# Patient Record
Sex: Female | Born: 1945 | Race: Black or African American | Hispanic: No | Marital: Married | Smoking: Current every day smoker
Health system: Southern US, Community
[De-identification: ages and names within clinical notes are randomized; demographics above are authoritative.]

## PROBLEM LIST (undated history)

## (undated) DIAGNOSIS — Z9981 Dependence on supplemental oxygen: Secondary | ICD-10-CM

## (undated) DIAGNOSIS — M1990 Unspecified osteoarthritis, unspecified site: Secondary | ICD-10-CM

## (undated) DIAGNOSIS — D849 Immunodeficiency, unspecified: Secondary | ICD-10-CM

## (undated) DIAGNOSIS — N183 Chronic kidney disease, stage 3 unspecified: Secondary | ICD-10-CM

## (undated) DIAGNOSIS — I10 Essential (primary) hypertension: Secondary | ICD-10-CM

## (undated) DIAGNOSIS — E119 Type 2 diabetes mellitus without complications: Secondary | ICD-10-CM

## (undated) DIAGNOSIS — IMO0001 Reserved for inherently not codable concepts without codable children: Secondary | ICD-10-CM

## (undated) DIAGNOSIS — J189 Pneumonia, unspecified organism: Secondary | ICD-10-CM

## (undated) DIAGNOSIS — I509 Heart failure, unspecified: Secondary | ICD-10-CM

## (undated) DIAGNOSIS — J449 Chronic obstructive pulmonary disease, unspecified: Secondary | ICD-10-CM

## (undated) DIAGNOSIS — E7800 Pure hypercholesterolemia, unspecified: Secondary | ICD-10-CM

## (undated) DIAGNOSIS — I5022 Chronic systolic (congestive) heart failure: Secondary | ICD-10-CM

## (undated) HISTORY — PX: BREAST SURGERY: SHX581

---

## 2010-06-19 ENCOUNTER — Ambulatory Visit: Payer: Self-pay | Admitting: Hematology & Oncology

## 2012-09-30 ENCOUNTER — Emergency Department (HOSPITAL_COMMUNITY)
Admission: EM | Admit: 2012-09-30 | Discharge: 2012-09-30 | Disposition: A | Discharge status: Home / Self Care | Payer: PRIVATE HEALTH INSURANCE | Attending: Emergency Medicine | Admitting: Emergency Medicine

## 2012-09-30 ENCOUNTER — Emergency Department (HOSPITAL_COMMUNITY): Payer: PRIVATE HEALTH INSURANCE

## 2012-09-30 ENCOUNTER — Encounter (HOSPITAL_COMMUNITY): Payer: Self-pay | Admitting: *Deleted

## 2012-09-30 DIAGNOSIS — M25551 Pain in right hip: Secondary | ICD-10-CM

## 2012-09-30 DIAGNOSIS — E119 Type 2 diabetes mellitus without complications: Secondary | ICD-10-CM | POA: Insufficient documentation

## 2012-09-30 DIAGNOSIS — M25511 Pain in right shoulder: Secondary | ICD-10-CM

## 2012-09-30 DIAGNOSIS — M25519 Pain in unspecified shoulder: Secondary | ICD-10-CM | POA: Insufficient documentation

## 2012-09-30 DIAGNOSIS — M19019 Primary osteoarthritis, unspecified shoulder: Secondary | ICD-10-CM | POA: Insufficient documentation

## 2012-09-30 DIAGNOSIS — M25559 Pain in unspecified hip: Secondary | ICD-10-CM | POA: Insufficient documentation

## 2012-09-30 DIAGNOSIS — R209 Unspecified disturbances of skin sensation: Secondary | ICD-10-CM | POA: Insufficient documentation

## 2012-09-30 DIAGNOSIS — M129 Arthropathy, unspecified: Secondary | ICD-10-CM | POA: Insufficient documentation

## 2012-09-30 DIAGNOSIS — J449 Chronic obstructive pulmonary disease, unspecified: Secondary | ICD-10-CM | POA: Insufficient documentation

## 2012-09-30 DIAGNOSIS — J4489 Other specified chronic obstructive pulmonary disease: Secondary | ICD-10-CM | POA: Insufficient documentation

## 2012-09-30 DIAGNOSIS — F17200 Nicotine dependence, unspecified, uncomplicated: Secondary | ICD-10-CM | POA: Insufficient documentation

## 2012-09-30 HISTORY — DX: Chronic obstructive pulmonary disease, unspecified: J44.9

## 2012-09-30 HISTORY — DX: Unspecified osteoarthritis, unspecified site: M19.90

## 2012-09-30 LAB — URINALYSIS, ROUTINE W REFLEX MICROSCOPIC
Bilirubin Urine: NEGATIVE
Glucose, UA: NEGATIVE mg/dL
Hgb urine dipstick: NEGATIVE
Ketones, ur: NEGATIVE mg/dL
Nitrite: NEGATIVE
Protein, ur: NEGATIVE mg/dL
Specific Gravity, Urine: 1.013 (ref 1.005–1.030)
Urobilinogen, UA: 0.2 mg/dL (ref 0.0–1.0)
pH: 6 (ref 5.0–8.0)

## 2012-09-30 LAB — URINE MICROSCOPIC-ADD ON

## 2012-09-30 LAB — POCT I-STAT, CHEM 8
Hemoglobin: 9.9 g/dL — ABNORMAL LOW (ref 12.0–15.0)
Sodium: 140 mEq/L (ref 135–145)
TCO2: 25 mmol/L (ref 0–100)

## 2012-09-30 MED ORDER — HYDROCODONE-ACETAMINOPHEN 5-325 MG PO TABS
1.0000 | ORAL_TABLET | Freq: Once | ORAL | Status: AC
  Administered 2012-09-30: 1 via ORAL
  Filled 2012-09-30: qty 1

## 2012-09-30 MED ORDER — OXYCODONE-ACETAMINOPHEN 5-325 MG PO TABS: 1.0000 | ORAL_TABLET | Freq: Four times a day (QID) | ORAL | Status: DC | PRN

## 2012-09-30 MED ORDER — PREDNISONE 50 MG PO TABS: 50.0000 mg | ORAL_TABLET | Freq: Every day | ORAL | Status: DC

## 2012-09-30 MED ORDER — KETOROLAC TROMETHAMINE 60 MG/2ML IM SOLN
30.0000 mg | Freq: Once | INTRAMUSCULAR | Status: AC
  Administered 2012-09-30: 30 mg via INTRAMUSCULAR
  Filled 2012-09-30: qty 2

## 2012-09-30 NOTE — ED Provider Notes (Signed)
History     CSN: [id number]  Arrival date & time 09/30/12  1110   First MD Initiated Contact with Patient 09/30/12 1154      Chief Complaint  Patient presents with  . Extremity Pain    R arm/R leg    (Consider location/radiation/quality/duration/timing/severity/associated sxs/prior treatment) HPI [name] [name] is a 67 year old with past history of arthritis who presents to the ED for worsening pain in her shoulders and hips, R > L. Pain has been ongoing for 4 months, but has been getting worse. The pain is constant, 10/10, interupts her sleep, and radiates down her arms and legs. She reports her right arm feels numb. She denies any tingling or burning sensation. She states the pain is so bad sometimes that she can't walk. The pain is worst with walking or lying down. She saw Dr. Greggory Stallion in [facility] in November and was given some pain medication that didn't provide any relief. She has taken some Goody's Powder packets, which provide some relief. She also used Bengay cream, with no relief. 1 week ago she saw Dr. Greggory Stallion again and was given a prescription for the same pain medication so she did not fill it. She doesn't know the name of the pain medication she was given. Patient reports she has "two types of arthritis" and upon questioning states that it is rheumatoid arthritis and osteoarthritis. She does not take any medications for these on a daily basis. Patient is a poor historian.  Past Medical History  Diagnosis Date  . Diabetes mellitus without complication   . COPD (chronic obstructive pulmonary disease)   . Arthritis     legs, shoulders    History reviewed. No pertinent past surgical history.  No family history on file.  History  Substance Use Topics  . Smoking status: Current Every Day Smoker -- 1.00 packs/day    Types: Cigarettes  . Smokeless tobacco: Not on file  . Alcohol Use: No    OB History   Grav Para Term Preterm Abortions TAB SAB Ect Mult Living                   Review of Systems  Allergies  Review of patient's allergies indicates no known allergies.  Home Medications   Current Outpatient Rx  Name  Route  Sig  Dispense  Refill  . Aspirin-Acetaminophen-Caffeine (GOODY HEADACHE PO)   Oral   Take 2 packets by mouth 2 (two) times daily as needed (for pain).           BP 152/69  Pulse 64  Temp(Src) 98.4 F (36.9 C) (Oral)  Resp 18  SpO2 94%  Physical Exam  Constitutional: She appears well-developed and well-nourished.  HENT:  Head: Normocephalic and atraumatic.  Poor dentition.  Neck: Normal range of motion. Neck supple.  Cardiovascular: Intact distal pulses.   Musculoskeletal:  Mild diffuse tenderness to palpation of shoulders B, iliac crest of hip B, and SI joint B. Full ROM with flexion, extension, internal and external rotation, and abduction and adduction of shoulders B. Full ROM with flexion, extension, abduction, adduction, and internal and external rotation of hips B. Mild pain elicited with all movement.    ED Course  Procedures (including critical care time)  Labs Reviewed  URINALYSIS, ROUTINE W REFLEX MICROSCOPIC  patient has full range of motion of her hips and knees, along with both shoulders.  Patient has pain when doing this but does not have any limitation.  I observed the patient  walking from the bed to the bathroom without difficulty.I spoke with her primary care doctor twice while she was here in the emergency room and he felt that if there is a reason for admission but otherwise have her followup in his office. Patient will be given pain medications for home   MDM          Carlyle Dolly, PA-C 09/30/12 1605

## 2012-09-30 NOTE — ED Notes (Signed)
Pt states she was released from hosp in [place] in November.  Pt has been experiencing R sided pain (hip joint, shoulder joint) since she left the hospital.  She called her Dr at the [facility] in [place] b/c her pain increased to the point that she has been unable to sleep for the past 2 nights.  The MD stated to come to [facility] so that he could admit her.  Pt does not know the name of there MD and appears to be angry with him.  Pt poor hx.

## 2012-09-30 NOTE — ED Provider Notes (Signed)
Medical screening examination/treatment/procedure(s) were conducted as a shared visit with non-physician practitioner(s) and myself.  I personally evaluated the patient during the encounter  4 months of R shoulder and hip pain.  No fever. Abdomen soft. 5/5 strength in bilateral lower extremities. Ankle plantar and dorsiflexion intact. Great toe extension intact bilaterally. +2 DP and PT pulses. +2 patellar reflexes bilaterally. Normal gait.   Glynn Octave, MD 09/30/12 [phone]

## 2012-09-30 NOTE — ED Notes (Signed)
Pt reports she was in [place] hospital and still experiencing pain in her right hip and shoulder. Pt reports she has been taking pain medicine at home but not getting much relief. Pt reports when she lays down the pain is worse. Pt reports she was dx with arthritis. Pt reports she called her PCP told her to come to [facility] and she would be admitted today, pt is unsure why she needs to be admitted, pt reports "it must of been serious though". Pt in nad, ambulatory to room, skin warm and dry, resp e/u.

## 2013-02-03 ENCOUNTER — Telehealth: Payer: Self-pay | Admitting: Hematology & Oncology

## 2013-02-03 NOTE — Telephone Encounter (Signed)
Tried to reach patient call will not go thru. Talked with Beth at referring she confirmed number and gave sister's number [phone] a man answered said he didn't know them. I called back and Beth acted indifferent and said those's were the numbers they had. I asked to talked to a manager and [place] Sink took my call and gave me another number [phone] I left message on that line to have pt call. I asked [place] Sink to please let her MD and the nurse Helmut Muster know that Dr. Myna Hidalgo wanted to see her 7-24 and her hgb is 6. [place] Sink said she would let them know.

## 2013-02-04 ENCOUNTER — Telehealth: Payer: Self-pay | Admitting: Hematology & Oncology

## 2013-02-04 NOTE — Telephone Encounter (Signed)
Pt called to schedule appointment said she couldn't come today. Dr. Myna Hidalgo has chart and will let me know when to schedule. Pt aware I will call her back.

## 2013-02-05 ENCOUNTER — Telehealth: Payer: Self-pay | Admitting: Hematology & Oncology

## 2013-02-05 NOTE — Telephone Encounter (Signed)
Pt aware of 8-1 appointment

## 2013-02-05 NOTE — Telephone Encounter (Signed)
Asked MD about appointment for pt he said he would let me know

## 2013-02-08 ENCOUNTER — Telehealth: Payer: Self-pay | Admitting: Hematology & Oncology

## 2013-02-08 NOTE — Telephone Encounter (Signed)
[name] [name] aware pt needs precert for iron

## 2013-02-12 ENCOUNTER — Ambulatory Visit: Payer: Medicare Other | Admitting: Hematology & Oncology

## 2013-02-12 ENCOUNTER — Telehealth: Payer: Self-pay | Admitting: Hematology & Oncology

## 2013-02-12 ENCOUNTER — Ambulatory Visit: Payer: Medicare Other

## 2013-02-12 ENCOUNTER — Other Ambulatory Visit: Payer: Medicare Other | Admitting: Lab

## 2013-02-12 NOTE — Telephone Encounter (Signed)
Pt called cx 8-1 rescheduled for 9-3 left message on nurse line at referring. Dr. Myna Hidalgo aware.

## 2013-03-17 ENCOUNTER — Encounter: Payer: Medicare Other | Admitting: Hematology & Oncology

## 2013-03-17 ENCOUNTER — Telehealth: Payer: Self-pay | Admitting: Hematology & Oncology

## 2013-03-17 ENCOUNTER — Ambulatory Visit: Payer: Medicare Other

## 2013-03-17 ENCOUNTER — Other Ambulatory Visit: Payer: Medicare Other | Admitting: Lab

## 2013-03-17 NOTE — Telephone Encounter (Signed)
Pt was no show again today. Per Dr. Myna Hidalgo do not reschedule pt

## 2013-03-30 ENCOUNTER — Telehealth: Payer: Self-pay | Admitting: Hematology & Oncology

## 2013-03-30 NOTE — Telephone Encounter (Signed)
Received call from Vivien Rota [phone] pt was in the background. She wanted to reschedule appointment, I told her per Dr. Myna Hidalgo I couldn't. I suggested she call the referring MD. She asked to speak to my manager, Alvino Chapel is aware and pt is aware she will call back within an hour.

## 2013-03-30 NOTE — Telephone Encounter (Signed)
Per MD go ahead and schedule appointment. Pt aware of 04-15-13 appointment

## 2013-04-12 NOTE — Progress Notes (Signed)
This encounter was created in error - please disregard.

## 2013-04-15 ENCOUNTER — Ambulatory Visit: Payer: Medicare Other

## 2013-04-15 ENCOUNTER — Other Ambulatory Visit: Payer: Medicare Other | Admitting: Lab

## 2013-04-15 ENCOUNTER — Encounter: Payer: Medicare Other | Admitting: Hematology & Oncology

## 2013-04-16 NOTE — Progress Notes (Signed)
This encounter was created in error - please disregard.

## 2013-04-21 ENCOUNTER — Encounter: Payer: Self-pay | Admitting: *Deleted

## 2013-04-21 ENCOUNTER — Telehealth: Payer: Self-pay | Admitting: Hematology & Oncology

## 2013-04-21 NOTE — Telephone Encounter (Signed)
Pt called wanting appointment transferred to RN

## 2013-04-21 NOTE — Progress Notes (Unsigned)
Pt's family called to ask that pt be scheduled to see Dr. Myna Hidalgo.  When chart was pulled, it was noted that she was a no show X3.  Reviewed with Dr. Myna Hidalgo and pt's family called and told that she would need to find another MD to see.  Family stated they would call Dr. Julio Sicks and he would find them another MD to see.  I did instruct family that if they feel like the patients health is urgent need of care they can take her to the ER.  Family did voice understanding.

## 2013-05-17 ENCOUNTER — Encounter: Payer: Self-pay | Admitting: Nurse Practitioner

## 2013-05-17 NOTE — Progress Notes (Signed)
Pt showed up at the office stating she received a letter that she had an appointment with Dr. Myna Hidalgo today. Pt does not have a scheduled appointment and last month pt was informed that she could not be seen at this office due to 3 no shows. At that time pt and family verbalized that information and was referred back to Dr. Julio Sicks. Pt states she went to his office today and he instructed her to come here today for an appointment. Spoke with pt and explained she could not be seen and did not have an appointment. She did remember the previous conversation regarding missed appointments, but insisted Dr. Cloyd Stagers Bonsu told her to come here. Pt was pleasant and I apologized for any inconvenience this may have caused her and was unsure were the miscommunication came in at. Pt verbalized understanding and left. Dr. Cloyd Stagers Bonsu's office was contacted and secretary verbalized understanding.

## 2013-12-21 ENCOUNTER — Institutional Professional Consult (permissible substitution): Payer: Medicare Other | Admitting: Emergency Medicine

## 2014-01-09 ENCOUNTER — Observation Stay (HOSPITAL_BASED_OUTPATIENT_CLINIC_OR_DEPARTMENT_OTHER)
Admission: EM | Admit: 2014-01-09 | Discharge: 2014-01-11 | Disposition: A | Discharge status: Home / Self Care | Payer: PRIVATE HEALTH INSURANCE | Attending: Internal Medicine | Admitting: Internal Medicine

## 2014-01-09 ENCOUNTER — Encounter (HOSPITAL_BASED_OUTPATIENT_CLINIC_OR_DEPARTMENT_OTHER): Payer: Self-pay | Admitting: Emergency Medicine

## 2014-01-09 ENCOUNTER — Emergency Department (HOSPITAL_BASED_OUTPATIENT_CLINIC_OR_DEPARTMENT_OTHER): Payer: PRIVATE HEALTH INSURANCE

## 2014-01-09 DIAGNOSIS — M25559 Pain in unspecified hip: Secondary | ICD-10-CM | POA: Insufficient documentation

## 2014-01-09 DIAGNOSIS — N179 Acute kidney failure, unspecified: Secondary | ICD-10-CM | POA: Diagnosis present

## 2014-01-09 DIAGNOSIS — G8929 Other chronic pain: Secondary | ICD-10-CM | POA: Diagnosis not present

## 2014-01-09 DIAGNOSIS — Z681 Body mass index (BMI) 19 or less, adult: Secondary | ICD-10-CM | POA: Diagnosis not present

## 2014-01-09 DIAGNOSIS — J449 Chronic obstructive pulmonary disease, unspecified: Secondary | ICD-10-CM | POA: Diagnosis not present

## 2014-01-09 DIAGNOSIS — I10 Essential (primary) hypertension: Secondary | ICD-10-CM | POA: Diagnosis not present

## 2014-01-09 DIAGNOSIS — I723 Aneurysm of iliac artery: Secondary | ICD-10-CM | POA: Insufficient documentation

## 2014-01-09 DIAGNOSIS — E119 Type 2 diabetes mellitus without complications: Secondary | ICD-10-CM

## 2014-01-09 DIAGNOSIS — M6281 Muscle weakness (generalized): Secondary | ICD-10-CM | POA: Insufficient documentation

## 2014-01-09 DIAGNOSIS — E559 Vitamin D deficiency, unspecified: Secondary | ICD-10-CM | POA: Diagnosis not present

## 2014-01-09 DIAGNOSIS — E43 Unspecified severe protein-calorie malnutrition: Secondary | ICD-10-CM | POA: Diagnosis not present

## 2014-01-09 DIAGNOSIS — N289 Disorder of kidney and ureter, unspecified: Secondary | ICD-10-CM | POA: Diagnosis not present

## 2014-01-09 DIAGNOSIS — D849 Immunodeficiency, unspecified: Secondary | ICD-10-CM | POA: Insufficient documentation

## 2014-01-09 DIAGNOSIS — J4489 Other specified chronic obstructive pulmonary disease: Secondary | ICD-10-CM | POA: Insufficient documentation

## 2014-01-09 DIAGNOSIS — M129 Arthropathy, unspecified: Secondary | ICD-10-CM | POA: Insufficient documentation

## 2014-01-09 DIAGNOSIS — K5900 Constipation, unspecified: Secondary | ICD-10-CM

## 2014-01-09 DIAGNOSIS — M25552 Pain in left hip: Secondary | ICD-10-CM

## 2014-01-09 DIAGNOSIS — R112 Nausea with vomiting, unspecified: Secondary | ICD-10-CM | POA: Insufficient documentation

## 2014-01-09 DIAGNOSIS — IMO0002 Reserved for concepts with insufficient information to code with codable children: Secondary | ICD-10-CM | POA: Diagnosis not present

## 2014-01-09 DIAGNOSIS — F17200 Nicotine dependence, unspecified, uncomplicated: Secondary | ICD-10-CM | POA: Insufficient documentation

## 2014-01-09 DIAGNOSIS — M549 Dorsalgia, unspecified: Secondary | ICD-10-CM | POA: Diagnosis not present

## 2014-01-09 DIAGNOSIS — M1990 Unspecified osteoarthritis, unspecified site: Secondary | ICD-10-CM

## 2014-01-09 DIAGNOSIS — R109 Unspecified abdominal pain: Secondary | ICD-10-CM

## 2014-01-09 DIAGNOSIS — Z720 Tobacco use: Secondary | ICD-10-CM | POA: Diagnosis present

## 2014-01-09 HISTORY — DX: Immunodeficiency, unspecified: D84.9

## 2014-01-09 HISTORY — DX: Essential (primary) hypertension: I10

## 2014-01-09 LAB — COMPREHENSIVE METABOLIC PANEL
ALBUMIN: 3.9 g/dL (ref 3.5–5.2)
ALK PHOS: 107 U/L (ref 39–117)
ALT: 20 U/L (ref 0–35)
AST: 81 U/L — ABNORMAL HIGH (ref 0–37)
BILIRUBIN TOTAL: 0.4 mg/dL (ref 0.3–1.2)
BUN: 30 mg/dL — AB (ref 6–23)
CO2: 20 mEq/L (ref 19–32)
CREATININE: 1.3 mg/dL — AB (ref 0.50–1.10)
Calcium: 10.3 mg/dL (ref 8.4–10.5)
Chloride: 98 mEq/L (ref 96–112)
GFR calc non Af Amer: 41 mL/min — ABNORMAL LOW (ref >90)
GFR, EST AFRICAN AMERICAN: 47 mL/min — AB (ref >90)
GLUCOSE: 144 mg/dL — AB (ref 70–99)
Potassium: 5 mEq/L (ref 3.7–5.3)
Sodium: 138 mEq/L (ref 137–147)
TOTAL PROTEIN: 8.5 g/dL — AB (ref 6.0–8.3)

## 2014-01-09 LAB — CBC WITH DIFFERENTIAL/PLATELET
BASOS ABS: 0 10*3/uL (ref 0.0–0.1)
BASOS PCT: 0 % (ref 0–1)
EOS PCT: 0 % (ref 0–5)
Eosinophils Absolute: 0 10*3/uL (ref 0.0–0.7)
HEMATOCRIT: 52.1 % — AB (ref 36.0–46.0)
HEMOGLOBIN: 16.6 g/dL — AB (ref 12.0–15.0)
Lymphocytes Relative: 8 % — ABNORMAL LOW (ref 12–46)
Lymphs Abs: 0.8 10*3/uL (ref 0.7–4.0)
MCH: 26.1 pg (ref 26.0–34.0)
MCHC: 31.9 g/dL (ref 30.0–36.0)
MCV: 82 fL (ref 78.0–100.0)
MONO ABS: 0.8 10*3/uL (ref 0.1–1.0)
MONOS PCT: 8 % (ref 3–12)
Neutro Abs: 8.4 10*3/uL — ABNORMAL HIGH (ref 1.7–7.7)
Neutrophils Relative %: 84 % — ABNORMAL HIGH (ref 43–77)
Platelets: 249 10*3/uL (ref 150–400)
RBC: 6.35 MIL/uL — ABNORMAL HIGH (ref 3.87–5.11)
RDW: 21.4 % — AB (ref 11.5–15.5)
WBC: 10 10*3/uL (ref 4.0–10.5)

## 2014-01-09 LAB — URINALYSIS, ROUTINE W REFLEX MICROSCOPIC
Glucose, UA: NEGATIVE mg/dL
Hgb urine dipstick: NEGATIVE
KETONES UR: NEGATIVE mg/dL
NITRITE: NEGATIVE
PH: 5 (ref 5.0–8.0)
Protein, ur: 30 mg/dL — AB
SPECIFIC GRAVITY, URINE: 1.018 (ref 1.005–1.030)
UROBILINOGEN UA: 0.2 mg/dL (ref 0.0–1.0)

## 2014-01-09 LAB — URINE MICROSCOPIC-ADD ON

## 2014-01-09 LAB — ETHANOL: Alcohol, Ethyl (B): <11 mg/dL (ref 0–11)

## 2014-01-09 LAB — LIPASE, BLOOD: LIPASE: 176 U/L — AB (ref 11–59)

## 2014-01-09 MED ORDER — SODIUM CHLORIDE 0.9 % IV BOLUS (SEPSIS)
500.0000 mL | Freq: Once | INTRAVENOUS | Status: AC
Start: 2014-01-09 — End: 2014-01-09
  Administered 2014-01-09: 500 mL via INTRAVENOUS

## 2014-01-09 MED ORDER — HYDROMORPHONE HCL PF 1 MG/ML IJ SOLN
0.5000 mg | Freq: Once | INTRAMUSCULAR | Status: AC
  Administered 2014-01-09: 20:00:00 via INTRAVENOUS
  Filled 2014-01-09: qty 1

## 2014-01-09 MED ORDER — ONDANSETRON HCL 4 MG/2ML IJ SOLN
4.0000 mg | Freq: Once | INTRAMUSCULAR | Status: AC
  Administered 2014-01-09: 4 mg via INTRAVENOUS
  Filled 2014-01-09: qty 2

## 2014-01-09 NOTE — ED Notes (Signed)
Patient transported to CT 

## 2014-01-09 NOTE — ED Provider Notes (Signed)
CSN: [id number]     Arrival date & time 01/09/14  1828 History   First MD Initiated Contact with Patient 01/09/14 1848     Chief Complaint  Patient presents with  . Back Pain     (Consider location/radiation/quality/duration/timing/severity/associated sxs/prior Treatment) Patient is a 68 y.o. female presenting with back pain.  Back Pain Associated symptoms: abdominal pain, fever and numbness (Chronic- toes)   Associated symptoms: no chest pain, no dysuria and no pelvic pain    Aundrea Fruhling is a 68 y.o. female who presents to the [facility] [place] ED with the complain of Back pain.  Patient is on oxygen at home as needed.  Patient is a poor historian.  Some of the history comes from her daughter who accompanied her today.   The patient has chronic low lumbar back pain for which she takes gabapentin. The past few days the patient states she has felt feverish and nauseated and her back pain has increased.  She describes the back pain as achy and upper lumbar pain.  She mentions the pain wraps around her left side. Yesterday her appetite has decreased and she began to vomit when she tried to eat.  Her daughter mentions that she appears more tired than usual.  Patient denies any recent trauma or increase in activity.  Past Medical History  Diagnosis Date  . Diabetes mellitus without complication   . COPD (chronic obstructive pulmonary disease)   . Arthritis     legs, shoulders  . Immune deficiency disorder   . Hypertension   . Renal disorder    History reviewed. No pertinent past surgical history. No family history on file. History  Substance Use Topics  . Smoking status: Current Every Day Smoker -- 1.00 packs/day    Types: Cigarettes  . Smokeless tobacco: Not on file  . Alcohol Use: No   OB History   Grav Para Term Preterm Abortions TAB SAB Ect Mult Living                 Review of Systems  Constitutional: Positive for fever, chills, appetite change and fatigue. Negative for diaphoresis.   HENT: Negative.   Eyes: Negative.   Respiratory: Negative for cough, chest tightness and shortness of breath.   Cardiovascular: Negative for chest pain and leg swelling.  Gastrointestinal: Positive for nausea, vomiting, abdominal pain, constipation and abdominal distention.  Genitourinary: Positive for flank pain (L side). Negative for dysuria, urgency, vaginal discharge, difficulty urinating, vaginal pain and pelvic pain.  Musculoskeletal: Positive for back pain.  Skin: Negative.  Negative for wound.  Neurological: Positive for numbness (Chronic- toes). Negative for dizziness and light-headedness.      Allergies  Review of patient's allergies indicates no known allergies.  Home Medications   Prior to Admission medications   Medication Sig Start Date End Date Taking? Authorizing Provider  carvedilol (COREG) 12.5 MG tablet Take 12.5 mg by mouth 2 (two) times daily with a meal.   Yes Historical Provider, MD  furosemide (LASIX) 40 MG tablet Take 40 mg by mouth.   Yes Historical Provider, MD  gabapentin (NEURONTIN) 300 MG capsule Take 300 mg by mouth 3 (three) times daily.   Yes Historical Provider, MD  lisinopril (PRINIVIL,ZESTRIL) 5 MG tablet Take 5 mg by mouth daily.   Yes Historical Provider, MD  mirtazapine (REMERON) 30 MG tablet Take 30 mg by mouth at bedtime.   Yes Historical Provider, MD  potassium chloride (K-DUR,KLOR-CON) 10 MEQ tablet Take 10 mEq by mouth 2 (  two) times daily.   Yes Historical Provider, MD  Aspirin-Acetaminophen-Caffeine (GOODY HEADACHE PO) Take 2 packets by mouth 2 (two) times daily as needed (for pain).    Historical Provider, MD  oxyCODONE-acetaminophen (PERCOCET/ROXICET) 5-325 MG per tablet Take 1 tablet by mouth every 6 (six) hours as needed for pain. 09/30/12   Jamesetta Orleanshristopher W Lawyer, PA-C  predniSONE (DELTASONE) 50 MG tablet Take 1 tablet (50 mg total) by mouth daily. 09/30/12   Jamesetta Orleanshristopher W Lawyer, PA-C   BP 129/71  Temp(Src) 97.5 F (36.4 C) (Oral)   Resp 24  Ht 5\' 3"  (1.6 m)  Wt 115 lb (52.164 kg)  BMI 20.38 kg/m2  SpO2 94% Physical Exam  Vitals reviewed. Constitutional: She is oriented to person, place, and time. No distress.  Eyes: Conjunctivae are normal. Pupils are equal, round, and reactive to light.  Cardiovascular: Normal rate, regular rhythm and normal heart sounds.   No murmur heard. Pulmonary/Chest: Effort normal and breath sounds normal. No respiratory distress. She has no wheezes.  Abdominal: Soft. Bowel sounds are normal. She exhibits no distension. There is tenderness. There is CVA tenderness. There is no rebound and no guarding.  Neurological: She is alert and oriented to person, place, and time.  Skin: Skin is warm and dry. No rash noted. She is not diaphoretic. No erythema.  Psychiatric: Her speech is slurred. She is slowed.    ED Course  Procedures (including critical care time) Labs Review Labs Reviewed  URINE CULTURE  CBC WITH DIFFERENTIAL  COMPREHENSIVE METABOLIC PANEL  LIPASE, BLOOD  URINALYSIS, ROUTINE W REFLEX MICROSCOPIC    Imaging Review No results found.   EKG Interpretation None      MDM   Final diagnoses:  None    1. Abdominal pain 2. Renal insufficiency 3. Elevated Lipase  The patient is a poor historian presenting with abdominal pain and with lab evidence of renal insufficiency and elevated lipase. Discussed admission with [facility] who accepts the patient for transfer.     Arnoldo HookerShari A Maddelynn Moosman, PA-C 01/18/14 [phone]

## 2014-01-09 NOTE — ED Notes (Signed)
Patient states that she can't sleep, coughing, weakness, constipated,

## 2014-01-09 NOTE — ED Notes (Signed)
Assumed care of patient from Katie, RN.

## 2014-01-09 NOTE — ED Notes (Signed)
Pt reports back pain, constipation and decreased O2 x 2 weeks.  Pt is a poor historian.  Denies knowing how much oxygen she is on at home.  Pt reports last BM was on Monday-states that laxatives 'little orange pills' aren't working.

## 2014-01-10 ENCOUNTER — Observation Stay (HOSPITAL_COMMUNITY): Payer: PRIVATE HEALTH INSURANCE

## 2014-01-10 ENCOUNTER — Encounter (HOSPITAL_COMMUNITY): Payer: Self-pay | Admitting: Internal Medicine

## 2014-01-10 DIAGNOSIS — Z720 Tobacco use: Secondary | ICD-10-CM | POA: Diagnosis present

## 2014-01-10 DIAGNOSIS — J449 Chronic obstructive pulmonary disease, unspecified: Secondary | ICD-10-CM | POA: Diagnosis present

## 2014-01-10 DIAGNOSIS — N179 Acute kidney failure, unspecified: Secondary | ICD-10-CM | POA: Diagnosis present

## 2014-01-10 DIAGNOSIS — E119 Type 2 diabetes mellitus without complications: Secondary | ICD-10-CM | POA: Diagnosis present

## 2014-01-10 DIAGNOSIS — M25552 Pain in left hip: Secondary | ICD-10-CM | POA: Diagnosis present

## 2014-01-10 DIAGNOSIS — K5900 Constipation, unspecified: Secondary | ICD-10-CM | POA: Diagnosis present

## 2014-01-10 DIAGNOSIS — I10 Essential (primary) hypertension: Secondary | ICD-10-CM | POA: Diagnosis present

## 2014-01-10 DIAGNOSIS — M1990 Unspecified osteoarthritis, unspecified site: Secondary | ICD-10-CM | POA: Diagnosis present

## 2014-01-10 LAB — TSH: TSH: 0.324 u[IU]/mL — ABNORMAL LOW (ref 0.350–4.500)

## 2014-01-10 LAB — CBC
HEMATOCRIT: 47.4 % — AB (ref 36.0–46.0)
Hemoglobin: 14.6 g/dL (ref 12.0–15.0)
MCH: 26.4 pg (ref 26.0–34.0)
MCHC: 30.8 g/dL (ref 30.0–36.0)
MCV: 85.6 fL (ref 78.0–100.0)
PLATELETS: 203 10*3/uL (ref 150–400)
RBC: 5.54 MIL/uL — ABNORMAL HIGH (ref 3.87–5.11)
RDW: 19 % — ABNORMAL HIGH (ref 11.5–15.5)
WBC: 11.7 10*3/uL — AB (ref 4.0–10.5)

## 2014-01-10 LAB — GLUCOSE, CAPILLARY
Glucose-Capillary: 130 mg/dL — ABNORMAL HIGH (ref 70–99)
Glucose-Capillary: 163 mg/dL — ABNORMAL HIGH (ref 70–99)
Glucose-Capillary: 218 mg/dL — ABNORMAL HIGH (ref 70–99)
Glucose-Capillary: 166 mg/dL — ABNORMAL HIGH (ref 70–99)

## 2014-01-10 LAB — BASIC METABOLIC PANEL
BUN: 28 mg/dL — AB (ref 6–23)
CHLORIDE: 100 meq/L (ref 96–112)
CO2: 23 mEq/L (ref 19–32)
CREATININE: 1.23 mg/dL — AB (ref 0.50–1.10)
Calcium: 9.3 mg/dL (ref 8.4–10.5)
GFR calc Af Amer: 51 mL/min — ABNORMAL LOW (ref >90)
GFR, EST NON AFRICAN AMERICAN: 44 mL/min — AB (ref >90)
Glucose, Bld: 120 mg/dL — ABNORMAL HIGH (ref 70–99)
Potassium: 4.1 mEq/L (ref 3.7–5.3)
Sodium: 139 mEq/L (ref 137–147)

## 2014-01-10 LAB — HEMOGLOBIN A1C
Hgb A1c MFr Bld: 6 % — ABNORMAL HIGH (ref <5.7)
Hgb A1c MFr Bld: 6 % — ABNORMAL HIGH (ref <5.7)
MEAN PLASMA GLUCOSE: 126 mg/dL — AB (ref <117)
Mean Plasma Glucose: 126 mg/dL — ABNORMAL HIGH (ref <117)

## 2014-01-10 LAB — SEDIMENTATION RATE: SED RATE: 10 mm/h (ref 0–22)

## 2014-01-10 LAB — T4, FREE: Free T4: 1.26 ng/dL (ref 0.80–1.80)

## 2014-01-10 MED ORDER — ENOXAPARIN SODIUM 30 MG/0.3ML ~~LOC~~ SOLN
30.0000 mg | SUBCUTANEOUS | Status: DC
  Administered 2014-01-10 – 2014-01-11 (×2): 30 mg via SUBCUTANEOUS
  Filled 2014-01-10 (×2): qty 0.3

## 2014-01-10 MED ORDER — ONDANSETRON HCL 4 MG PO TABS: 4.0000 mg | ORAL_TABLET | Freq: Four times a day (QID) | ORAL | Status: DC | PRN

## 2014-01-10 MED ORDER — INSULIN ASPART 100 UNIT/ML ~~LOC~~ SOLN
0.0000 [IU] | Freq: Three times a day (TID) | SUBCUTANEOUS | Status: DC
  Administered 2014-01-10: 1 [IU] via SUBCUTANEOUS
  Administered 2014-01-10: 3 [IU] via SUBCUTANEOUS

## 2014-01-10 MED ORDER — ONDANSETRON HCL 4 MG/2ML IJ SOLN: 4.0000 mg | Freq: Three times a day (TID) | INTRAMUSCULAR | Status: DC | PRN

## 2014-01-10 MED ORDER — FLEET ENEMA 7-19 GM/118ML RE ENEM
1.0000 | ENEMA | Freq: Once | RECTAL | Status: AC | PRN
  Filled 2014-01-10: qty 1

## 2014-01-10 MED ORDER — INSULIN ASPART 100 UNIT/ML ~~LOC~~ SOLN: 0.0000 [IU] | Freq: Every day | SUBCUTANEOUS | Status: DC

## 2014-01-10 MED ORDER — ENSURE COMPLETE PO LIQD
237.0000 mL | Freq: Two times a day (BID) | ORAL | Status: DC
  Administered 2014-01-11: 237 mL via ORAL

## 2014-01-10 MED ORDER — ACETAMINOPHEN 325 MG PO TABS
650.0000 mg | ORAL_TABLET | Freq: Four times a day (QID) | ORAL | Status: DC | PRN
Start: 2014-01-10 — End: 2014-01-11

## 2014-01-10 MED ORDER — SENNOSIDES-DOCUSATE SODIUM 8.6-50 MG PO TABS
1.0000 | ORAL_TABLET | Freq: Two times a day (BID) | ORAL | Status: DC
  Administered 2014-01-10 – 2014-01-11 (×3): 1 via ORAL
  Filled 2014-01-10 (×5): qty 1

## 2014-01-10 MED ORDER — POLYETHYLENE GLYCOL 3350 17 G PO PACK
17.0000 g | PACK | Freq: Two times a day (BID) | ORAL | Status: DC
  Administered 2014-01-10 – 2014-01-11 (×3): 17 g via ORAL
  Filled 2014-01-10 (×4): qty 1

## 2014-01-10 MED ORDER — OXYCODONE-ACETAMINOPHEN 5-325 MG PO TABS: 1.0000 | ORAL_TABLET | Freq: Four times a day (QID) | ORAL | Status: DC | PRN

## 2014-01-10 MED ORDER — ONDANSETRON HCL 4 MG/2ML IJ SOLN: 4.0000 mg | Freq: Four times a day (QID) | INTRAMUSCULAR | Status: DC | PRN

## 2014-01-10 MED ORDER — CARVEDILOL 12.5 MG PO TABS
12.5000 mg | ORAL_TABLET | Freq: Two times a day (BID) | ORAL | Status: DC
Start: 2014-01-10 — End: 2014-01-11
  Administered 2014-01-10 – 2014-01-11 (×3): 12.5 mg via ORAL
  Filled 2014-01-10 (×5): qty 1

## 2014-01-10 MED ORDER — GABAPENTIN 300 MG PO CAPS
300.0000 mg | ORAL_CAPSULE | Freq: Three times a day (TID) | ORAL | Status: DC
  Administered 2014-01-10 – 2014-01-11 (×4): 300 mg via ORAL
  Filled 2014-01-10 (×6): qty 1

## 2014-01-10 MED ORDER — PREDNISONE 50 MG PO TABS
50.0000 mg | ORAL_TABLET | Freq: Every day | ORAL | Status: DC
  Administered 2014-01-10 – 2014-01-11 (×2): 50 mg via ORAL
  Filled 2014-01-10 (×2): qty 1

## 2014-01-10 MED ORDER — MIRTAZAPINE 30 MG PO TABS
30.0000 mg | ORAL_TABLET | Freq: Every day | ORAL | Status: DC
  Administered 2014-01-10: 30 mg via ORAL
  Filled 2014-01-10 (×2): qty 1

## 2014-01-10 MED ORDER — ACETAMINOPHEN 650 MG RE SUPP: 650.0000 mg | Freq: Four times a day (QID) | RECTAL | Status: DC | PRN

## 2014-01-10 MED ORDER — SODIUM CHLORIDE 0.9 % IV SOLN
INTRAVENOUS | Status: AC
  Administered 2014-01-10: 03:00:00 via INTRAVENOUS

## 2014-01-10 MED ORDER — BISACODYL 10 MG RE SUPP
10.0000 mg | Freq: Every day | RECTAL | Status: DC | PRN
  Administered 2014-01-10: 10 mg via RECTAL
  Filled 2014-01-10: qty 1

## 2014-01-10 NOTE — Progress Notes (Signed)
INITIAL NUTRITION ASSESSMENT  DOCUMENTATION CODES Per approved criteria  -Severe malnutrition in the context of chronic illness -Underweight   INTERVENTION: Add Ensure Complete po BID, each supplement provides 350 kcal and 13 grams of protein. RD to continue to follow nutrition care plan.  NUTRITION DIAGNOSIS: Increased nutrient needs related to repletion and COPD as evidenced by estimated needs.   Goal: Intake to meet >90% of estimated nutrition needs.  Monitor:  weight trends, lab trends, I/O's, PO intake, supplement tolerance  Reason for Assessment: Malnutrition Screening Tool  68 y.o. female  Admitting Dx: Left hip pain  ASSESSMENT: PMHx significant for DM, COPD, arthritis, immune deficiency disorder, HTN, hx of renal disorder and chronic steroid use. Admitted with L hip pain and back, also complains of n/v/c. Work-up reveals AKI.  Patient is a poor historian. She tells me usual body weight is 125 lb and that she currently weighs 115 lb, however per our records she currently weighs 100 lb. Currently, her weight and height put her in the underweight category, as her BMI is 17.8.  Pt unable to tell me how much she eats at home, other than that she only eats 1 meal daily.  Nutrition Focused Physical Exam:  Subcutaneous Fat:  Orbital Region: WNL Upper Arm Region: moderate depletion Thoracic and Lumbar Region: severe depletion  Muscle:  Temple Region: severe depletion Clavicle Bone Region: moderate depletion Clavicle and Acromion Bone Region: moderate depletion Scapular Bone Region: n/a Dorsal Hand: moderate to severe depletion Patellar Region: moderate depletion Anterior Thigh Region: n/an Posterior Calf Region: n/a  Edema: none  Pt meets criteria for sever MALNUTRITION in the context of chronic illness as evidenced by severe muscle and fat mass loss.  Sodium WNL Potassium WNL  Height: Ht Readings from Last 1 Encounters:  01/10/14 5\' 3"  (1.6 m)     Weight: Wt Readings from Last 1 Encounters:  01/10/14 100 lb 5 oz (45.5 kg)    Ideal Body Weight: 115 lb  % Ideal Body Weight: 87%  Wt Readings from Last 10 Encounters:  01/10/14 100 lb 5 oz (45.5 kg)    Usual Body Weight: 125 lb (per pt but "several years ago")  % Usual Body Weight: n/a  BMI:  Body mass index is 17.77 kg/(m^2). Underweight  Estimated Nutritional Needs: Kcal: 1400 - 1600 Protein: at least 55 g Fluid: at least 1.5 liters  Skin: intact  Diet Order: Carb Control  EDUCATION NEEDS: -No education needs identified at this time  No intake or output data in the 24 hours ending 01/10/14 1443  Last BM: 6/22  Labs:   Recent Labs Lab 01/09/14 2000 01/10/14 0405  NA 138 139  K 5.0 4.1  CL 98 100  CO2 20 23  BUN 30* 28*  CREATININE 1.30* 1.23*  CALCIUM 10.3 9.3  GLUCOSE 144* 120*    CBG (last 3)   Recent Labs  01/10/14 0748  GLUCAP 130*    Scheduled Meds: . carvedilol  12.5 mg Oral BID WC  . enoxaparin (LOVENOX) injection  30 mg Subcutaneous Q24H  . gabapentin  300 mg Oral TID  . insulin aspart  0-5 Units Subcutaneous QHS  . insulin aspart  0-9 Units Subcutaneous TID WC  . mirtazapine  30 mg Oral QHS  . polyethylene glycol  17 g Oral BID  . predniSONE  50 mg Oral Daily  . senna-docusate  1 tablet Oral BID    Continuous Infusions: . sodium chloride 75 mL/hr at 01/10/14 0253  Past Medical History  Diagnosis Date  . Diabetes mellitus without complication   . COPD (chronic obstructive pulmonary disease)   . Arthritis     legs, shoulders  . Immune deficiency disorder   . Hypertension   . Renal disorder     History reviewed. No pertinent past surgical history.  Jarold MottoSamantha Worley MS, RD, LDN Inpatient Registered Dietitian Pager: [phone] After-hours pager: [phone]

## 2014-01-10 NOTE — H&P (Signed)
Patient's PCP: No primary [name] on file. Patient's does not know who per PCP is at this time.  Chief Complaint: Left hip pain/back, constipation, and weakness in her legs.  History of Present Illness: [name] [name] is a 68 y.o. African American female with history of diabetes appears to be not on any diabetic medications, COPD uncertain if patient is on home oxygen, arthritis, immune deficiency disorder of unclear etiology, hypertension, history of renal disorder of unclear etiology, and chronic steroid use of unclear etiology who presents with the above complaints.  Patient is an extremely poor historian, she indicates that she has had weakness in her legs about a week ago.  She also has been having left hip pain/back in the same period of time.  She has been constipated again in the same period of time.  She had nausea and vomiting once yesterday as a result she presented to the emergency department for further evaluation.  Patient's labs were checked and her creatinine was 1.3, hospitalist service was asked to admit the patient for acute kidney injury.  Patient denies any recent fevers, chills, chest pain, shortness of breath, diarrhea, headaches or vision changes.  Review of Systems: All systems reviewed with the patient and positive as per history of present illness, otherwise all other systems are negative.  Past Medical History  Diagnosis Date  . Diabetes mellitus without complication   . COPD (chronic obstructive pulmonary disease)   . Arthritis     legs, shoulders  . Immune deficiency disorder   . Hypertension   . Renal disorder    History reviewed. No pertinent past surgical history. Family History  Problem Relation Age of Onset  . Other Mother     Per patient died from old age.  . Other Father     Per patient died from old age.   History   Social History  . Marital Status: Married    Spouse Name: N/A    Number of Children: N/A  . Years of Education: N/A    Occupational History  . Not on file.   Social History Main Topics  . Smoking status: Current Every Day Smoker -- 0.50 packs/day    Types: Cigarettes  . Smokeless tobacco: Not on file  . Alcohol Use: No  . Drug Use: Yes    Special: Marijuana  . Sexual Activity: Not on file   Other Topics Concern  . Not on file   Social History Narrative  . No narrative on file   Allergies: Review of patient's allergies indicates no known allergies.  Home Meds: Prior to Admission medications   Medication Sig Start Date End Date Taking? Authorizing Johanan Skorupski  carvedilol (COREG) 12.5 MG tablet Take 12.5 mg by mouth 2 (two) times daily with a meal.   Yes Historical Talani Brazee, MD  furosemide (LASIX) 40 MG tablet Take 40 mg by mouth.   Yes Historical Emmalynn Pinkham, MD  gabapentin (NEURONTIN) 300 MG capsule Take 300 mg by mouth 3 (three) times daily.   Yes Historical Adaley Kiene, MD  lisinopril (PRINIVIL,ZESTRIL) 5 MG tablet Take 5 mg by mouth daily.   Yes Historical Alva Kuenzel, MD  mirtazapine (REMERON) 30 MG tablet Take 30 mg by mouth at bedtime.   Yes Historical Naylin Burkle, MD  potassium chloride (K-DUR,KLOR-CON) 10 MEQ tablet Take 10 mEq by mouth 2 (two) times daily.   Yes Historical Canton Yearby, MD  Aspirin-Acetaminophen-Caffeine (GOODY HEADACHE PO) Take 2 packets by mouth 2 (two) times daily as needed (for pain).    Historical Samanda Buske,  MD  oxyCODONE-acetaminophen (PERCOCET/ROXICET) 5-325 MG per tablet Take 1 tablet by mouth every 6 (six) hours as needed for pain. 09/30/12   Jamesetta Orleanshristopher W Lawyer, PA-C  predniSONE (DELTASONE) 50 MG tablet Take 1 tablet (50 mg total) by mouth daily. 09/30/12   Carlyle Dollyhristopher W Lawyer, PA-C    Physical Exam: Blood pressure 129/85, pulse 80, temperature 98.4 F (36.9 C), temperature source Oral, resp. rate 16, height 5\' 3"  (1.6 m), weight 45.5 kg (100 lb 5 oz), SpO2 91.00%. General: Awake, Oriented x3, No acute distress. HEENT: EOMI, Moist mucous membranes Neck: Supple CV: S1 and  S2 Lungs: Clear to ascultation bilaterally Abdomen: Soft, Nontender, Nondistended, +bowel sounds. Ext: Good pulses. Trace edema. No clubbing or cyanosis noted.  Good right and left hip range of motion without pain. Neuro: Cranial Nerves II-XII grossly intact. Has 5/5 motor strength in upper and lower extremities.  Lab results:  Recent Labs  01/09/14 2000  NA 138  K 5.0  CL 98  CO2 20  GLUCOSE 144*  BUN 30*  CREATININE 1.30*  CALCIUM 10.3    Recent Labs  01/09/14 2000  AST 81*  ALT 20  ALKPHOS 107  BILITOT 0.4  PROT 8.5*  ALBUMIN 3.9    Recent Labs  01/09/14 2000  LIPASE 176*    Recent Labs  01/09/14 2000  WBC 10.0  NEUTROABS 8.4*  HGB 16.6*  HCT 52.1*  MCV 82.0  PLT 249   No results found for this basename: CKTOTAL, CKMB, CKMBINDEX, TROPONINI,  in the last 72 hours No components found with this basename: POCBNP,  No results found for this basename: DDIMER,  in the last 72 hours No results found for this basename: HGBA1C,  in the last 72 hours No results found for this basename: CHOL, HDL, LDLCALC, TRIG, CHOLHDL, LDLDIRECT,  in the last 72 hours No results found for this basename: TSH, T4TOTAL, FREET3, T3FREE, THYROIDAB,  in the last 72 hours No results found for this basename: VITAMINB12, FOLATE, FERRITIN, TIBC, IRON, RETICCTPCT,  in the last 72 hours Imaging results:  Ct Abdomen Pelvis Wo Contrast  01/10/2014   CLINICAL DATA:  Abdominal pain  EXAM: CT ABDOMEN AND PELVIS WITHOUT CONTRAST  TECHNIQUE: Multidetector CT imaging of the abdomen and pelvis was performed following the standard protocol without IV contrast.  COMPARISON:  04/11/2012  FINDINGS: BODY WALL: Unremarkable.  LOWER CHEST: Moderate cardiomegaly. Diffuse coronary artery atherosclerosis. Subpleural lucencies in the bilateral lungs favor paraseptal emphysema over fibrosis.  ABDOMEN/PELVIS:  Liver: No focal abnormality.  Biliary: Homogeneously high density the gallbladder, likely sludge. There is  no discrete calcified stone or gallbladder distention.  Pancreas: Unremarkable.  Spleen: Unremarkable.  Adrenals: Bilateral thickening of the adrenal glands without measurable nodule, stable from 2013.  Kidneys and ureters: Extensive arterial calcification in the bilateral renal hila. There is symmetric perinephric edema. No hydronephrosis.  Bladder: Unremarkable.  Reproductive: Multiple uterine fibroids with dystrophic calcification. The largest is exophytic from the fundus measuring 3.3 cm.  Bowel: No obstruction. High-density material within the non dilated appendix.  Retroperitoneum: No mass or adenopathy.  Peritoneum: No ascites or pneumoperitoneum.  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 No indication of aneurysm rupture. The aortic terminus is also focally ectatic, measuring 2.4 cm in diameter.  OSSEOUS: No acute abnormalities. Mixed sclerosis and lucency in the left iliac crest is stable from 2013, consistent with benign process. There is multilevel lumbar degenerative disc disease. No fracture or destructive process  to explain back pain.  IMPRESSION: 1. No acute intra-abdominal findings. 2. 2.5 cm right common iliac artery aneurysm, size stable from 2013. 3. Additional incidental findings are noted above.   Electronically Signed   By: Tiburcio Pea M.D.   On: 01/10/2014 00:00   Assessment & Plan by Problem: Left hip pain/back pain Patient with history of chronic back pain.  Unclear etiology for the cause for her left hip/flank/back pain.  CT of abdomen and pelvis noted.  Uncertain if constipation is causing her symptoms.  Patient has good range of motion in her hip and legs.  Start the patient on stool softeners to determine if symptoms will improve.  Check TSH and vitamin B 12 levels in the morning.  Acute renal injury Patient with history of renal disorder of unclear etiology.  Suspect is likely due to dehydration from prerenal etiology or  from nausea and vomiting.  Gently hydrate the patient on IV fluids.  Hold patient's diuretics.  CT does not show any signs of obstruction.  Diabetes Diabetic diet.  Sliding scale insulin.  Patient does not appear to be on any diabetic medications at home.  Check hemoglobin A1c in the morning.  Generalized weakness Request PT and OT evaluation.  Neuropathy? Continue gabapentin.  Uncertain if patient's weakness she is referring to maybe due to neuropathy.  Tobacco abuse Counseled on cessation.  Constipation Started the patient on bowel regimen.  Will also hydrate the patient on IV fluids.  Arthritis Continue home medications including pain medications.  Hypertension Continue home carvedilol.  Holding home diuretics for now.  COPD with possible chronic respiratory failure Uncertain if patient is on home oxygen.  Stable on exam.  Chronic steroid use in a patient with history of immunodeficiency disorder of unclear etiology Unclear etiology why patient is on steroids.  Patient is a poor historian.  Request records from her primary care physician.  Continue steroids for now.  CODE STATUS Full code.  Disposition Admit the patient as observation to the medical bed.  Time spent on admission, talking to the patient, and coordinating care was: 50 mins.  REDDY,SRIKAR A, MD 01/10/2014, 2:27 AM

## 2014-01-10 NOTE — Evaluation (Signed)
Occupational Therapy Evaluation Patient Details Name: [name] [name] MRN: [id number] DOB: [birth year] Today's Date: 01/10/2014    History of Present Illness Left hip pain/back, constipation, and weakness in her legs. PMHx of COPD, DM, HTN, immune deficiency, renal disorder, arthritis, chronic steroid use.   Clinical Impression   PTA pt was independent and now presents with generalized weakness, acute pain and cognitive deficits limiting her independence with self care tasks.  Pt stated that today she started having tremors when eating causing her to have difficulties keeping her food on her fork. When asked if she was in pain she said no, but immediately afterwards stated that the pain ran deep in her bones. Pt will have 24/7 supervision at home and would benefit from acute OT to maximize her independence prior to D/C. Will continue to follow.    Follow Up Recommendations  Supervision/Assistance - 24 hour;Home health OT    Equipment Recommendations 3-in1, tub seat (Medicaid secondary)     Precautions / Restrictions Precautions Precautions: Fall Restrictions Weight Bearing Restrictions: No      Mobility Bed Mobility             General bed mobility comments: Pt was up in chair up on OT tx.  Transfers Overall transfer level: Needs assistance Equipment used: Rolling walker (2 wheeled) Transfers: Sit to/from Stand Sit to Stand: Min guard         General transfer comment: VC to use RW to decrease pain in L LE as needed. Pt walked off 2 times without the RW and stated that she did not use that at home before and didn't need it.    Balance Overall balance assessment: Needs assistance Sitting-balance support: Feet supported Sitting balance-Leahy Scale: Good       Standing balance-Leahy Scale: Fair                              ADL Overall ADL's : Needs assistance/impaired Eating/Feeding: Minimal assistance;Sitting   Grooming: Min guard;Standing   Upper  Body Bathing: Supervision/ safety;Sitting   Lower Body Bathing: Min guard;Sit to/from stand   Upper Body Dressing : Supervision/safety;Sitting   Lower Body Dressing: Min guard;Sit to/from stand   Toilet Transfer: Min guard;Ambulation;RW;Regular Social worker and Hygiene: Min guard;Sit to/from stand       Functional mobility during ADLs: Min guard;Rolling walker (and no AD) General ADL Comments: Pt stated that she just started having trouble with her hands trembling during eating, and she had trouble keeping the food on her fork. Pt was able to demonstrate LB dressing by taking her sock off and crossing her leg over the other leg. Pt required min guard for functional mobility due to safety.               Pertinent Vitals/Pain Pt said no when asked if she was in pain, but immediately afterwards stated that the pain ran straight to her bones. Pt was positioned in the chair at the end of tx.     Hand Dominance Right   Extremity/Trunk Assessment Upper Extremity Assessment Upper Extremity Assessment: Overall WFL for tasks assessed   Lower Extremity Assessment Lower Extremity Assessment: Defer to PT evaluation       Communication Communication Communication: No difficulties   Cognition Arousal/Alertness: Awake/alert Behavior During Therapy: Impulsive Overall Cognitive Status: Impaired/Different from baseline Area of Impairment: Orientation;Following commands;Safety/judgement Orientation Level: Disoriented to;Time Current Attention Level: Sustained Memory: Decreased short-term memory Following  Commands: Follows multi-step commands inconsistently Safety/Judgement: Decreased awareness of safety   General Comments: Pt was impulsive during tx and would get up and try to walk off without the therapist. When walking, pt required VC to look where she was going and to not bump into things.               Home Living Family/patient expects to be  discharged to:: Private residence Living Arrangements: Spouse/significant other Available Help at Discharge: Family;Available 24 hours/day Type of Home: Apartment Home Access:  (threshold)     Home Layout: One level     Bathroom Shower/Tub: Tub/shower unit;Door Shower/tub characteristics: Sport and exercise psychologistDoor Bathroom Toilet: Standard     Home Equipment: None          Prior Functioning/Environment Level of Independence: Independent             OT Diagnosis: Cognitive deficits;Generalized weakness;Acute pain   OT Problem List: Decreased strength;Decreased safety awareness;Decreased cognition;Impaired balance (sitting and/or standing);Pain   OT Treatment/Interventions: Self-care/ADL training;Cognitive remediation/compensation;Balance training;Patient/family education    OT Goals(Current goals can be found in the care plan section) Acute Rehab OT Goals Patient Stated Goal: Did not state OT Goal Formulation: With patient Time For Goal Achievement: 01/17/14 Potential to Achieve Goals: Good ADL Goals Pt Will Perform Eating: with supervision;with adaptive utensils;sitting Pt Will Perform Grooming: with supervision;standing Pt Will Perform Lower Body Dressing: with supervision;sit to/from stand Pt Will Transfer to Toilet: with supervision;ambulating;regular height toilet Pt Will Perform Toileting - Clothing Manipulation and hygiene: with supervision;sit to/from stand  OT Frequency: Min 2X/week    End of Session Equipment Utilized During Treatment: Engineer, waterolling walker Nurse Communication:  (a chair alarm was put on her)  Activity Tolerance: Patient tolerated treatment well Patient left: in chair;with call bell/phone within reach;with chair alarm set   Time: [id number] OT Time Calculation (min): 25 min Charges:  OT General Charges $OT Visit: 1 Procedure OT Evaluation $Initial OT Evaluation Tier I: 1 Procedure OT Treatments $Self Care/Home Management : 8-22 mins G-Codes: OT G-codes **NOT  FOR INPATIENT CLASS** Functional Assessment Tool Used: clinical observation (clinical observation) Functional Limitation: Self care Self Care Current Status ([id number]): At least 1 percent but less than 20 percent impaired, limited or restricted Self Care Goal Status (J1914(G8988): At least 1 percent but less than 20 percent impaired, limited or restricted  Maurene CapesKeene, Whitney 01/10/2014, 5:27 PM

## 2014-01-10 NOTE — Progress Notes (Signed)
New Admission Note:   Arrival Method: Via [facility] on stretcher  Mental Orientation: Alert and Oriented X4; Flat affect  Telemetry: Not at the moment, still awaiting admission orders  Assessment: Completed Skin: Warm, dry and intact  IV: Clean, dry and intact. Normal saline locked  Pain: None at this time, keeps dozing off  Tubes: N/A Safety Measures: Safety Fall Prevention Plan has been given, discussed and signed Admission: Completed 6 [place] Orientation: Patient has been orientated to the room, unit and staff.  Family: None present at this time   Transfer Orders have been reviewed and implemented. Will continue to monitor the patient. Call light has been placed within reach and bed alarm has been activated.   Admitting MD at bedside right now with patient.   [employer] BSN, RN  Phone number: [phone]

## 2014-01-10 NOTE — Evaluation (Signed)
Physical Therapy Evaluation Patient Details Name: [name] PatellaShirley [name] MRN: [id number] DOB: 04/15/1946 Today's Date: 01/10/2014   History of Present Illness  Left hip pain/back, constipation, and weakness in her legs. PMHx of COPD, DM, HTN, immune deficiency, renal disorder, arthritis, chronic steroid use.  Clinical Impression   Pt admitted with above. Pt currently with functional limitations due to the deficits listed below (see PT Problem List).  Pt will benefit from skilled PT to increase their independence and safety with mobility to allow discharge to the venue listed below.       Follow Up Recommendations Home health PT    Equipment Recommendations  Rolling walker with 5" wheels;3in1 (PT)    Recommendations for Other Services       Precautions / Restrictions Precautions Precautions: Fall Restrictions Weight Bearing Restrictions: No      Mobility  Bed Mobility Overal bed mobility: Modified Independent                Transfers Overall transfer level: Needs assistance Equipment used: Rolling walker (2 wheeled) Transfers: Sit to/from Stand Sit to Stand: Min guard         General transfer comment: Cues to use RW to unweigh painful prox L LE as needed in standing  Ambulation/Gait Ambulation/Gait assistance: Min guard Ambulation Distance (Feet):  (pivot steps bed to chair) Assistive device: Rolling walker (2 wheeled)       General Gait Details: Overall managed taking steps well; no gross antalgic steps noted, but pt reporting pain with WBing and steps  Stairs            Wheelchair Mobility    Modified Rankin (Stroke Patients Only)       Balance Overall balance assessment: No apparent balance deficits (not formally assessed)                                           Pertinent Vitals/Pain Did not specifically rate pain, but reported L flank and hip pain while standing and stepping; no pain while laying down    Home Living  Family/patient expects to be discharged to:: Private residence Living Arrangements: Spouse/significant other Available Help at Discharge: Family;Available 24 hours/day Type of Home: Apartment Home Access:  (threshold)     Home Layout: One level Home Equipment: None      Prior Function Level of Independence: Independent               Hand Dominance   Dominant Hand: Right    Extremity/Trunk Assessment   Upper Extremity Assessment: Defer to OT evaluation           Lower Extremity Assessment: Generalized weakness (limited by pain L hip and L flank pain in standing)         Communication   Communication: No difficulties  Cognition Arousal/Alertness: Lethargic (but arousable) Behavior During Therapy: WFL for tasks assessed/performed (though slow to answer questions) Overall Cognitive Status: Within Functional Limits for tasks assessed (but slow to answer questions)                      General Comments      Exercises        Assessment/Plan    PT Assessment Patient needs continued PT services  PT Diagnosis Difficulty walking;Acute pain   PT Problem List Decreased activity tolerance;Decreased balance;Decreased mobility;Decreased knowledge of use of DME;Pain  PT Treatment  Interventions DME instruction;Gait training;Functional mobility training;Therapeutic activities;Therapeutic exercise;Patient/family education   PT Goals (Current goals can be found in the Care Plan section) Acute Rehab PT Goals Patient Stated Goal: Did not state PT Goal Formulation: With patient Time For Goal Achievement: 01/24/14 Potential to Achieve Goals: Good    Frequency Min 3X/week   Barriers to discharge        Co-evaluation               End of Session   Activity Tolerance: Patient tolerated treatment well;Other (comment);Patient limited by pain (Chose not to walk due to pain) Patient left: in chair;with call bell/phone within reach;Other (comment) (setup for  lunch) Nurse Communication: Mobility status    Functional Assessment Tool Used: Clinical Judgement Functional Limitation: Mobility: Walking and moving around Mobility: Walking and Moving Around Current Status (S3159): At least 1 percent but less than 20 percent impaired, limited or restricted Mobility: Walking and Moving Around Goal Status [phone]): 0 percent impaired, limited or restricted    Time: 1250-1309 PT Time Calculation (min): 19 min   Charges:   PT Evaluation $Initial PT Evaluation Tier I: 1 Procedure PT Treatments $Therapeutic Activity: 8-22 mins   PT G Codes:   Functional Assessment Tool Used: Clinical Judgement Functional Limitation: Mobility: Walking and moving around    Rives, Hildreth Hamff 01/10/2014, 2:08 PM Van Clines, PT  Acute Rehabilitation Services Pager [phone] Office [phone]

## 2014-01-10 NOTE — Evaluation (Signed)
I have read and agree with this note.   Time in/out: 13:29-13:54 Total time: 25 minutes (Ev, 1SC)  Ignacia Palma, OTR/L [phone]

## 2014-01-10 NOTE — Progress Notes (Addendum)
[facility] PROGRESS NOTE  [name] [name] JWJ:[id number] DOB: Jan 05, 1946 DOA: 01/09/2014 PCP: No primary provider on file.  Assessment/Plan: Principal Problem:   Left hip pain Active Problems:   Acute renal injury   Diabetes mellitus without complication   COPD (chronic obstructive pulmonary disease)   Hypertension   Arthritis   Tobacco abuse   Unspecified constipation      Left hip pain/back pain  Patient with history of chronic back pain. Unclear etiology for the cause for her left hip/flank/back pain. CT of abdomen and pelvis noted. Uncertain if constipation is causing her symptoms. Patient has good range of motion in her hip and legs. Start the patient on stool softeners to determine if symptoms will improve. B12 ordered and pending. Vitamin D. levels pending, TSH borderline suppressed, check a free T4, check CK, ESR to rule out polymyalgia rheumatica   Acute renal injury  Patient with history of renal disorder of unclear etiology. Suspect is likely due to dehydration from prerenal etiology or from nausea and vomiting. Gently hydrate the patient on IV fluids. Hold patient's diuretics. CT does not show any signs of obstruction.   Diabetes  Diabetic diet. Sliding scale insulin. Patient does not appear to be on any diabetic medications at home. Hemoglobin A1c ordered  Generalized weakness  Request PT and OT evaluation.   Neuropathy?  Continue gabapentin.   Tobacco abuse  Counseled on cessation.  Constipation  Started the patient on bowel regimen. Will also hydrate the patient on IV fluids.  Arthritis  Continue home medications including pain medications.  Hypertension  Continue home carvedilol. Holding home diuretics for now.  COPD with possible chronic respiratory failure  Uncertain if patient is on home oxygen. Check pulse oximetry on room air with ambulation  Chronic steroid use in a patient with history of immunodeficiency disorder of unclear etiology   Unclear etiology why patient is on steroids. Patient is a poor historian. Request records from her primary care physician. Continue steroids for now.   CODE STATUS  Full code.  Disposition  Monitor under observation    Brief narrative: 68 y.o. African American female with history of diabetes appears to be not on any diabetic medications, COPD uncertain if patient is on home oxygen, arthritis, immune deficiency disorder of unclear etiology, hypertension, history of renal disorder of unclear etiology, and chronic steroid use of unclear etiology who presents with the above complaints. Patient is an extremely poor historian, she indicates that she has had weakness in her legs about a week ago. She also has been having left hip pain/back in the same period of time. She has been constipated again in the same period of time. She had nausea and vomiting once yesterday as a result she presented to the emergency department for further evaluation. Patient's labs were checked and her creatinine was 1.3, hospitalist service was asked to admit the patient for acute kidney injury. Patient denies any recent fevers, chills, chest pain, shortness of breath, diarrhea, headaches or vision changes   Consultants:  *None  Procedures: None  Antibiotics: None HPI/Subjective: Complaining of left hip pain, no falls no difficulty ambulating  Objective: Filed Vitals:   01/10/14 0154 01/10/14 0155 01/10/14 0530 01/10/14 0927  BP: 129/85  115/71 118/70  Pulse: 80  78 78  Temp: 98.4 F (36.9 C)  99 F (37.2 C) 97.6 F (36.4 C)  TempSrc: Oral  Oral Oral  Resp: 16  16 16   Height: 5' 3"  (1.6 m)     Weight: 45.5  kg (100 lb 5 oz)     SpO2: 88% 91% 88% 95%   No intake or output data in the 24 hours ending 01/10/14 1134  Exam:  General: Awake, Oriented x3, No acute distress.  HEENT: EOMI, Moist mucous membranes  Neck: Supple  CV: S1 and S2  Lungs: Clear to ascultation bilaterally  Abdomen: Soft,  Nontender, Nondistended, +bowel sounds.  Ext: Good pulses. Trace edema. No clubbing or cyanosis noted. Good right and left hip range of motion without pain.  Neuro: Cranial Nerves II-XII grossly intact. Has 5/5 motor strength in upper and lower extremities.    Data Reviewed: Basic Metabolic Panel:  Recent Labs Lab 01/09/14 2000 01/10/14 0405  NA 138 139  K 5.0 4.1  CL 98 100  CO2 20 23  GLUCOSE 144* 120*  BUN 30* 28*  CREATININE 1.30* 1.23*  CALCIUM 10.3 9.3    Liver Function Tests:  Recent Labs Lab 01/09/14 2000  AST 81*  ALT 20  ALKPHOS 107  BILITOT 0.4  PROT 8.5*  ALBUMIN 3.9    Recent Labs Lab 01/09/14 2000  LIPASE 176*   No results found for this basename: AMMONIA,  in the last 168 hours  CBC:  Recent Labs Lab 01/09/14 2000 01/10/14 0405  WBC 10.0 11.7*  NEUTROABS 8.4*  --   HGB 16.6* 14.6  HCT 52.1* 47.4*  MCV 82.0 85.6  PLT 249 203    Cardiac Enzymes: No results found for this basename: CKTOTAL, CKMB, CKMBINDEX, TROPONINI,  in the last 168 hours BNP (last 3 results) No results found for this basename: PROBNP,  in the last 8760 hours   CBG:  Recent Labs Lab 01/10/14 0748  GLUCAP 130*    No results found for this or any previous visit (from the past 240 hour(s)).   Studies: Ct Abdomen Pelvis Wo Contrast  01/10/2014   CLINICAL DATA:  Abdominal pain  EXAM: CT ABDOMEN AND PELVIS WITHOUT CONTRAST  TECHNIQUE: Multidetector CT imaging of the abdomen and pelvis was performed following the standard protocol without IV contrast.  COMPARISON:  04/11/2012  FINDINGS: BODY WALL: Unremarkable.  LOWER CHEST: Moderate cardiomegaly. Diffuse coronary artery atherosclerosis. Subpleural lucencies in the bilateral lungs favor paraseptal emphysema over fibrosis.  ABDOMEN/PELVIS:  Liver: No focal abnormality.  Biliary: Homogeneously high density the gallbladder, likely sludge. There is no discrete calcified stone or gallbladder distention.  Pancreas:  Unremarkable.  Spleen: Unremarkable.  Adrenals: Bilateral thickening of the adrenal glands without measurable nodule, stable from 2013.  Kidneys and ureters: Extensive arterial calcification in the bilateral renal hila. There is symmetric perinephric edema. No hydronephrosis.  Bladder: Unremarkable.  Reproductive: Multiple uterine fibroids with dystrophic calcification. The largest is exophytic from the fundus measuring 3.3 cm.  Bowel: No obstruction. High-density material within the non dilated appendix.  Retroperitoneum: No mass or adenopathy.  Peritoneum: No ascites or pneumoperitoneum.  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 No indication of aneurysm rupture. The aortic terminus is also focally ectatic, measuring 2.4 cm in diameter.  OSSEOUS: No acute abnormalities. Mixed sclerosis and lucency in the left iliac crest is stable from 2013, consistent with benign process. There is multilevel lumbar degenerative disc disease. No fracture or destructive process to explain back pain.  IMPRESSION: 1. No acute intra-abdominal findings. 2. 2.5 cm right common iliac artery aneurysm, size stable from 2013. 3. Additional incidental findings are noted above.   Electronically Signed   By: Gilford Silvius.D.  On: 01/10/2014 00:00   Dg Pelvis 1-2 Views  01/10/2014   CLINICAL DATA:  Left hip pain.  EXAM: PELVIS - 1-2 VIEW  COMPARISON:  Sagittal and coronal re-formatted images from CT abdomen pelvis 01/09/2014.  FINDINGS: Hip joint space is maintained bilaterally. No subchondral sclerosis or cyst formation. No osteophytosis. Obturator rings are intact. Degenerative changes are seen at the lumbosacral junction. Atherosclerotic calcification of the arterial vasculature.  IMPRESSION: Degenerative changes at the lumbosacral junction. Otherwise, no findings to explain the patient's hip pain.   Electronically Signed   By: Lorin Picket M.D.   On: 01/10/2014 10:07     Scheduled Meds: . carvedilol  12.5 mg Oral BID WC  . enoxaparin (LOVENOX) injection  30 mg Subcutaneous Q24H  . gabapentin  300 mg Oral TID  . insulin aspart  0-5 Units Subcutaneous QHS  . insulin aspart  0-9 Units Subcutaneous TID WC  . mirtazapine  30 mg Oral QHS  . polyethylene glycol  17 g Oral BID  . predniSONE  50 mg Oral Daily  . senna-docusate  1 tablet Oral BID   Continuous Infusions: . sodium chloride 75 mL/hr at 01/10/14 7915    Principal Problem:   Left hip pain Active Problems:   Acute renal injury   Diabetes mellitus without complication   COPD (chronic obstructive pulmonary disease)   Hypertension   Arthritis   Tobacco abuse   Unspecified constipation    Time spent: 40 minutes   [facility] Pager [phone]. If 8PM-8AM, please contact night-coverage at [email or web address], password [email or web address] 01/10/2014, 11:34 AM  LOS: 1 day

## 2014-01-11 DIAGNOSIS — E43 Unspecified severe protein-calorie malnutrition: Secondary | ICD-10-CM | POA: Insufficient documentation

## 2014-01-11 DIAGNOSIS — N179 Acute kidney failure, unspecified: Secondary | ICD-10-CM | POA: Diagnosis not present

## 2014-01-11 LAB — COMPREHENSIVE METABOLIC PANEL
ALT: 17 U/L (ref 0–35)
AST: 33 U/L (ref 0–37)
Albumin: 2.9 g/dL — ABNORMAL LOW (ref 3.5–5.2)
Alkaline Phosphatase: 94 U/L (ref 39–117)
BILIRUBIN TOTAL: 0.3 mg/dL (ref 0.3–1.2)
BUN: 27 mg/dL — AB (ref 6–23)
CALCIUM: 9.5 mg/dL (ref 8.4–10.5)
CHLORIDE: 98 meq/L (ref 96–112)
CO2: 23 meq/L (ref 19–32)
CREATININE: 1.25 mg/dL — AB (ref 0.50–1.10)
GFR calc Af Amer: 50 mL/min — ABNORMAL LOW (ref >90)
GFR, EST NON AFRICAN AMERICAN: 43 mL/min — AB (ref >90)
Glucose, Bld: 97 mg/dL (ref 70–99)
Potassium: 4.6 mEq/L (ref 3.7–5.3)
Sodium: 136 mEq/L — ABNORMAL LOW (ref 137–147)
Total Protein: 6.8 g/dL (ref 6.0–8.3)

## 2014-01-11 LAB — GLUCOSE, CAPILLARY
GLUCOSE-CAPILLARY: 125 mg/dL — AB (ref 70–99)
Glucose-Capillary: 113 mg/dL — ABNORMAL HIGH (ref 70–99)

## 2014-01-11 LAB — RAPID URINE DRUG SCREEN, HOSP PERFORMED
Amphetamines: NOT DETECTED
Barbiturates: NOT DETECTED
Benzodiazepines: NOT DETECTED
COCAINE: NOT DETECTED
Opiates: NOT DETECTED
Tetrahydrocannabinol: POSITIVE — AB

## 2014-01-11 LAB — URINE CULTURE
CULTURE: NO GROWTH
Colony Count: NO GROWTH

## 2014-01-11 LAB — VITAMIN D 25 HYDROXY (VIT D DEFICIENCY, FRACTURES): Vit D, 25-Hydroxy: 22 ng/mL — ABNORMAL LOW (ref 30–89)

## 2014-01-11 LAB — VITAMIN B12: VITAMIN B 12: 783 pg/mL (ref 211–911)

## 2014-01-11 MED ORDER — ERGOCALCIFEROL 1.25 MG (50000 UT) PO CAPS: 50000.0000 [IU] | ORAL_CAPSULE | ORAL | Status: DC

## 2014-01-11 MED ORDER — SENNOSIDES-DOCUSATE SODIUM 8.6-50 MG PO TABS: 1.0000 | ORAL_TABLET | Freq: Two times a day (BID) | ORAL | Status: DC

## 2014-01-11 MED ORDER — ACETAMINOPHEN 325 MG PO TABS: 650.0000 mg | ORAL_TABLET | Freq: Four times a day (QID) | ORAL | Status: DC | PRN

## 2014-01-11 MED ORDER — FUROSEMIDE 40 MG PO TABS: 20.0000 mg | ORAL_TABLET | Freq: Every day | ORAL | Status: DC

## 2014-01-11 NOTE — Progress Notes (Signed)
Physical Therapy Treatment and Discharge Patient Details Name: [name] [name] MRN: [id number] DOB: 06/26/1946 Today's Date: 01/11/2014    History of Present Illness Left hip pain/back, constipation, and weakness in her legs. PMHx of COPD, DM, HTN, immune deficiency, renal disorder, arthritis, chronic steroid use.    PT Comments    Overall moving well, with no loss of balance with progressive ambulation; All PT goals met; will dc acute PT;  Recommend HHRN follow-up for chronic disease management   Follow Up Recommendations  No PT follow up;Supervision - Intermittent     Equipment Recommendations  None recommended by PT    Recommendations for Other Services       Precautions / Restrictions Precautions Precautions: None Restrictions Weight Bearing Restrictions: No    Mobility  Bed Mobility Overal bed mobility: Modified Independent                Transfers Overall transfer level: Modified independent Equipment used: None Transfers: Sit to/from Stand Sit to Stand: Modified independent (Device/Increase time)         General transfer comment: smooth transition; no difficulty noted  Ambulation/Gait Ambulation/Gait assistance: Independent Ambulation Distance (Feet): 300 Feet Assistive device: None Gait Pattern/deviations: Step-through pattern;WFL(Within Functional Limits) Gait velocity: normal   General Gait Details: Gait WNL   Stairs Stairs:  (pt declined stair practice)          Wheelchair Mobility    Modified Rankin (Stroke Patients Only)       Balance Overall balance assessment: Needs assistance Sitting-balance support: Feet supported Sitting balance-Leahy Scale: Good       Standing balance-Leahy Scale: Good                      Cognition Arousal/Alertness: Awake/alert Behavior During Therapy: WFL for tasks assessed/performed;Impulsive Overall Cognitive Status: Impaired/Different from baseline Area of Impairment: Memory      Memory: Decreased short-term memory Following Commands: Follows one step commands consistently Safety/Judgement: Decreased awareness of safety     General Comments: Reportedshe didn't remember the pain in her LLE    Exercises      General Comments        Pertinent Vitals/Pain no apparent distress     Home Living                      Prior Function            PT Goals (current goals can now be found in the care plan section) Acute Rehab PT Goals Patient Stated Goal: to go home Progress towards PT goals: Goals met/education completed, patient discharged from PT    Frequency  Other (Comment) (Goals met, dc PT)    PT Plan Discharge plan needs to be updated    Co-evaluation             End of Session   Activity Tolerance: Patient tolerated treatment well Patient left: in chair;with family/visitor present     Time: 1660-6004 PT Time Calculation (min): 10 min  Charges:  $Gait Training: 8-22 mins                    G Codes:  Functional Assessment Tool Used: Clinical Judgement Functional Limitation: Mobility: Walking and moving around Mobility: Walking and Moving Around Goal Status [phone]): 0 percent impaired, limited or restricted Mobility: Walking and Moving Around Discharge Status [phone]): 0 percent impaired, limited or restricted   Roney Marion [facility] 01/11/2014, 12:47 PM  Roney Marion, PT  Acute Rehabilitation Services Pager [phone] Office [phone]

## 2014-01-11 NOTE — Discharge Instructions (Signed)
Resume Lasix after patient instructed to do so by PCP. The patient needs to have a BMP at the PCP office.

## 2014-01-11 NOTE — Care Management Note (Addendum)
CARE MANAGEMENT NOTE 01/11/2014  Patient:  [name],[name]   Account Number:  [id number]  Date Initiated:  01/11/2014  Documentation initiated by:  ROYAL,CHERYL  Subjective/Objective Assessment:   Order for [facility] and DME.     Action/Plan:   Met with pt and discussed HH needs. Pt somewhat confused re exactly what she has and what she is using. Has home oxygen, states that she has a walker and [place] aide who comes daily to clean and cook. Uses oxygen as needed per pt.   Anticipated DC Date:  01/11/2014   Anticipated DC Plan:  [place]         Choice offered to / List presented to:  C-1 Patient        [place] arranged  HH-1 RN  [place].   Status of service:  Completed, signed off Medicare Important Message given?  NA - LOS <3 / Initial given by admissions (If response is "NO", the following Medicare IM given date fields will be blank) Date Medicare IM given:   Medicare IM given by:   Date Additional Medicare IM given:   Additional Medicare IM given by:    Discharge Disposition:    Per UR Regulation:    If discussed at Long Length of Stay Meetings, dates discussed:    Comments:  01/11/2014 Met with pt and discussed at length [facility] needs, pt continues to validate that she has a [place] aide, unsure of agency , however her name is [name] [name] and she comes "everyday and stays a long time".  Pt has oxygen at home, however unclear as to how often she uses it and she states that she is using tanks at home, unclear about a concentrator. Appears that this pt has used [facility] in the past and wishes to use them again, call placed to [facility]. Will attempt to reach this pt daughter to clarify this pt needs.  CRoyal RN MPH, case manager, [phone] addem: This CM spoke with pt son, who came to visit pt and he stated that the pt does have home oxygen and the family knows how to use the equipment. He validated that the pt has a HH aide and a PCP, [facility]. Per son the pt has a rolling walker.  He will transport pt to home. [facility] will provide HHRN and HHPT, per their records they attempted to provide services for this pt in 2013 and she was not at home for visits. They will attempt to provide services once again. CRoyal RN MPH, case manager, [phone]

## 2014-01-11 NOTE — Progress Notes (Signed)
Occupational Therapy Treatment and Discharge Patient Details Name: [name] [name] MRN: [id number] DOB: 08/04/1945 Today's Date: 01/11/2014    History of present illness Left hip pain/back, constipation, and weakness in her legs. PMHx of COPD, DM, HTN, immune deficiency, renal disorder, arthritis, chronic steroid use.   OT comments  Focus of today's session was on grooming. Pt stated that the tremor in her hands during eating had decreased and she was now able to feed herself without difficulty. Pt will have 24/7 supervision at home and would benefit from home health OT to maximize independence and increase her safety awareness. All education has been met and therefore no further acute OT is needed.  Follow Up Recommendations  Supervision/Assistance - 24 hour;Home health OT    Equipment Recommendations  3 in 1 bedside comode;Tub/shower seat (medicaid secondary)   Recommendations for Other Services      Precautions / Restrictions Precautions Precautions: Fall Restrictions Weight Bearing Restrictions: No       Mobility Bed Mobility Overal bed mobility: Modified Independent                Transfers Overall transfer level: Needs assistance Equipment used: None Transfers: Sit to/from Stand Sit to Stand: Min guard         General transfer comment: VC to walk slower and to be more aware of her surroundings.    Balance Overall balance assessment: Needs assistance Sitting-balance support: Feet supported Sitting balance-Leahy Scale: Good       Standing balance-Leahy Scale: Fair                     ADL Overall ADL's : Needs assistance/impaired     Grooming: Standing;Wash/dry face;Oral care;Wash/dry hands;Brushing hair;Min guard                               Functional mobility during ADLs: Min guard General ADL Comments: Pt stated that the tremors she was having yesterday with eating have decreased and she is now able to feed herself without  difficulty. Pt was able to stand at the sink for 3 grooming tasks with min guard for safety.                Cognition   Behavior During Therapy: WFL for tasks assessed/performed Overall Cognitive Status: Impaired/Different from baseline Area of Impairment: Memory;Safety/judgement     Memory: Decreased short-term memory    Safety/Judgement: Decreased awareness of safety     General Comments: Pt was stated that the reason she came to the hospital was because she could not breath and denied having any pain or weakness in her LEs.                 Pertinent Vitals/ Pain       Pt denies any pain during tx.         Frequency Min 2X/week     Progress Toward Goals  OT Goals(current goals can now be found in the care plan section)     Acute Rehab OT Goals Patient Stated Goal: to go home OT Goal Formulation: With patient Time For Goal Achievement: 01/18/14 Potential to Achieve Goals: Good  Plan Discharge plan remains appropriate          Activity Tolerance Patient tolerated treatment well   Patient Left in chair;with call bell/phone within reach;with chair alarm set           Time:  -  Charges:    Lyda Perone 01/11/2014, 11:37 AM

## 2014-01-11 NOTE — Discharge Summary (Addendum)
Physician Discharge Summary  [name] [name] MRN: [id number] DOB/AGE: Jun 10, 1946 68 y.o.  PCP: No primary Denney Shein on file.   Admit date: 01/09/2014 Discharge date: 01/11/2014  Discharge Diagnoses:   Vitamin D deficiency   Left hip pain Active Problems:   Acute renal injury   Diabetes mellitus without complication   COPD (chronic obstructive pulmonary disease)   Hypertension   Arthritis   Tobacco abuse   Unspecified constipation   Protein-calorie malnutrition, severe  Followup recommendations Patient advised to reduce dose of Lasix because of borderline low blood pressure, need to repeat BMP upon PCP followup     Medication List    STOP taking these medications       GOODY HEADACHE PO      TAKE these medications       acetaminophen 325 MG tablet  Commonly known as:  TYLENOL  Take 2 tablets (650 mg total) by mouth every 6 (six) hours as needed for mild pain (or Fever >/= 101).     albuterol 108 (90 BASE) MCG/ACT inhaler  Commonly known as:  PROVENTIL HFA;VENTOLIN HFA  Inhale 2 puffs into the lungs 4 (four) times daily as needed for wheezing or shortness of breath.     bisacodyl 5 MG EC tablet  Commonly known as:  DULCOLAX  Take 5 mg by mouth daily as needed (constipation).     budesonide-formoterol 80-4.5 MCG/ACT inhaler  Commonly known as:  SYMBICORT  Inhale 2 puffs into the lungs 2 (two) times daily.     carvedilol 12.5 MG tablet  Commonly known as:  COREG  Take 12.5 mg by mouth 2 (two) times daily with a meal.     ergocalciferol 50000 UNITS capsule  Commonly known as:  VITAMIN D2  Take 1 capsule (50,000 Units total) by mouth once a week.     furosemide 40 MG tablet  Commonly known as:  LASIX  Take 0.5 tablets (20 mg total) by mouth daily.     gabapentin 300 MG capsule  Commonly known as:  NEURONTIN  Take 300 mg by mouth 3 (three) times daily.     lisinopril 5 MG tablet  Commonly known as:  PRINIVIL,ZESTRIL  Take 5 mg by mouth daily.      mirtazapine 30 MG tablet  Commonly known as:  REMERON  Take 30 mg by mouth at bedtime.     potassium chloride 10 MEQ tablet  Commonly known as:  K-DUR,KLOR-CON  Take 10 mEq by mouth 2 (two) times daily.     senna-docusate 8.6-50 MG per tablet  Commonly known as:  Senokot-S  Take 1 tablet by mouth 2 (two) times daily.        Discharge Condition: Stable Disposition: 01-Home or Self Care   Consults: None  Significant Diagnostic Studies: Ct Abdomen Pelvis Wo Contrast  01/10/2014   CLINICAL DATA:  Abdominal pain  EXAM: CT ABDOMEN AND PELVIS WITHOUT CONTRAST  TECHNIQUE: Multidetector CT imaging of the abdomen and pelvis was performed following the standard protocol without IV contrast.  COMPARISON:  04/11/2012  FINDINGS: BODY WALL: Unremarkable.  LOWER CHEST: Moderate cardiomegaly. Diffuse coronary artery atherosclerosis. Subpleural lucencies in the bilateral lungs favor paraseptal emphysema over fibrosis.  ABDOMEN/PELVIS:  Liver: No focal abnormality.  Biliary: Homogeneously high density the gallbladder, likely sludge. There is no discrete calcified stone or gallbladder distention.  Pancreas: Unremarkable.  Spleen: Unremarkable.  Adrenals: Bilateral thickening of the adrenal glands without measurable nodule, stable from 2013.  Kidneys and ureters: Extensive arterial calcification  in the bilateral renal hila. There is symmetric perinephric edema. No hydronephrosis.  Bladder: Unremarkable.  Reproductive: Multiple uterine fibroids with dystrophic calcification. The largest is exophytic from the fundus measuring 3.3 cm.  Bowel: No obstruction. High-density material within the non dilated appendix.  Retroperitoneum: No mass or adenopathy.  Peritoneum: No ascites or pneumoperitoneum.  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 No indication of aneurysm rupture. The aortic terminus is also focally ectatic, measuring 2.4 cm in diameter.  OSSEOUS: No acute  abnormalities. Mixed sclerosis and lucency in the left iliac crest is stable from 2013, consistent with benign process. There is multilevel lumbar degenerative disc disease. No fracture or destructive process to explain back pain.  IMPRESSION: 1. No acute intra-abdominal findings. 2. 2.5 cm right common iliac artery aneurysm, size stable from 2013. 3. Additional incidental findings are noted above.   Electronically Signed   By: Jorje Guild M.D.   On: 01/10/2014 00:00   Dg Pelvis 1-2 Views  01/10/2014   CLINICAL DATA:  Left hip pain.  EXAM: PELVIS - 1-2 VIEW  COMPARISON:  Sagittal and coronal re-formatted images from CT abdomen pelvis 01/09/2014.  FINDINGS: Hip joint space is maintained bilaterally. No subchondral sclerosis or cyst formation. No osteophytosis. Obturator rings are intact. Degenerative changes are seen at the lumbosacral junction. Atherosclerotic calcification of the arterial vasculature.  IMPRESSION: Degenerative changes at the lumbosacral junction. Otherwise, no findings to explain the patient's hip pain.   Electronically Signed   By: Lorin Picket M.D.   On: 01/10/2014 10:07   *    Microbiology: Recent Results (from the past 240 hour(s))  URINE CULTURE     Status: None   Collection Time    01/09/14  9:28 PM      Result Value Ref Range Status   Specimen Description URINE, CLEAN CATCH   Final   Special Requests NONE   Final   Culture  Setup Time     Final   Value: 01/10/2014 02:17     Performed at [facility]     Final   Value: NO GROWTH     Performed at [facility]   Culture     Final   Value: NO GROWTH     Performed at [facility]   Report Status 01/11/2014 FINAL   Final     Labs: Results for orders placed during the hospital encounter of 01/09/14 (from the past 48 hour(s))  CBC WITH DIFFERENTIAL     Status: Abnormal   Collection Time    01/09/14  8:00 PM      Result Value Ref Range   WBC 10.0  4.0 - 10.5 K/uL   RBC  6.35 (*) 3.87 - 5.11 MIL/uL   Hemoglobin 16.6 (*) 12.0 - 15.0 g/dL   HCT 52.1 (*) 36.0 - 46.0 %   MCV 82.0  78.0 - 100.0 fL   MCH 26.1  26.0 - 34.0 pg   MCHC 31.9  30.0 - 36.0 g/dL   RDW 21.4 (*) 11.5 - 15.5 %   Platelets 249  150 - 400 K/uL   Neutrophils Relative % 84 (*) 43 - 77 %   Neutro Abs 8.4 (*) 1.7 - 7.7 K/uL   Lymphocytes Relative 8 (*) 12 - 46 %   Lymphs Abs 0.8  0.7 - 4.0 K/uL   Monocytes Relative 8  3 - 12 %   Monocytes Absolute 0.8  0.1 - 1.0 K/uL   Eosinophils Relative 0  0 - 5 %   Eosinophils Absolute 0.0  0.0 - 0.7 K/uL   Basophils Relative 0  0 - 1 %   Basophils Absolute 0.0  0.0 - 0.1 K/uL   WBC Morphology VACUOLATED NEUTROPHILS    COMPREHENSIVE METABOLIC PANEL     Status: Abnormal   Collection Time    01/09/14  8:00 PM      Result Value Ref Range   Sodium 138  137 - 147 mEq/L   Potassium 5.0  3.7 - 5.3 mEq/L   Comment: SLIGHT HEMOLYSIS     HEMOLYSIS AT THIS LEVEL MAY AFFECT RESULT   Chloride 98  96 - 112 mEq/L   CO2 20  19 - 32 mEq/L   Glucose, Bld 144 (*) 70 - 99 mg/dL   BUN 30 (*) 6 - 23 mg/dL   Creatinine, Ser 1.30 (*) 0.50 - 1.10 mg/dL   Calcium 10.3  8.4 - 10.5 mg/dL   Total Protein 8.5 (*) 6.0 - 8.3 g/dL   Albumin 3.9  3.5 - 5.2 g/dL   AST 81 (*) 0 - 37 U/L   Comment: SLIGHT HEMOLYSIS     HEMOLYSIS AT THIS LEVEL MAY AFFECT RESULT   ALT 20  0 - 35 U/L   Comment: SLIGHT HEMOLYSIS     HEMOLYSIS AT THIS LEVEL MAY AFFECT RESULT   Alkaline Phosphatase 107  39 - 117 U/L   Total Bilirubin 0.4  0.3 - 1.2 mg/dL   GFR calc non Af Amer 41 (*) >90 mL/min   GFR calc Af Amer 47 (*) >90 mL/min   Comment: (NOTE)     The eGFR has been calculated using the CKD EPI equation.     This calculation has not been validated in all clinical situations.     eGFR's persistently <90 mL/min signify possible Chronic Kidney     Disease.  LIPASE, BLOOD     Status: Abnormal   Collection Time    01/09/14  8:00 PM      Result Value Ref Range   Lipase 176 (*) 11 - 59 U/L   ETHANOL     Status: None   Collection Time    01/09/14  8:00 PM      Result Value Ref Range   Alcohol, Ethyl (B) <11  0 - 11 mg/dL   Comment:            LOWEST DETECTABLE LIMIT FOR     SERUM ALCOHOL IS 11 mg/dL     FOR MEDICAL PURPOSES ONLY  URINALYSIS, ROUTINE W REFLEX MICROSCOPIC     Status: Abnormal   Collection Time    01/09/14  9:28 PM      Result Value Ref Range   Color, Urine YELLOW  YELLOW   APPearance CLOUDY (*) CLEAR   Specific Gravity, Urine 1.018  1.005 - 1.030   pH 5.0  5.0 - 8.0   Glucose, UA NEGATIVE  NEGATIVE mg/dL   Hgb urine dipstick NEGATIVE  NEGATIVE   Bilirubin Urine SMALL (*) NEGATIVE   Ketones, ur NEGATIVE  NEGATIVE mg/dL   Protein, ur 30 (*) NEGATIVE mg/dL   Urobilinogen, UA 0.2  0.0 - 1.0 mg/dL   Nitrite NEGATIVE  NEGATIVE   Leukocytes, UA SMALL (*) NEGATIVE  URINE CULTURE     Status: None   Collection Time    01/09/14  9:28 PM      Result Value Ref Range  Specimen Description URINE, CLEAN CATCH     Special Requests NONE     Culture  Setup Time       Value: 01/10/2014 02:17     Performed at [facility]       Value: NO GROWTH     Performed at [facility]   Culture       Value: NO GROWTH     Performed at [facility]   Report Status 01/11/2014 FINAL    URINE MICROSCOPIC-ADD ON     Status: Abnormal   Collection Time    01/09/14  9:28 PM      Result Value Ref Range   Squamous Epithelial / LPF FEW (*) RARE   WBC, UA 3-6  <3 WBC/hpf   RBC / HPF 0-2  <3 RBC/hpf   Bacteria, UA FEW (*) RARE  BASIC METABOLIC PANEL     Status: Abnormal   Collection Time    01/10/14  4:05 AM      Result Value Ref Range   Sodium 139  137 - 147 mEq/L   Potassium 4.1  3.7 - 5.3 mEq/L   Chloride 100  96 - 112 mEq/L   CO2 23  19 - 32 mEq/L   Glucose, Bld 120 (*) 70 - 99 mg/dL   BUN 28 (*) 6 - 23 mg/dL   Creatinine, Ser 1.23 (*) 0.50 - 1.10 mg/dL   Calcium 9.3  8.4 - 10.5 mg/dL   GFR calc non Af Amer 44 (*) >90 mL/min    GFR calc Af Amer 51 (*) >90 mL/min   Comment: (NOTE)     The eGFR has been calculated using the CKD EPI equation.     This calculation has not been validated in all clinical situations.     eGFR's persistently <90 mL/min signify possible Chronic Kidney     Disease.  CBC     Status: Abnormal   Collection Time    01/10/14  4:05 AM      Result Value Ref Range   WBC 11.7 (*) 4.0 - 10.5 K/uL   RBC 5.54 (*) 3.87 - 5.11 MIL/uL   Hemoglobin 14.6  12.0 - 15.0 g/dL   HCT 47.4 (*) 36.0 - 46.0 %   MCV 85.6  78.0 - 100.0 fL   MCH 26.4  26.0 - 34.0 pg   MCHC 30.8  30.0 - 36.0 g/dL   RDW 19.0 (*) 11.5 - 15.5 %   Platelets 203  150 - 400 K/uL  TSH     Status: Abnormal   Collection Time    01/10/14  4:05 AM      Result Value Ref Range   TSH 0.324 (*) 0.350 - 4.500 uIU/mL  HEMOGLOBIN A1C     Status: Abnormal   Collection Time    01/10/14  4:05 AM      Result Value Ref Range   Hemoglobin A1C 6.0 (*) <5.7 %   Comment: (NOTE)                                                                               According to the ADA Clinical Practice Recommendations for  2011, when     HbA1c is used as a screening test:      >=6.5%   Diagnostic of Diabetes Mellitus               (if abnormal result is confirmed)     5.7-6.4%   Increased risk of developing Diabetes Mellitus     References:Diagnosis and Classification of Diabetes Mellitus,Diabetes     IOXB,3532,99(MEQAS 1):S62-S69 and Standards of Medical Care in             Diabetes - 2011,Diabetes TMHD,6222,97 (Suppl 1):S11-S61.   Mean Plasma Glucose 126 (*) <117 mg/dL   Comment: Performed at [facility], [facility]     Status: Abnormal   Collection Time    01/10/14  7:48 AM      Result Value Ref Range   Glucose-Capillary 130 (*) 70 - 99 mg/dL   Comment 1 Notify RN     Comment 2 Documented in Chart    VITAMIN B12     Status: None   Collection Time    01/10/14  9:47 AM      Result Value Ref Range   Vitamin B-12 783  211 - 911  pg/mL   Comment: Performed at [facility] D 25 HYDROXY     Status: Abnormal   Collection Time    01/10/14 10:25 AM      Result Value Ref Range   Vit D, 25-Hydroxy 22 (*) 30 - 89 ng/mL   Comment: (NOTE)     This assay accurately quantifies Vitamin D, which is the sum of the     25-Hydroxy forms of Vitamin D2 and D3.  Studies have shown that the     optimum concentration of 25-Hydroxy Vitamin D is 30 ng/mL or higher.      Concentrations of Vitamin D between 20 and 29 ng/mL are considered to     be insufficient and concentrations less than 20 ng/mL are considered     to be deficient for Vitamin D.     Performed at [facility]  T4, FREE     Status: None   Collection Time    01/10/14 12:00 PM      Result Value Ref Range   Free T4 1.26  0.80 - 1.80 ng/dL   Comment: Performed at [facility]     Status: None   Collection Time    01/10/14 12:00 PM      Result Value Ref Range   Sed Rate 10  0 - 22 mm/hr  GLUCOSE, CAPILLARY     Status: Abnormal   Collection Time    01/10/14 12:06 PM      Result Value Ref Range   Glucose-Capillary 163 (*) 70 - 99 mg/dL   Comment 1 Notify RN     Comment 2 Documented in Chart    HEMOGLOBIN A1C     Status: Abnormal   Collection Time    01/10/14 12:36 PM      Result Value Ref Range   Hemoglobin A1C 6.0 (*) <5.7 %   Comment: (NOTE)  According to the ADA Clinical Practice Recommendations for 2011, when     HbA1c is used as a screening test:      >=6.5%   Diagnostic of Diabetes Mellitus               (if abnormal result is confirmed)     5.7-6.4%   Increased risk of developing Diabetes Mellitus     References:Diagnosis and Classification of Diabetes Mellitus,Diabetes     KVQQ,5956,38(VFIEP 1):S62-S69 and Standards of Medical Care in             Diabetes - 2011,Diabetes PIRJ,1884,16 (Suppl 1):S11-S61.   Mean Plasma Glucose  126 (*) <117 mg/dL   Comment: Performed at [place], [facility]     Status: Abnormal   Collection Time    01/10/14  5:28 PM      Result Value Ref Range   Glucose-Capillary 218 (*) 70 - 99 mg/dL   Comment 1 Notify RN     Comment 2 Documented in Chart    GLUCOSE, CAPILLARY     Status: Abnormal   Collection Time    01/10/14  9:19 PM      Result Value Ref Range   Glucose-Capillary 166 (*) 70 - 99 mg/dL  COMPREHENSIVE METABOLIC PANEL     Status: Abnormal   Collection Time    01/11/14  5:38 AM      Result Value Ref Range   Sodium 136 (*) 137 - 147 mEq/L   Potassium 4.6  3.7 - 5.3 mEq/L   Chloride 98  96 - 112 mEq/L   CO2 23  19 - 32 mEq/L   Glucose, Bld 97  70 - 99 mg/dL   BUN 27 (*) 6 - 23 mg/dL   Creatinine, Ser 1.25 (*) 0.50 - 1.10 mg/dL   Calcium 9.5  8.4 - 10.5 mg/dL   Total Protein 6.8  6.0 - 8.3 g/dL   Albumin 2.9 (*) 3.5 - 5.2 g/dL   AST 33  0 - 37 U/L   ALT 17  0 - 35 U/L   Alkaline Phosphatase 94  39 - 117 U/L   Total Bilirubin 0.3  0.3 - 1.2 mg/dL   GFR calc non Af Amer 43 (*) >90 mL/min   GFR calc Af Amer 50 (*) >90 mL/min   Comment: (NOTE)     The eGFR has been calculated using the CKD EPI equation.     This calculation has not been validated in all clinical situations.     eGFR's persistently <90 mL/min signify possible Chronic Kidney     Disease.  GLUCOSE, CAPILLARY     Status: Abnormal   Collection Time    01/11/14  7:45 AM      Result Value Ref Range   Glucose-Capillary 113 (*) 70 - 99 mg/dL  URINE RAPID DRUG SCREEN (HOSP PERFORMED)     Status: Abnormal   Collection Time    01/11/14  7:50 AM      Result Value Ref Range   Opiates NONE DETECTED  NONE DETECTED   Cocaine NONE DETECTED  NONE DETECTED   Benzodiazepines NONE DETECTED  NONE DETECTED   Amphetamines NONE DETECTED  NONE DETECTED   Tetrahydrocannabinol POSITIVE (*) NONE DETECTED   Barbiturates NONE DETECTED  NONE DETECTED   Comment:            DRUG SCREEN FOR MEDICAL PURPOSES      ONLY.  IF CONFIRMATION IS NEEDED     FOR  ANY PURPOSE, NOTIFY LAB     WITHIN 5 DAYS.                LOWEST DETECTABLE LIMITS     FOR URINE DRUG SCREEN     Drug Class       Cutoff (ng/mL)     Amphetamine      1000     Barbiturate      200     Benzodiazepine   696     Tricyclics       295     Opiates          300     Cocaine          300     THC              50     HPI : 68 y.o. African American female with history of diabetes appears to be not on any diabetic medications, COPD uncertain if patient is on home oxygen, arthritis, immune deficiency disorder of unclear etiology, hypertension, history of renal disorder of unclear etiology, and chronic steroid use of unclear etiology who presents with the above complaints. Patient is an extremely poor historian, she indicates that she has had weakness in her legs about a week ago. She also has been having left hip pain/back in the same period of time. She has been constipated again in the same period of time. She had nausea and vomiting once yesterday as a result she presented to the emergency department for further evaluation. Patient's labs were checked and her creatinine was 1.3, hospitalist service was asked to admit the patient for acute kidney injury. Patient denies any recent fevers, chills, chest pain, shortness of breath, diarrhea, headaches or vision changes.    HOSPITAL COURSE:  Left hip pain/back pain  Patient with history of chronic back pain. Unclear etiology for the cause for her left hip/flank/back pain. CT of abdomen and pelvis noted. Uncertain if constipation is causing her symptoms. Patient has good range of motion in her hip and legs. X-ray of the pelvis shows degenerative changes of the lumbosacral junction Physical therapy evaluation recommends 24-hour supervision with home health physical therapy B12 ordered and pending. Vitamin D deficiency. Prescribed vitamin D 50,000 units weekly, may benefit from outpatient bone  density scan  TSH borderline suppressed, normal free T4,  pending CK, doubt polymyalgia rheumatica because of normal ESR Prescribe prescription strength Tylenol for her pain  Constipation  Start the patient on stool softeners to determine if symptoms will improve. Next CT abdomen pelvis shows no signs of bowel obstruction Incidental 2.5 cm right common iliac artery aneurysm, size stable from 2013   Acute renal failure-prerenal Patient with history of renal disorder of unclear etiology. Suspect is likely due to dehydration from prerenal etiology or from nausea and vomiting. Stable on IV fluids. Diuretics were held upon admission, patient should resume Lasix at a lower dose after being evaluated by her PCP.  CT does not show any signs of hydronephrosis    Diabetes  Diabetic diet. Sliding scale insulin. Patient does not appear to be on any diabetic medications at home. Hemoglobin A1c of 6.0 in the prediabetic range. PCP to follow up and hemoglobin A1c in 3-6 months   Generalized weakness  Home health recommended by PT and OT evaluation.   Neuropathy?  Continue gabapentin.   Tobacco abuse  Counseled on cessation. UDS positive for marijuana  Constipation  Started the patient on bowel regimen. Continue senna/Colace  at home  Arthritis  Continue home medications including pain medications.  Hypertension  Continue home carvedilol. Holding home diuretics for now.   COPD with possible chronic respiratory failure  Uncertain if patient is on home oxygen. Check pulse oximetry on room air with ambulation   Chronic steroid use in a patient with history of immunodeficiency disorder of unclear etiology  Unclear etiology why patient is on steroids. Patient is a poor historian.  Pharmacy records indicate that she was prescribed prednisone last year there for discontinue       Discharge Exam: Blood pressure 107/63, pulse 72, temperature 98.3 F (36.8 C), temperature source Oral, resp. rate  18, height 5' 3"  (1.6 m), weight 45.5 kg (100 lb 5 oz), SpO2 93.00%. General: Awake, Oriented x3, No acute distress.  HEENT: EOMI, Moist mucous membranes  Neck: Supple  CV: S1 and S2  Lungs: Clear to ascultation bilaterally  Abdomen: Soft, Nontender, Nondistended, +bowel sounds.  Ext: Good pulses. Trace edema. No clubbing or cyanosis noted. Good right and left hip range of motion without pain.  Neuro: Cranial Nerves II-XII grossly intact. Has 5/5 motor strength in upper and lower extremities.         Discharge Instructions   Diet - low sodium heart healthy    Complete by:  As directed      Increase activity slowly    Complete by:  As directed            Follow-up Information   Follow up with PCP. Schedule an appointment as soon as possible for a visit in 1 week.      SignedReyne Dumas 01/11/2014, 11:37 AM

## 2014-01-11 NOTE — Progress Notes (Signed)
Agree with note. [phone] 1 [place] 01/11/2014 Martie Round, OTR/L Pager: [phone]

## 2014-01-11 NOTE — Progress Notes (Signed)
Patient discharged to home. Patient AVS reviewed with patient and patient's son. Patient and soncapable of verbalized understanding of medications and follow-up appointments.  Patient remains stable; no signs or symptoms of distress.  Patient educated to return to the ER in cases of SOB, dizziness, fever, chest pain, or fainting.

## 2014-01-19 LAB — CK ISOENZYMES
CK BB: 0 %
CK MB: 0 % (ref <5)
CK TOTAL: 28 U/L (ref 7–177)
CK-MM: 100 % (ref 95–100)
CREATINE KINASE-TOTAL: 28 U/L — AB (ref 29–143)

## 2014-01-19 NOTE — ED Provider Notes (Signed)
Medical screening examination/treatment/procedure(s) were performed by non-physician practitioner and as supervising physician I was immediately available for consultation/collaboration.    Nelia Shi, MD 01/19/14 [phone]

## 2014-06-21 ENCOUNTER — Encounter (HOSPITAL_COMMUNITY): Payer: Self-pay

## 2014-06-21 ENCOUNTER — Emergency Department (HOSPITAL_COMMUNITY): Payer: PRIVATE HEALTH INSURANCE

## 2014-06-21 ENCOUNTER — Inpatient Hospital Stay (HOSPITAL_COMMUNITY)
Admission: EM | Admit: 2014-06-21 | Discharge: 2014-06-23 | DRG: 291 | Disposition: A | Discharge status: Home Health | Payer: PRIVATE HEALTH INSURANCE | Attending: Internal Medicine | Admitting: Internal Medicine

## 2014-06-21 DIAGNOSIS — M25511 Pain in right shoulder: Secondary | ICD-10-CM

## 2014-06-21 DIAGNOSIS — Z9119 Patient's noncompliance with other medical treatment and regimen: Secondary | ICD-10-CM

## 2014-06-21 DIAGNOSIS — N183 Chronic kidney disease, stage 3 unspecified: Secondary | ICD-10-CM | POA: Diagnosis present

## 2014-06-21 DIAGNOSIS — N189 Chronic kidney disease, unspecified: Secondary | ICD-10-CM

## 2014-06-21 DIAGNOSIS — K5900 Constipation, unspecified: Secondary | ICD-10-CM | POA: Diagnosis present

## 2014-06-21 DIAGNOSIS — F17210 Nicotine dependence, cigarettes, uncomplicated: Secondary | ICD-10-CM | POA: Diagnosis present

## 2014-06-21 DIAGNOSIS — I509 Heart failure, unspecified: Secondary | ICD-10-CM

## 2014-06-21 DIAGNOSIS — M1990 Unspecified osteoarthritis, unspecified site: Secondary | ICD-10-CM | POA: Diagnosis present

## 2014-06-21 DIAGNOSIS — E785 Hyperlipidemia, unspecified: Secondary | ICD-10-CM | POA: Diagnosis present

## 2014-06-21 DIAGNOSIS — E119 Type 2 diabetes mellitus without complications: Secondary | ICD-10-CM | POA: Diagnosis present

## 2014-06-21 DIAGNOSIS — I129 Hypertensive chronic kidney disease with stage 1 through stage 4 chronic kidney disease, or unspecified chronic kidney disease: Secondary | ICD-10-CM | POA: Diagnosis present

## 2014-06-21 DIAGNOSIS — J449 Chronic obstructive pulmonary disease, unspecified: Secondary | ICD-10-CM | POA: Diagnosis present

## 2014-06-21 DIAGNOSIS — R0602 Shortness of breath: Secondary | ICD-10-CM | POA: Diagnosis present

## 2014-06-21 DIAGNOSIS — J9600 Acute respiratory failure, unspecified whether with hypoxia or hypercapnia: Secondary | ICD-10-CM | POA: Diagnosis present

## 2014-06-21 DIAGNOSIS — I5023 Acute on chronic systolic (congestive) heart failure: Secondary | ICD-10-CM | POA: Insufficient documentation

## 2014-06-21 DIAGNOSIS — I369 Nonrheumatic tricuspid valve disorder, unspecified: Secondary | ICD-10-CM

## 2014-06-21 DIAGNOSIS — J441 Chronic obstructive pulmonary disease with (acute) exacerbation: Secondary | ICD-10-CM | POA: Insufficient documentation

## 2014-06-21 HISTORY — DX: Heart failure, unspecified: I50.9

## 2014-06-21 HISTORY — DX: Reserved for inherently not codable concepts without codable children: IMO0001

## 2014-06-21 LAB — CBC
HEMATOCRIT: 40.8 % (ref 36.0–46.0)
Hemoglobin: 12.8 g/dL (ref 12.0–15.0)
MCH: 29.4 pg (ref 26.0–34.0)
MCHC: 31.4 g/dL (ref 30.0–36.0)
MCV: 93.6 fL (ref 78.0–100.0)
PLATELETS: 249 10*3/uL (ref 150–400)
RBC: 4.36 MIL/uL (ref 3.87–5.11)
RDW: 17.8 % — ABNORMAL HIGH (ref 11.5–15.5)
WBC: 5.6 10*3/uL (ref 4.0–10.5)

## 2014-06-21 LAB — CREATININE, SERUM
Creatinine, Ser: 1.1 mg/dL (ref 0.50–1.10)
GFR calc non Af Amer: 50 mL/min — ABNORMAL LOW (ref >90)
GFR, EST AFRICAN AMERICAN: 58 mL/min — AB (ref >90)

## 2014-06-21 LAB — GLUCOSE, CAPILLARY
Glucose-Capillary: 120 mg/dL — ABNORMAL HIGH (ref 70–99)
Glucose-Capillary: 158 mg/dL — ABNORMAL HIGH (ref 70–99)
Glucose-Capillary: 207 mg/dL — ABNORMAL HIGH (ref 70–99)

## 2014-06-21 LAB — COMPREHENSIVE METABOLIC PANEL
ALBUMIN: 3.3 g/dL — AB (ref 3.5–5.2)
ALK PHOS: 129 U/L — AB (ref 39–117)
ALT: 24 U/L (ref 0–35)
AST: 23 U/L (ref 0–37)
Anion gap: 10 (ref 5–15)
BILIRUBIN TOTAL: 0.3 mg/dL (ref 0.3–1.2)
BUN: 30 mg/dL — AB (ref 6–23)
CHLORIDE: 106 meq/L (ref 96–112)
CO2: 21 mEq/L (ref 19–32)
Calcium: 8.8 mg/dL (ref 8.4–10.5)
Creatinine, Ser: 1.22 mg/dL — ABNORMAL HIGH (ref 0.50–1.10)
GFR calc Af Amer: 51 mL/min — ABNORMAL LOW (ref >90)
GFR calc non Af Amer: 44 mL/min — ABNORMAL LOW (ref >90)
Glucose, Bld: 101 mg/dL — ABNORMAL HIGH (ref 70–99)
Potassium: 5.2 mEq/L (ref 3.7–5.3)
Sodium: 137 mEq/L (ref 137–147)
Total Protein: 6.3 g/dL (ref 6.0–8.3)

## 2014-06-21 LAB — CBC WITH DIFFERENTIAL/PLATELET
BASOS ABS: 0 10*3/uL (ref 0.0–0.1)
Basophils Relative: 0 % (ref 0–1)
EOS PCT: 2 % (ref 0–5)
Eosinophils Absolute: 0.1 10*3/uL (ref 0.0–0.7)
HEMATOCRIT: 41.8 % (ref 36.0–46.0)
HEMOGLOBIN: 12.7 g/dL (ref 12.0–15.0)
Lymphocytes Relative: 25 % (ref 12–46)
Lymphs Abs: 1.6 10*3/uL (ref 0.7–4.0)
MCH: 28 pg (ref 26.0–34.0)
MCHC: 30.4 g/dL (ref 30.0–36.0)
MCV: 92.1 fL (ref 78.0–100.0)
MONO ABS: 0.8 10*3/uL (ref 0.1–1.0)
MONOS PCT: 13 % — AB (ref 3–12)
NEUTROS ABS: 3.8 10*3/uL (ref 1.7–7.7)
Neutrophils Relative %: 60 % (ref 43–77)
Platelets: 240 10*3/uL (ref 150–400)
RBC: 4.54 MIL/uL (ref 3.87–5.11)
RDW: 17.9 % — AB (ref 11.5–15.5)
WBC: 6.2 10*3/uL (ref 4.0–10.5)

## 2014-06-21 LAB — HEMOGLOBIN A1C
Hgb A1c MFr Bld: 6.8 % — ABNORMAL HIGH (ref <5.7)
MEAN PLASMA GLUCOSE: 148 mg/dL — AB (ref <117)

## 2014-06-21 LAB — TSH: TSH: 1.05 u[IU]/mL (ref 0.350–4.500)

## 2014-06-21 LAB — PRO B NATRIURETIC PEPTIDE: Pro B Natriuretic peptide (BNP): 28259 pg/mL — ABNORMAL HIGH (ref 0–125)

## 2014-06-21 LAB — TROPONIN I: Troponin I: <0.3 ng/mL (ref <0.30)

## 2014-06-21 LAB — HIV ANTIBODY (ROUTINE TESTING W REFLEX): HIV 1&2 Ab, 4th Generation: NONREACTIVE

## 2014-06-21 MED ORDER — BUDESONIDE-FORMOTEROL FUMARATE 80-4.5 MCG/ACT IN AERO
2.0000 | INHALATION_SPRAY | Freq: Two times a day (BID) | RESPIRATORY_TRACT | Status: DC
  Administered 2014-06-21 – 2014-06-22 (×4): 2 via RESPIRATORY_TRACT
  Filled 2014-06-21: qty 6.9

## 2014-06-21 MED ORDER — METHYLPREDNISOLONE SODIUM SUCC 125 MG IJ SOLR
125.0000 mg | Freq: Two times a day (BID) | INTRAMUSCULAR | Status: DC
  Administered 2014-06-21 – 2014-06-22 (×3): 125 mg via INTRAVENOUS
  Filled 2014-06-21 (×5): qty 2

## 2014-06-21 MED ORDER — SODIUM CHLORIDE 0.9 % IJ SOLN: 3.0000 mL | INTRAMUSCULAR | Status: DC | PRN

## 2014-06-21 MED ORDER — ACETAMINOPHEN 325 MG PO TABS: 650.0000 mg | ORAL_TABLET | Freq: Four times a day (QID) | ORAL | Status: DC | PRN

## 2014-06-21 MED ORDER — IPRATROPIUM-ALBUTEROL 0.5-2.5 (3) MG/3ML IN SOLN: 3.0000 mL | RESPIRATORY_TRACT | Status: DC | PRN

## 2014-06-21 MED ORDER — IPRATROPIUM-ALBUTEROL 0.5-2.5 (3) MG/3ML IN SOLN
3.0000 mL | RESPIRATORY_TRACT | Status: DC | PRN
  Filled 2014-06-21: qty 3

## 2014-06-21 MED ORDER — SODIUM CHLORIDE 0.9 % IJ SOLN
3.0000 mL | Freq: Two times a day (BID) | INTRAMUSCULAR | Status: DC
  Administered 2014-06-21 – 2014-06-23 (×5): 3 mL via INTRAVENOUS

## 2014-06-21 MED ORDER — MIRTAZAPINE 30 MG PO TABS
30.0000 mg | ORAL_TABLET | Freq: Every day | ORAL | Status: DC
  Administered 2014-06-21 – 2014-06-22 (×2): 30 mg via ORAL
  Filled 2014-06-21 (×3): qty 1

## 2014-06-21 MED ORDER — POTASSIUM CHLORIDE CRYS ER 10 MEQ PO TBCR
10.0000 meq | EXTENDED_RELEASE_TABLET | Freq: Two times a day (BID) | ORAL | Status: DC
  Filled 2014-06-21 (×2): qty 1

## 2014-06-21 MED ORDER — FUROSEMIDE 10 MG/ML IJ SOLN
40.0000 mg | Freq: Three times a day (TID) | INTRAMUSCULAR | Status: DC
  Administered 2014-06-21 – 2014-06-22 (×4): 40 mg via INTRAVENOUS
  Filled 2014-06-21 (×3): qty 4

## 2014-06-21 MED ORDER — SENNOSIDES-DOCUSATE SODIUM 8.6-50 MG PO TABS
1.0000 | ORAL_TABLET | Freq: Two times a day (BID) | ORAL | Status: DC
  Administered 2014-06-21 – 2014-06-23 (×5): 1 via ORAL
  Filled 2014-06-21 (×6): qty 1

## 2014-06-21 MED ORDER — ROSUVASTATIN CALCIUM 40 MG PO TABS
40.0000 mg | ORAL_TABLET | Freq: Every day | ORAL | Status: DC
  Administered 2014-06-21 – 2014-06-23 (×3): 40 mg via ORAL
  Filled 2014-06-21 (×3): qty 1

## 2014-06-21 MED ORDER — HEPARIN SODIUM (PORCINE) 5000 UNIT/ML IJ SOLN
5000.0000 [IU] | Freq: Three times a day (TID) | INTRAMUSCULAR | Status: DC
  Administered 2014-06-21 – 2014-06-23 (×7): 5000 [IU] via SUBCUTANEOUS
  Filled 2014-06-21 (×8): qty 1

## 2014-06-21 MED ORDER — CARVEDILOL 12.5 MG PO TABS
12.5000 mg | ORAL_TABLET | Freq: Two times a day (BID) | ORAL | Status: DC
  Administered 2014-06-21 – 2014-06-23 (×5): 12.5 mg via ORAL
  Filled 2014-06-21 (×7): qty 1

## 2014-06-21 MED ORDER — DOXYCYCLINE HYCLATE 100 MG IV SOLR
100.0000 mg | Freq: Two times a day (BID) | INTRAVENOUS | Status: DC
  Administered 2014-06-21 – 2014-06-22 (×3): 100 mg via INTRAVENOUS
  Filled 2014-06-21 (×5): qty 100

## 2014-06-21 MED ORDER — IPRATROPIUM-ALBUTEROL 0.5-2.5 (3) MG/3ML IN SOLN
3.0000 mL | RESPIRATORY_TRACT | Status: DC
  Administered 2014-06-21 (×2): 3 mL via RESPIRATORY_TRACT
  Filled 2014-06-21: qty 3

## 2014-06-21 MED ORDER — IPRATROPIUM-ALBUTEROL 0.5-2.5 (3) MG/3ML IN SOLN
3.0000 mL | Freq: Three times a day (TID) | RESPIRATORY_TRACT | Status: DC
  Administered 2014-06-21 – 2014-06-22 (×4): 3 mL via RESPIRATORY_TRACT
  Filled 2014-06-21 (×5): qty 3

## 2014-06-21 MED ORDER — SODIUM CHLORIDE 0.9 % IJ SOLN
3.0000 mL | Freq: Two times a day (BID) | INTRAMUSCULAR | Status: DC
  Administered 2014-06-21: 3 mL via INTRAVENOUS

## 2014-06-21 MED ORDER — MORPHINE SULFATE 4 MG/ML IJ SOLN
4.0000 mg | Freq: Once | INTRAMUSCULAR | Status: AC
Start: 2014-06-21 — End: 2014-06-21
  Administered 2014-06-21: 4 mg via INTRAVENOUS
  Filled 2014-06-21: qty 1

## 2014-06-21 MED ORDER — GABAPENTIN 300 MG PO CAPS
300.0000 mg | ORAL_CAPSULE | Freq: Three times a day (TID) | ORAL | Status: DC
  Administered 2014-06-21 – 2014-06-23 (×7): 300 mg via ORAL
  Filled 2014-06-21 (×9): qty 1

## 2014-06-21 MED ORDER — SODIUM CHLORIDE 0.9 % IV SOLN: 250.0000 mL | INTRAVENOUS | Status: DC | PRN

## 2014-06-21 MED ORDER — POTASSIUM CHLORIDE CRYS ER 10 MEQ PO TBCR
10.0000 meq | EXTENDED_RELEASE_TABLET | Freq: Every day | ORAL | Status: DC
  Administered 2014-06-21 – 2014-06-23 (×3): 10 meq via ORAL
  Filled 2014-06-21 (×3): qty 1

## 2014-06-21 MED ORDER — FUROSEMIDE 10 MG/ML IJ SOLN
40.0000 mg | Freq: Once | INTRAMUSCULAR | Status: AC
  Administered 2014-06-21: 40 mg via INTRAVENOUS
  Filled 2014-06-21: qty 4

## 2014-06-21 MED ORDER — LISINOPRIL 5 MG PO TABS
5.0000 mg | ORAL_TABLET | Freq: Every day | ORAL | Status: DC
  Administered 2014-06-21 – 2014-06-23 (×3): 5 mg via ORAL
  Filled 2014-06-21 (×3): qty 1

## 2014-06-21 NOTE — ED Notes (Signed)
Attempted to call son, Kathi Simpers, at [phone].   Not able to leave a message.  Tried another number found in chart [phone] and left brief message advising family to call [phone].  Call to [phone] to advise of same and requests patient to try to call her family again later

## 2014-06-21 NOTE — ED Notes (Signed)
PT monitored by pulse ox, bp cuff, and 12-lead. 

## 2014-06-21 NOTE — ED Notes (Signed)
Patient up to bathroom.  Voided small amount of urine, yellow in color.  Patient noted to become sob with movement/activity.

## 2014-06-21 NOTE — ED Provider Notes (Signed)
CSN: [id number]     Arrival date & time 06/21/14  0149 History  This chart was scribed for [name] Mackielga M Carvel Huskins, MD by Bronson CurbJacqueline Melvin, ED Scribe. This patient was seen in room A13C/A13C and the patient's care was started at 2:18 AM.    Chief Complaint  Patient presents with  . Fatigue    The history is provided by the patient. No language interpreter was used.    HPI Comments: [name] [name] is a 68 y.o. female, with history of DM, COPD, and HTN, brought in by ambulance, who presents to the Emergency Department complaining of fatigue onset PTA. Patient reports sehw was recently admitted at [facility] for the same. She states her medications were not changed and reports her symptoms have not resolved. She is compliant with her medications. There is associated SOB, cough, and right shoulder pain. Patient reports history of anemia, and suspects this is related to her symptoms. She denies fever.  She denies trauma to the shoulder.  Pain mainly right posterior scapula.      Past Medical History  Diagnosis Date  . Diabetes mellitus without complication   . COPD (chronic obstructive pulmonary disease)   . Arthritis     legs, shoulders  . Immune deficiency disorder   . Hypertension   . Renal disorder    History reviewed. No pertinent past surgical history. Family History  Problem Relation Age of Onset  . Other Mother     Per patient died from old age.  . Other Father     Per patient died from old age.   History  Substance Use Topics  . Smoking status: Current Every Day Smoker -- 0.50 packs/day    Types: Cigarettes  . Smokeless tobacco: Not on file  . Alcohol Use: No   OB History    No data available     Review of Systems  Constitutional: Positive for fatigue. Negative for fever.  Respiratory: Positive for cough and shortness of breath.   Musculoskeletal: Positive for arthralgias (right shoulder).      Allergies  Review of patient's allergies indicates no known  allergies.  Home Medications   Prior to Admission medications   Medication Sig Start Date End Date Taking? Authorizing Provider  acetaminophen (TYLENOL) 325 MG tablet Take 2 tablets (650 mg total) by mouth every 6 (six) hours as needed for mild pain (or Fever >/= 101). 01/11/14   Richarda OverlieNayana Abrol, MD  albuterol (PROVENTIL HFA;VENTOLIN HFA) 108 (90 BASE) MCG/ACT inhaler Inhale 2 puffs into the lungs 4 (four) times daily as needed for wheezing or shortness of breath.    Historical Provider, MD  bisacodyl (DULCOLAX) 5 MG EC tablet Take 5 mg by mouth daily as needed (constipation).    Historical Provider, MD  budesonide-formoterol (SYMBICORT) 80-4.5 MCG/ACT inhaler Inhale 2 puffs into the lungs 2 (two) times daily.    Historical Provider, MD  carvedilol (COREG) 12.5 MG tablet Take 12.5 mg by mouth 2 (two) times daily with a meal.    Historical Provider, MD  ergocalciferol (VITAMIN D2) 50000 UNITS capsule Take 1 capsule (50,000 Units total) by mouth once a week. 01/11/14   Richarda OverlieNayana Abrol, MD  furosemide (LASIX) 40 MG tablet Take 0.5 tablets (20 mg total) by mouth daily. 01/11/14   Richarda OverlieNayana Abrol, MD  gabapentin (NEURONTIN) 300 MG capsule Take 300 mg by mouth 3 (three) times daily.    Historical Provider, MD  lisinopril (PRINIVIL,ZESTRIL) 5 MG tablet Take 5 mg by mouth daily.  Historical Provider, MD  mirtazapine (REMERON) 30 MG tablet Take 30 mg by mouth at bedtime.    Historical Provider, MD  potassium chloride (K-DUR,KLOR-CON) 10 MEQ tablet Take 10 mEq by mouth 2 (two) times daily.    Historical Provider, MD  senna-docusate (SENOKOT-S) 8.6-50 MG per tablet Take 1 tablet by mouth 2 (two) times daily. 01/11/14   Richarda Overlie, MD   Triage Vitals: BP 121/83 mmHg  Pulse 74  Temp(Src) 97.5 F (36.4 C) (Oral)  Resp 17  SpO2 99%  Physical Exam  Constitutional: She is oriented to person, place, and time. She appears well-developed and well-nourished.  HENT:  Head: Normocephalic and atraumatic.  Nose: Nose  normal.  Mouth/Throat: Oropharynx is clear and moist.  Eyes: Conjunctivae and EOM are normal. Pupils are equal, round, and reactive to light.  Neck: Normal range of motion. Neck supple. No JVD present. No tracheal deviation present. No thyromegaly present.  Cardiovascular: Normal rate, regular rhythm, normal heart sounds and intact distal pulses.  Exam reveals no gallop and no friction rub.   No murmur heard. Pulmonary/Chest: Effort normal. No stridor. No respiratory distress. Wheezes: asymmetric, right worse than left. She has rales. She exhibits no tenderness.  Abdominal: Soft. Bowel sounds are normal. She exhibits no distension and no mass. There is no tenderness. There is no rebound and no guarding.  Musculoskeletal: Normal range of motion. She exhibits edema. She exhibits no tenderness.  Lymphadenopathy:    She has no cervical adenopathy.  Neurological: She is alert and oriented to person, place, and time. She displays normal reflexes. She exhibits normal muscle tone. Coordination normal.  Skin: Skin is warm and dry. No rash noted. No erythema. No pallor.  Psychiatric: She has a normal mood and affect. Her behavior is normal. Judgment and thought content normal.  Nursing note and vitals reviewed.   ED Course  Procedures (including critical care time)  DIAGNOSTIC STUDIES: Oxygen Saturation is 99% on room air, normal by my interpretation.    COORDINATION OF CARE: At 0224 Discussed treatment plan with patient which includes CXR and labs. Patient agrees.   Labs Review Labs Reviewed  COMPREHENSIVE METABOLIC PANEL - Abnormal; Notable for the following:    Glucose, Bld 101 (*)    BUN 30 (*)    Creatinine, Ser 1.22 (*)    Albumin 3.3 (*)    Alkaline Phosphatase 129 (*)    GFR calc non Af Amer 44 (*)    GFR calc Af Amer 51 (*)    All other components within normal limits  CBC WITH DIFFERENTIAL - Abnormal; Notable for the following:    RDW 17.9 (*)    Monocytes Relative 13 (*)     All other components within normal limits  PRO B NATRIURETIC PEPTIDE - Abnormal; Notable for the following:    Pro B Natriuretic peptide (BNP) 28259.0 (*)    All other components within normal limits  TROPONIN I    Imaging Review Dg Chest 2 View  06/21/2014   CLINICAL DATA:  RIGHT lower chest pain, shortness of breath and fatigue.  EXAM: CHEST  2 VIEW  COMPARISON:  Chest radiograph June 02, 2014  FINDINGS: The cardiac silhouette appears moderate to severely enlarged, similar. Heavily calcified aortic knob. Similar interstitial prominence. Small pleural effusions. Patchy bibasilar airspace opacities, slightly decreased. No pneumothorax.  Soft tissue planes and included osseous structures are nonsuspicious. Mild degenerative change of thoracic spine. Linear calcification within the RIGHT neck are likely vascular.  IMPRESSION: Stable cardiomegaly  with interstitial prominence most consistent with pulmonary edema. Improved aeration of the lung bases with small pleural effusions.   Electronically Signed   By: Awilda Metro   On: 06/21/2014 03:09     EKG Interpretation   Date/Time:  Tuesday June 21 2014 01:55:44 EST Ventricular Rate:  75 PR Interval:  153 QRS Duration: 82 QT Interval:  389 QTC Calculation: 434 R Axis:   2 Text Interpretation:  Sinus rhythm Supraventricular bigeminy Abnormal  R-wave progression, late transition Consider left ventricular hypertrophy  Nonspecific T abnormalities, lateral leads Confirmed by Jaydin Boniface  MD, Latreshia Beauchaine  ([id number]) on 06/21/2014 2:07:09 AM      MDM   Final diagnoses:  CHF exacerbation  Right shoulder pain    68 year old female who presents via EMS with worsening shortness of breath.  She reports recent admission to [facility] for same.  Patient with pitting edema and rales on exam.  Asymmetric right greater than left.  I suspect patient has been noncompliant with medications.  We'll get records from [facility], lab work, chest  x-ray and will give a dose of IV Lasix.  Shoulder pain appears to be musculoskeletal.  Expect patient will need be admitted to the hospital.  I personally performed the services described in this documentation, which was scribed in my presence. The recorded information has been reviewed and is accurate.     [name] Mackie, MD 06/21/14 [phone]

## 2014-06-21 NOTE — ED Notes (Signed)
Patient with pain in the right side of her chest that radiates into her back.  Patient states she had been pain free until she got up and then we "messed with her" doing blood work and meds.  Iv is patent.  meds pushed slowly with  Iv fluids.  Repeat EKG completed

## 2014-06-21 NOTE — ED Notes (Signed)
DR otter reviewed ekg.  Patient has had right sided pain upon arrival

## 2014-06-21 NOTE — Progress Notes (Signed)
  Echocardiogram 2D Echocardiogram has been performed.  Cathie Beams 06/21/2014, 3:36 PM

## 2014-06-21 NOTE — Progress Notes (Signed)
Patient admitted earlier this morning by Dr. Doree Albee.  This is a 68 year old female history of chronic systolic heart failure EF 20%, type 2 diabetes mellitus, COPD who presented to the emergency department with complaints of acute respiratory failure.  Patient seen and examined, currently stable. Blood pressure 114/70, pulse 60, temperature 97.6 F (36.4 C), temperature source Oral, resp. rate 18, height 5\' 3"  (1.6 m), weight 59.4 kg (130 lb 15.3 oz), SpO2 93 %.  Agree with current assessment and plan done by Dr. Doree Albee.  Will continue to treat patient for acute respiratory failure likely secondary to multifactorial causes including acute on chronic systolic heart failure versus COPD. Continue IV Solu-Medrol, doxycycline, duo nebs and supplemental oxygen as needed. Will continue to monitor I's and O's and weights. Echocardiogram currently pending.  Time spent: 20 minutes  Ceferino Lang D.O. [facility] Pager [phone]  If 7PM-7AM, please contact night-coverage [email or web address] Password [email or web address] 06/21/2014, 12:01 PM

## 2014-06-21 NOTE — ED Notes (Signed)
Patient denies any pain at this time.  Valuables sent to security to lock up.  Home meds inventoried and sent to pharamcy.  Patient aware of same.

## 2014-06-21 NOTE — H&P (Addendum)
Hospitalist Admission History and Physical  Patient name: [name] [name] Medical record number: [id number] Date of birth: Dec 17, 1945 Age: 69 y.o. Gender: female  Primary Care Provider: No primary care provider on file.  Chief Complaint: acute resp failure  History of Present Illness:This is a 68 y.o. year old female with significant past medical history of chronic systolic heart failure with EF 20%, type 2 diabetes, COPD presenting with acute respiratory failure. Patient usually followed to the [facility] medical system. In review the chart, patient has recurrent history of medical noncompliance. States that she's had increased work of breathing over an unspecified amount of time. Noted last admission in review of the [place] chart 06/01/2014. Admitted for similar symptoms. Reports increased work of breathing, wheezing, cough. Mild right shoulder pain. No nausea or vomiting. States that she has been compliant with her medication regimen. Does admit to high salt intake. Still smoking. States that she's not going to the [facility] system anymore because they don't take care of her. Present to the ER Tmax 97.9, heart rate in the 30s to 70s, respirations in the tens to 20s, blood pressure in the 120s. Satting greater than 97% on room air. White blood cell count 6.2, hemoglobin 12.7, creatinine 1.2. ProBNP 24,000. Troponin negative 1. EKG shows sinus rhythm with supraventricular bigeminy. Chest x-ray shows stable cardiomegaly with interstitial prominence most consistent with pulmonary edema. Small bilateral pleural effusions.  Assessment and Plan: [name] [name] is a 64 y.o. year old female presenting with Acute resp failure   Active Problems:   CHF (congestive heart failure)   Acute respiratory failure   1- Acute resp failure  -Likely multifactorial with contributions of acute on chronic systolic heart failure and COPD Status post 40 mg of IV Lasix in the ER -Watch urine output -IV  Solu-Medrol -IV doxycycline -Duo nebs -Supplemental oxygen as needed -No hypoxia or respiratory distress currently  2- Chronic systolic heart failure  - Noted EF 20% in review of medical record -not a candidate for AICD given hx/o noncompliance per records -2D ECHO -tele bed -diurese  -consider cards c/s in house   3- COPD  -IV solumedrol  -duonebs -IV doxycycline  4- DM  -SSI  -A1C   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 CHF (congestive heart failure) 06/21/2014  . Acute respiratory failure 06/21/2014  . Protein-calorie malnutrition, severe 01/11/2014  . Acute renal injury 01/10/2014  . Left hip pain 01/10/2014  . Tobacco abuse 01/10/2014  . Unspecified constipation 01/10/2014  . Diabetes mellitus without complication   . COPD (chronic obstructive pulmonary disease)   . Hypertension   . Arthritis    Past Medical History: Past Medical History  Diagnosis Date  . Diabetes mellitus without complication   . COPD (chronic obstructive pulmonary disease)   . Arthritis     legs, shoulders  . Immune deficiency disorder   . Hypertension   . Renal disorder     Past Surgical History: History reviewed. No pertinent past surgical history.  Social History: History   Social History  . Marital Status: Married    Spouse Name: N/A    Number of Children: N/A  . Years of Education: N/A   Social History Main Topics  . Smoking status: Current Every Day Smoker -- 0.50 packs/day  Types: Cigarettes  . Smokeless tobacco: None  . Alcohol Use: No  . Drug Use: Yes    Special: Marijuana  . Sexual Activity: None   Other Topics Concern  . None   Social History Narrative    Family History: Family History  Problem Relation Age of Onset  . Other Mother     Per patient died from old age.   . Other Father     Per patient died from old age.    Allergies: No Known Allergies  Current Facility-Administered Medications  Medication Dose Route Frequency Provider Last Rate Last Dose  . 0.9 %  sodium chloride infusion  250 mL Intravenous PRN Doree AlbeeSteven Kyira Volkert, MD      . acetaminophen (TYLENOL) tablet 650 mg  650 mg Oral Q6H PRN Doree AlbeeSteven Shraga Custard, MD      . budesonide-formoterol (SYMBICORT) 80-4.5 MCG/ACT inhaler 2 puff  2 puff Inhalation BID Doree AlbeeSteven Joleah Kosak, MD      . carvedilol (COREG) tablet 12.5 mg  12.5 mg Oral BID WC Doree AlbeeSteven Charlis Harner, MD      . furosemide (LASIX) injection 40 mg  40 mg Intravenous Once Olivia Mackielga M Otter, MD      . furosemide (LASIX) injection 40 mg  40 mg Intravenous Q8H Doree AlbeeSteven Peony Barner, MD      . gabapentin (NEURONTIN) capsule 300 mg  300 mg Oral TID Doree AlbeeSteven Ashaz Robling, MD      . heparin injection 5,000 Units  5,000 Units Subcutaneous 3 times per day Doree AlbeeSteven Dariela Stoker, MD      . ipratropium-albuterol (DUONEB) 0.5-2.5 (3) MG/3ML nebulizer solution 3 mL  3 mL Nebulization Q4H Doree AlbeeSteven Kaydense Rizo, MD      . ipratropium-albuterol (DUONEB) 0.5-2.5 (3) MG/3ML nebulizer solution 3 mL  3 mL Nebulization Q2H PRN Doree AlbeeSteven Saliou Barnier, MD      . lisinopril (PRINIVIL,ZESTRIL) tablet 5 mg  5 mg Oral Daily Doree AlbeeSteven Paulo Keimig, MD      . methylPREDNISolone sodium succinate (SOLU-MEDROL) 125 mg/2 mL injection 125 mg  125 mg Intravenous Q12H Doree AlbeeSteven Rosemary Pentecost, MD      . mirtazapine (REMERON) tablet 30 mg  30 mg Oral QHS Doree AlbeeSteven Shant Hence, MD      . potassium chloride SA (K-DUR,KLOR-CON) CR tablet 10 mEq  10 mEq Oral BID Doree AlbeeSteven Donnice Nielsen, MD      . rosuvastatin (CRESTOR) tablet 40 mg  40 mg Oral Daily Doree AlbeeSteven Oluwadarasimi Redmon, MD      . senna-docusate (Senokot-S) tablet 1 tablet  1 tablet Oral BID Doree AlbeeSteven Gillermo Poch, MD      . sodium chloride 0.9 % injection 3 mL  3 mL Intravenous Q12H Doree AlbeeSteven Senta Kantor, MD      . sodium chloride 0.9 % injection 3 mL  3 mL Intravenous Q12H Doree AlbeeSteven Marcelus Dubberly, MD      . sodium chloride 0.9 % injection 3 mL  3 mL Intravenous PRN  Doree AlbeeSteven Dalores Weger, MD       Current Outpatient Prescriptions  Medication Sig Dispense Refill  . carvedilol (COREG) 12.5 MG tablet Take 12.5 mg by mouth 2 (two) times daily with a meal.    . diphenhydramine-acetaminophen (TYLENOL PM) 25-500 MG TABS Take 1 tablet by mouth at bedtime as needed (for sleep).    Marland Kitchen. lisinopril (PRINIVIL,ZESTRIL) 5 MG tablet Take 5 mg by mouth daily.    . mirtazapine (REMERON) 30 MG tablet Take 30 mg by mouth at bedtime.    . potassium chloride (K-DUR,KLOR-CON) 10 MEQ tablet Take 10 mEq by mouth 2 (two) times daily.    .Marland Kitchen  predniSONE (DELTASONE) 20 MG tablet Take 20 mg by mouth daily with breakfast.    . rosuvastatin (CRESTOR) 40 MG tablet Take 40 mg by mouth daily.    Marland Kitchen acetaminophen (TYLENOL) 325 MG tablet Take 2 tablets (650 mg total) by mouth every 6 (six) hours as needed for mild pain (or Fever >/= 101). (Patient not taking: Reported on 06/21/2014) 60 tablet 2  . albuterol (PROVENTIL HFA;VENTOLIN HFA) 108 (90 BASE) MCG/ACT inhaler Inhale 2 puffs into the lungs 4 (four) times daily as needed for wheezing or shortness of breath.    . bisacodyl (DULCOLAX) 5 MG EC tablet Take 5 mg by mouth daily as needed (constipation).    . budesonide-formoterol (SYMBICORT) 80-4.5 MCG/ACT inhaler Inhale 2 puffs into the lungs 2 (two) times daily.    . ergocalciferol (VITAMIN D2) 50000 UNITS capsule Take 1 capsule (50,000 Units total) by mouth once a week. (Patient not taking: Reported on 06/21/2014) 8 capsule 12  . furosemide (LASIX) 40 MG tablet Take 0.5 tablets (20 mg total) by mouth daily. (Patient not taking: Reported on 06/21/2014) 30 tablet 2  . gabapentin (NEURONTIN) 300 MG capsule Take 300 mg by mouth 3 (three) times daily.    Marland Kitchen senna-docusate (SENOKOT-S) 8.6-50 MG per tablet Take 1 tablet by mouth 2 (two) times daily. (Patient not taking: Reported on 06/21/2014) 60 tablet 2   Review Of Systems: 12 point ROS negative except as noted above in HPI.  Physical Exam: Filed Vitals:    06/21/14 0451  BP:   Pulse:   Temp: 97.9 F (36.6 C)  Resp:     General: alert and cooperative HEENT: PERRLA and extra ocular movement intact Heart: S1, S2 normal, no murmur, rub or gallop, regular rate and rhythm Lungs: unlabored breathing, expiratory wheezes and bibasilar rales Abdomen: abdomen is soft without significant tenderness, masses, organomegaly or guarding Extremities: 2+ edema in LE bilaterally  Skin:no rashes Neurology: normal without focal findings  Labs and Imaging: Lab Results  Component Value Date/Time   NA 137 06/21/2014 02:25 AM   K 5.2 06/21/2014 02:25 AM   CL 106 06/21/2014 02:25 AM   CO2 21 06/21/2014 02:25 AM   BUN 30* 06/21/2014 02:25 AM   CREATININE 1.22* 06/21/2014 02:25 AM   GLUCOSE 101* 06/21/2014 02:25 AM   Lab Results  Component Value Date   WBC 6.2 06/21/2014   HGB 12.7 06/21/2014   HCT 41.8 06/21/2014   MCV 92.1 06/21/2014   PLT 240 06/21/2014    Dg Chest 2 View  06/21/2014   CLINICAL DATA:  RIGHT lower chest pain, shortness of breath and fatigue.  EXAM: CHEST  2 VIEW  COMPARISON:  Chest radiograph June 02, 2014  FINDINGS: The cardiac silhouette appears moderate to severely enlarged, similar. Heavily calcified aortic knob. Similar interstitial prominence. Small pleural effusions. Patchy bibasilar airspace opacities, slightly decreased. No pneumothorax.  Soft tissue planes and included osseous structures are nonsuspicious. Mild degenerative change of thoracic spine. Linear calcification within the RIGHT neck are likely vascular.  IMPRESSION: Stable cardiomegaly with interstitial prominence most consistent with pulmonary edema. Improved aeration of the lung bases with small pleural effusions.   Electronically Signed   By: Awilda Metro   On: 06/21/2014 03:09           Doree Albee MD  Pager: [phone]

## 2014-06-22 DIAGNOSIS — J441 Chronic obstructive pulmonary disease with (acute) exacerbation: Secondary | ICD-10-CM

## 2014-06-22 DIAGNOSIS — I5023 Acute on chronic systolic (congestive) heart failure: Principal | ICD-10-CM

## 2014-06-22 DIAGNOSIS — J189 Pneumonia, unspecified organism: Secondary | ICD-10-CM

## 2014-06-22 DIAGNOSIS — N183 Chronic kidney disease, stage 3 (moderate): Secondary | ICD-10-CM

## 2014-06-22 LAB — CBC WITH DIFFERENTIAL/PLATELET
Basophils Absolute: 0 10*3/uL (ref 0.0–0.1)
Basophils Relative: 0 % (ref 0–1)
Eosinophils Absolute: 0 10*3/uL (ref 0.0–0.7)
Eosinophils Relative: 0 % (ref 0–5)
HEMATOCRIT: 40 % (ref 36.0–46.0)
HEMOGLOBIN: 12.6 g/dL (ref 12.0–15.0)
LYMPHS ABS: 0.4 10*3/uL — AB (ref 0.7–4.0)
LYMPHS PCT: 13 % (ref 12–46)
MCH: 29.1 pg (ref 26.0–34.0)
MCHC: 31.5 g/dL (ref 30.0–36.0)
MCV: 92.4 fL (ref 78.0–100.0)
MONO ABS: 0.1 10*3/uL (ref 0.1–1.0)
MONOS PCT: 5 % (ref 3–12)
Neutro Abs: 2.5 10*3/uL (ref 1.7–7.7)
Neutrophils Relative %: 82 % — ABNORMAL HIGH (ref 43–77)
PLATELETS: 241 10*3/uL (ref 150–400)
RBC: 4.33 MIL/uL (ref 3.87–5.11)
RDW: 17.4 % — ABNORMAL HIGH (ref 11.5–15.5)
WBC: 3 10*3/uL — AB (ref 4.0–10.5)

## 2014-06-22 LAB — COMPREHENSIVE METABOLIC PANEL
ALBUMIN: 2.6 g/dL — AB (ref 3.5–5.2)
ALT: 18 U/L (ref 0–35)
AST: 17 U/L (ref 0–37)
Alkaline Phosphatase: 111 U/L (ref 39–117)
Anion gap: 16 — ABNORMAL HIGH (ref 5–15)
BUN: 33 mg/dL — ABNORMAL HIGH (ref 6–23)
CALCIUM: 8.4 mg/dL (ref 8.4–10.5)
CO2: 21 meq/L (ref 19–32)
Chloride: 102 mEq/L (ref 96–112)
Creatinine, Ser: 1.2 mg/dL — ABNORMAL HIGH (ref 0.50–1.10)
GFR calc Af Amer: 52 mL/min — ABNORMAL LOW (ref >90)
GFR, EST NON AFRICAN AMERICAN: 45 mL/min — AB (ref >90)
Glucose, Bld: 302 mg/dL — ABNORMAL HIGH (ref 70–99)
Potassium: 4.7 mEq/L (ref 3.7–5.3)
SODIUM: 139 meq/L (ref 137–147)
Total Bilirubin: 0.3 mg/dL (ref 0.3–1.2)
Total Protein: 5.4 g/dL — ABNORMAL LOW (ref 6.0–8.3)

## 2014-06-22 LAB — GLUCOSE, CAPILLARY
Glucose-Capillary: 329 mg/dL — ABNORMAL HIGH (ref 70–99)
Glucose-Capillary: 255 mg/dL — ABNORMAL HIGH (ref 70–99)

## 2014-06-22 MED ORDER — INSULIN ASPART 100 UNIT/ML ~~LOC~~ SOLN
0.0000 [IU] | Freq: Every day | SUBCUTANEOUS | Status: DC
Start: 2014-06-22 — End: 2014-06-23
  Administered 2014-06-22: 3 [IU] via SUBCUTANEOUS

## 2014-06-22 MED ORDER — DOXYCYCLINE HYCLATE 100 MG PO TABS
100.0000 mg | ORAL_TABLET | Freq: Two times a day (BID) | ORAL | Status: DC
  Administered 2014-06-22 – 2014-06-23 (×2): 100 mg via ORAL
  Filled 2014-06-22 (×3): qty 1

## 2014-06-22 MED ORDER — PREDNISONE 50 MG PO TABS
60.0000 mg | ORAL_TABLET | Freq: Every day | ORAL | Status: DC
  Administered 2014-06-23: 60 mg via ORAL
  Filled 2014-06-22 (×2): qty 1

## 2014-06-22 MED ORDER — FUROSEMIDE 40 MG PO TABS
40.0000 mg | ORAL_TABLET | Freq: Every day | ORAL | Status: DC
  Administered 2014-06-22 – 2014-06-23 (×2): 40 mg via ORAL
  Filled 2014-06-22 (×2): qty 1

## 2014-06-22 MED ORDER — INSULIN ASPART 100 UNIT/ML ~~LOC~~ SOLN
0.0000 [IU] | Freq: Three times a day (TID) | SUBCUTANEOUS | Status: DC
Start: 2014-06-22 — End: 2014-06-23
  Administered 2014-06-22: 11 [IU] via SUBCUTANEOUS
  Administered 2014-06-23: 3 [IU] via SUBCUTANEOUS

## 2014-06-22 NOTE — Evaluation (Signed)
Physical Therapy Evaluation Patient Details Name: [name] PatellaShirley Fye MRN: [id number] DOB: 1945/11/15 Today's Date: 06/22/2014   History of Present Illness  Pt is a 68 y.o. year old female with significant PMH of chronic systolic heart failure with EF 20%, type 2 diabetes, COPD presenting with acute respiratory failure. In review the chart, patient has recurrent history of medical noncompliance. States that she's had increased work of breathing over an unspecified amount of time. Chest x-ray shows stable cardiomegaly with interstitial prominence most consistent with pulmonary edema. Small bilateral pleural effusions.  Clinical Impression  Pt admitted with the above. Pt currently with functional limitations due to the deficits listed below (see PT Problem List). At the time of PT eval pt was able to perform transfers and ambulation with steadying assist.  When PT entered pt did not have supplemental O2 donned. Ambulated in hall without report of dizziness, lightheadedness, or SOB (very end of gait training). Upon return to room O2 sats were at 68%. Supplemental O2 was donned and sats returned to >90% in ~2 mins. Pt will benefit from skilled PT to increase their independence and safety with mobility to allow discharge to the venue listed below.     Follow Up Recommendations Home health PT;Supervision/Assistance - 24 hour    Equipment Recommendations  None recommended by PT    Recommendations for Other Services       Precautions / Restrictions Precautions Precautions: Fall; watch O2 sats - pt often takes O2 off but needs to wear it as sats drop quickly with any mobility Restrictions Weight Bearing Restrictions: No      Mobility  Bed Mobility Overal bed mobility: Needs Assistance Bed Mobility: Supine to Sit     Supine to sit: Supervision     General bed mobility comments: Pt able to transition to EOB with no physical assistance. Supervision for safety.   Transfers Overall transfer  level: Needs assistance Equipment used: None Transfers: Sit to/from Stand Sit to Stand: Supervision         General transfer comment: Pt was able to power-up to full standing position with no noted unsteadiness or LOB.   Ambulation/Gait Ambulation/Gait assistance: Min assist Ambulation Distance (Feet): 450 Feet Assistive device: None Gait Pattern/deviations: Step-through pattern;Decreased stride length;Staggering left;Staggering right Gait velocity: Decreased Gait velocity interpretation: Below normal speed for age/gender General Gait Details: Pt required steadying assist throughout gait training. Pt was cued for increased gait speed, however was not able to make corrective changes. Pt reports no dizziness, lightheadedness. SOB at very end of gait training.  Stairs            Wheelchair Mobility    Modified Rankin (Stroke Patients Only)       Balance Overall balance assessment: Needs assistance Sitting-balance support: Feet supported;No upper extremity supported Sitting balance-Leahy Scale: Fair     Standing balance support: No upper extremity supported;During functional activity Standing balance-Leahy Scale: Fair                               Pertinent Vitals/Pain Pain Assessment: No/denies pain    Home Living Family/patient expects to be discharged to:: Private residence Living Arrangements: Non-relatives/Friends   Type of Home: Apartment Home Access: Level entry     Home Layout: One level Home Equipment: Walker - 2 wheels Additional Comments: Pt states that she has 2 walkers at home however she does not use them. On home O2 all the time - pt reports  1-2L/min. Pt is very vague about assistance available at d/c, and does not give therapist a clear answer.     Prior Function Level of Independence: Independent               Hand Dominance   Dominant Hand: Right    Extremity/Trunk Assessment   Upper Extremity Assessment: Defer to OT  evaluation           Lower Extremity Assessment: Generalized weakness      Cervical / Trunk Assessment: Normal  Communication   Communication: No difficulties  Cognition Arousal/Alertness: Awake/alert Behavior During Therapy: WFL for tasks assessed/performed Overall Cognitive Status: Within Functional Limits for tasks assessed                      General Comments      Exercises        Assessment/Plan    PT Assessment Patient needs continued PT services  PT Diagnosis Difficulty walking;Generalized weakness   PT Problem List Decreased strength;Decreased range of motion;Decreased activity tolerance;Decreased balance;Decreased mobility;Decreased knowledge of use of DME;Decreased safety awareness;Decreased knowledge of precautions;Cardiopulmonary status limiting activity  PT Treatment Interventions DME instruction;Gait training;Stair training;Functional mobility training;Therapeutic activities;Therapeutic exercise;Neuromuscular re-education;Patient/family education   PT Goals (Current goals can be found in the Care Plan section) Acute Rehab PT Goals Patient Stated Goal: Pt did not state goals. Was fixed on getting her [place] sandwich PT Goal Formulation: With patient Time For Goal Achievement: 06/29/14 Potential to Achieve Goals: Good    Frequency Min 3X/week   Barriers to discharge        Co-evaluation               End of Session Equipment Utilized During Treatment: Gait belt;Oxygen Activity Tolerance: Patient tolerated treatment well Patient left: in chair;with chair alarm set;with call bell/phone within reach Nurse Communication: Mobility status         Time: 4818-5631 PT Time Calculation (min) (ACUTE ONLY): 29 min   Charges:   PT Evaluation $Initial PT Evaluation Tier I: 1 Procedure PT Treatments $Gait Training: 8-22 mins $Therapeutic Activity: 8-22 mins   PT G Codes:          Conni Slipper 06/22/2014, 4:14 PM   Conni Slipper,  PT, DPT Acute Rehabilitation Services Pager: [phone]

## 2014-06-22 NOTE — Plan of Care (Signed)
Problem: Phase I Progression Outcomes Goal: Tolerating diet Outcome: Completed/Met Date Met:  06/22/14     

## 2014-06-22 NOTE — Progress Notes (Signed)
Inpatient Diabetes Program Recommendations  AACE/ADA: New Consensus Statement on Inpatient Glycemic Control (2013)  Target Ranges:  Prepandial:   less than 140 mg/dL      Peak postprandial:   less than 180 mg/dL (1-2 hours)      Critically ill patients:  140 - 180 mg/dL   Reason for Assessment:   Results for EVEREST, LOO (MRN [id number]) as of 06/22/2014 14:41  Ref. Range 06/22/2014 03:18  Glucose Latest Range: 70-99 mg/dL 342 (H)   Diabetes history: Diabetes Mellitus Outpatient Diabetes medications:  None noted Current orders for Inpatient glycemic control:  None-  Prednisone 60 mg daily  MD please consider adding Novolog moderate tid with meals  Thanks, Beryl Meager, RN, BC-ADM Inpatient Diabetes Coordinator Pager [phone]

## 2014-06-22 NOTE — Progress Notes (Signed)
[facility] PROGRESS NOTE  [name] [name] ZOX:[id number] DOB: 1945/10/17 DOA: 06/21/2014 PCP: No primary care provider on file.  Assessment/Plan: 1. Acute on chronic systolic congestive heart failure -Patient presenting with respiratory distress -Chest x-ray performed on 06/21/2014 showing ejection fraction of 20-25% with diffuse hypokinesis -She has diuresed well in the past 24 hours having a net negative fluid balance of 3.8 L -Showing clinical improvement by 06/22/2014 -She was initially started on Lasix 40 mg IV every 8 hours, will transition to oral Lasix at 40 mg by mouth daily given response  2.  COPD exacerbation -I suspect COPD exacerbation may have contributed to her respiratory distress. It is possible that underlying CHF may have precipitated COPD  -Will transition to prednisone 60 mg by mouth daily, continue scheduled duo nebs and empiric antimicrobial therapy with doxycycline  3.  Hypertension  -Continue carvedilol 12.5 mg by mouth twice a day and lisinopril 5 mg by mouth daily   4.  Dyslipidemia -Continue Crestor 40 mg by mouth daily  Code Status: Full code  Family Communication:  Disposition Plan: Anticipate discharge in the next 24 hours if she continues to improve   Antibiotics:  Doxycycline   HPI/Subjective: Patient is a pleasant 68 year old female with a past medical history of systolic congestive heart failure, having transthoracic echocardiogram performed on 06/21/2014 showing ejection fraction of 20-25% with diffuse hypokinesis, chronic obstructive pulmonary disease, admitted to the medicine service on 06/21/2014 presenting with complaints of shortness of breath. Initial chest x-ray revealed stable cardiomegaly with interstitial prominence most consistent with pulmonary edema. Labs revealed an elevated BNP of 28,259. She was initially started on Lasix 40 mg IV every 8 hours for acute decompensated congestive heart failure.  Objective: Filed Vitals:    06/22/14 1005  BP: 133/63  Pulse: 82  Temp:   Resp:     Intake/Output Summary (Last 24 hours) at 06/22/14 1424 Last data filed at 06/22/14 1018  Gross per 24 hour  Intake    720 ml  Output   2740 ml  Net  -2020 ml   Filed Weights   06/21/14 0534 06/21/14 0651 06/22/14 0648  Weight: 58.316 kg (128 lb 9 oz) 59.4 kg (130 lb 15.3 oz) 56.63 kg (124 lb 13.5 oz)    Exam:   General:  Patient reporting feeling a little better, we ablated down the hallway and back   Cardiovascular: Regular rate and rhythm normal S1-S2   Respiratory: Having diminished breath sounds bilaterally, scattered expiratory wheezing, positive rhonchi requiring supplemental oxygen   Abdomen: Soft nontender nondistended   Musculoskeletal: No edema   Data Reviewed: Basic Metabolic Panel:  Recent Labs Lab 06/21/14 0225 06/21/14 0522 06/22/14 0318  NA 137  --  139  K 5.2  --  4.7  CL 106  --  102  CO2 21  --  21  GLUCOSE 101*  --  302*  BUN 30*  --  33*  CREATININE 1.22* 1.10 1.20*  CALCIUM 8.8  --  8.4   Liver Function Tests:  Recent Labs Lab 06/21/14 0225 06/22/14 0318  AST 23 17  ALT 24 18  ALKPHOS 129* 111  BILITOT 0.3 0.3  PROT 6.3 5.4*  ALBUMIN 3.3* 2.6*   No results for input(s): LIPASE, AMYLASE in the last 168 hours. No results for input(s): AMMONIA in the last 168 hours. CBC:  Recent Labs Lab 06/21/14 0225 06/21/14 0522 06/22/14 0318  WBC 6.2 5.6 3.0*  NEUTROABS 3.8  --  2.5  HGB 12.7 12.8  12.6  HCT 41.8 40.8 40.0  MCV 92.1 93.6 92.4  PLT 240 249 241   Cardiac Enzymes:  Recent Labs Lab 06/21/14 0225 06/21/14 0522 06/21/14 1149 06/21/14 1809  TROPONINI <0.30 <0.30 <0.30 <0.30   BNP (last 3 results)  Recent Labs  06/21/14 0229  PROBNP 28259.0*   CBG:  Recent Labs Lab 06/21/14 0657 06/21/14 1208 06/21/14 1705  GLUCAP 120* 158* 207*    No results found for this or any previous visit (from the past 240 hour(s)).   Studies: Dg Chest 2  View  06/21/2014   CLINICAL DATA:  RIGHT lower chest pain, shortness of breath and fatigue.  EXAM: CHEST  2 VIEW  COMPARISON:  Chest radiograph June 02, 2014  FINDINGS: The cardiac silhouette appears moderate to severely enlarged, similar. Heavily calcified aortic knob. Similar interstitial prominence. Small pleural effusions. Patchy bibasilar airspace opacities, slightly decreased. No pneumothorax.  Soft tissue planes and included osseous structures are nonsuspicious. Mild degenerative change of thoracic spine. Linear calcification within the RIGHT neck are likely vascular.  IMPRESSION: Stable cardiomegaly with interstitial prominence most consistent with pulmonary edema. Improved aeration of the lung bases with small pleural effusions.   Electronically Signed   By: Awilda Metro   On: 06/21/2014 03:09    Scheduled Meds: . budesonide-formoterol  2 puff Inhalation BID  . carvedilol  12.5 mg Oral BID WC  . doxycycline (VIBRAMYCIN) IV  100 mg Intravenous Q12H  . furosemide  40 mg Intravenous Q8H  . gabapentin  300 mg Oral TID  . heparin  5,000 Units Subcutaneous 3 times per day  . ipratropium-albuterol  3 mL Nebulization TID  . lisinopril  5 mg Oral Daily  . methylPREDNISolone (SOLU-MEDROL) injection  125 mg Intravenous Q12H  . mirtazapine  30 mg Oral QHS  . potassium chloride  10 mEq Oral Daily  . rosuvastatin  40 mg Oral Daily  . senna-docusate  1 tablet Oral BID  . sodium chloride  3 mL Intravenous Q12H  . sodium chloride  3 mL Intravenous Q12H   Continuous Infusions:   Active Problems:   CHF (congestive heart failure)   Acute respiratory failure   CKD (chronic kidney disease) stage 3, GFR 30-59 ml/min    Time spent: 35 min    Jeralyn Bennett  [facility] Pager [phone]. If 7PM-7AM, please contact night-coverage at [email or web address], password [facility] 06/22/2014, 2:24 PM  LOS: 1 day

## 2014-06-22 NOTE — Plan of Care (Signed)
Problem: Phase I Progression Outcomes Goal: Hemodynamically stable Outcome: Completed/Met Date Met:  06/22/14     

## 2014-06-22 NOTE — Care Management Note (Addendum)
    Page 1 of 2   06/23/2014     2:58:27 PM CARE MANAGEMENT NOTE 06/23/2014  Patient:  [name] [name],[name] [name]   Account Number:  [id number]  Date Initiated:  06/22/2014  Documentation initiated by:  Aquil Duhe  Subjective/Objective Assessment:   Pt adm on 06/21/14 with COPD exacerbation.  PTA, pt independent, lives with boyfriend.  Pt is on chronic oxygen, supplied by [facility].     Action/Plan:   Will cont to follow for dc needs as pt progresses.  PT eval pending.   Anticipated DC Date:  06/23/2014   Anticipated DC Plan:  HOME W HOME HEALTH SERVICES      DC Planning Services  CM consult      [facility] Choice  HOME HEALTH   Choice offered to / List presented to:  C-1 Patient        HH arranged  HH-1 RN  HH-10 DISEASE MANAGEMENT  HH-2 PT      HH agency  [facility]   Status of service:  Completed, signed off Medicare Important Message given?  NA - LOS <3 / Initial given by admissions (If response is "NO", the following Medicare IM given date fields will be blank) Date Medicare IM given:   Medicare IM given by:   Date Additional Medicare IM given:   Additional Medicare IM given by:    Discharge Disposition:  HOME W HOME HEALTH SERVICES  Per UR Regulation:  Reviewed for med. necessity/level of care/duration of stay  If discussed at Long Length of Stay Meetings, dates discussed:    Comments:  06/23/14 Sidney Ace, RN, BSN [phone] Referral made to Heart Failure Navigator, for possible HF clinic follow up.  Education provided.  Pt to follow up at the Heart Failure Clinic on 12/16 at 10:45am.  06/23/14 Sidney Ace, RN, BSN [phone] Pt for dc today; needs HH follow up.  Pt states she has used [facility] for [facility], and wishes to use again.  Referral to [facility], per pt choice; start of care 24-48h post dc date.  Pt denies any DME needs...states has RW at home.

## 2014-06-23 DIAGNOSIS — I5023 Acute on chronic systolic (congestive) heart failure: Secondary | ICD-10-CM | POA: Insufficient documentation

## 2014-06-23 DIAGNOSIS — J9601 Acute respiratory failure with hypoxia: Secondary | ICD-10-CM

## 2014-06-23 DIAGNOSIS — J441 Chronic obstructive pulmonary disease with (acute) exacerbation: Secondary | ICD-10-CM | POA: Insufficient documentation

## 2014-06-23 LAB — COMPREHENSIVE METABOLIC PANEL
ALT: 17 U/L (ref 0–35)
ANION GAP: 14 (ref 5–15)
AST: 17 U/L (ref 0–37)
Albumin: 2.6 g/dL — ABNORMAL LOW (ref 3.5–5.2)
Alkaline Phosphatase: 107 U/L (ref 39–117)
BUN: 39 mg/dL — ABNORMAL HIGH (ref 6–23)
CALCIUM: 8.5 mg/dL (ref 8.4–10.5)
CO2: 23 mEq/L (ref 19–32)
CREATININE: 1.28 mg/dL — AB (ref 0.50–1.10)
Chloride: 103 mEq/L (ref 96–112)
GFR calc non Af Amer: 41 mL/min — ABNORMAL LOW (ref >90)
GFR, EST AFRICAN AMERICAN: 48 mL/min — AB (ref >90)
GLUCOSE: 73 mg/dL (ref 70–99)
Potassium: 4.3 mEq/L (ref 3.7–5.3)
Sodium: 140 mEq/L (ref 137–147)
Total Bilirubin: 0.3 mg/dL (ref 0.3–1.2)
Total Protein: 5.3 g/dL — ABNORMAL LOW (ref 6.0–8.3)

## 2014-06-23 LAB — CBC WITH DIFFERENTIAL/PLATELET
Basophils Absolute: 0 10*3/uL (ref 0.0–0.1)
Basophils Relative: 0 % (ref 0–1)
EOS ABS: 0 10*3/uL (ref 0.0–0.7)
Eosinophils Relative: 0 % (ref 0–5)
HCT: 42 % (ref 36.0–46.0)
Hemoglobin: 13.5 g/dL (ref 12.0–15.0)
LYMPHS ABS: 1 10*3/uL (ref 0.7–4.0)
LYMPHS PCT: 11 % — AB (ref 12–46)
MCH: 29.2 pg (ref 26.0–34.0)
MCHC: 32.1 g/dL (ref 30.0–36.0)
MCV: 90.7 fL (ref 78.0–100.0)
Monocytes Absolute: 1.1 10*3/uL — ABNORMAL HIGH (ref 0.1–1.0)
Monocytes Relative: 12 % (ref 3–12)
NEUTROS PCT: 77 % (ref 43–77)
Neutro Abs: 6.9 10*3/uL (ref 1.7–7.7)
PLATELETS: 251 10*3/uL (ref 150–400)
RBC: 4.63 MIL/uL (ref 3.87–5.11)
RDW: 17.2 % — ABNORMAL HIGH (ref 11.5–15.5)
WBC: 9 10*3/uL (ref 4.0–10.5)

## 2014-06-23 LAB — HEMOGLOBIN A1C
HEMOGLOBIN A1C: 6.9 % — AB (ref <5.7)
MEAN PLASMA GLUCOSE: 151 mg/dL — AB (ref <117)

## 2014-06-23 LAB — GLUCOSE, CAPILLARY
GLUCOSE-CAPILLARY: 95 mg/dL (ref 70–99)
Glucose-Capillary: 166 mg/dL — ABNORMAL HIGH (ref 70–99)

## 2014-06-23 MED ORDER — DOXYCYCLINE HYCLATE 100 MG PO TABS: 100.0000 mg | ORAL_TABLET | Freq: Two times a day (BID) | ORAL | Status: DC

## 2014-06-23 MED ORDER — FUROSEMIDE 40 MG PO TABS: 40.0000 mg | ORAL_TABLET | Freq: Every day | ORAL | Status: DC

## 2014-06-23 MED ORDER — IPRATROPIUM-ALBUTEROL 0.5-2.5 (3) MG/3ML IN SOLN: 3.0000 mL | RESPIRATORY_TRACT | Status: DC | PRN

## 2014-06-23 NOTE — Progress Notes (Signed)
Heart Failure Navigator Consult Note  Presentation: [name] [name] is a 68 y.o. year old female with significant past medical history of chronic systolic heart failure with EF 20%, type 2 diabetes, COPD presenting with acute respiratory failure. Patient usually followed to the [facility]. In review the chart, patient has recurrent history of medical noncompliance. States that she's had increased work of breathing over an unspecified amount of time. Noted last admission in review of the [place] chart 06/01/2014. Admitted for similar symptoms. Reports increased work of breathing, wheezing, cough. Mild right shoulder pain. No nausea or vomiting. States that she has been compliant with her medication regimen. Does admit to high salt intake. Still smoking. States that she's not going to the [facility] system anymore because they don't take care of her. Present to the ER Tmax 97.9, heart rate in the 30s to 70s, respirations in the tens to 20s, blood pressure in the 120s. Satting greater than 97% on room air. White blood cell count 6.2, hemoglobin 12.7, creatinine 1.2. ProBNP 24,000. Troponin negative 1. EKG shows sinus rhythm with supraventricular bigeminy. Chest x-ray shows stable cardiomegaly with interstitial prominence most consistent with pulmonary edema. Small bilateral pleural effusions.   Past Medical History  Diagnosis Date  . Diabetes mellitus without complication   . COPD (chronic obstructive pulmonary disease)   . Arthritis     legs, shoulders  . Immune deficiency disorder   . Hypertension   . Renal disorder   . CHF (congestive heart failure)   . Shortness of breath dyspnea     History   Social History  . Marital Status: Married    Spouse Name: N/A    Number of Children: N/A  . Years of Education: N/A   Social History Main Topics  . Smoking status: Current Every Day Smoker -- 0.50 packs/day    Types: Cigarettes  . Smokeless tobacco: Never Used  . Alcohol Use:  No  . Drug Use: Yes    Special: Marijuana  . Sexual Activity: None   Other Topics Concern  . None   Social History Narrative    ECHO:Study Conclusions--06/21/14  - Left ventricle: The cavity size was mildly dilated. Systolic function was severely reduced. The estimated ejection fraction was in the range of 20% to 25%. Diffuse hypokinesis. Doppler parameters are consistent with abnormal left ventricular relaxation (grade 1 diastolic dysfunction). - Aortic valve: There was mild regurgitation. - Mitral valve: There was mild regurgitation. - Left atrium: The atrium was mildly dilated. - Right ventricle: The cavity size was mildly dilated. Wall thickness was normal. Systolic function was moderately reduced. - Right atrium: The atrium was moderately dilated. - Tricuspid valve: There was moderate regurgitation. - Pulmonary arteries: Systolic pressure was severely increased. PA peak pressure: 85 mm Hg (S). - Pericardium, extracardiac: A trivial pericardial effusion was identified.  Transthoracic echocardiography. M-mode, complete 2D, spectral Doppler, and color Doppler. Birthdate: Patient birthdate: [birth year]. Age: Patient is 68 yr old. Sex: Gender: female. BMI: 23.2 kg/m^2. Blood pressure:   114/70 Patient status: Inpatient. Study date: Study date: 06/21/2014. Study time: 03:00 PM. Location: Echo laboratory.  BNP    Component Value Date/Time   PROBNP 28259.0* 06/21/2014 0229    Education Assessment and Provision:  Detailed education and instructions provided on heart failure disease management including the following:  Signs and symptoms of Heart Failure When to call the physician Importance of daily weights Low sodium diet Fluid restriction Medication management Anticipated future follow-up appointments  Patient education  given on each of the above topics.  Patient acknowledges understanding and acceptance of all instructions.  I briefly  discussed above topics with Ms. Simeone as she was ready to be discharged and quite ready to leave.  I explained the importance of daily weights and did provide them with a scale for home use.  She denies issues with getting medications.  [place] in [place] her boyfriend and says that transportation will also be no problem.  She claims that she eats a low sodium diet --however her female friend present in the room says that she eats canned soup often.  I reinforced a low sodium diet and high sodium foods to avoid.  Education Materials:  "Living Better With Heart Failure" Booklet, Daily Weight Tracker Tool and Heart Failure Educational Video.   High Risk Criteria for Readmission and/or Poor Patient Outcomes:  (Recommend Follow-up with [facility])--Yes   EF <30%-20-25%  2 or more admissions in 6 months- yes  Difficult social situation- No  Demonstrates medication noncompliance- No?    Barriers of Care:  Knowledge and compliance  Discharge Planning:  Plans to discharge to home with boyfriend.  She has an outpatient follow-up appt with the [facility] on Dec 16 at 10:45 am.

## 2014-06-23 NOTE — Plan of Care (Signed)
Problem: Phase I Progression Outcomes Goal: O2 sats > or equal 90% or at baseline Outcome: Completed/Met Date Met:  06/23/14 Goal: Dyspnea controlled at rest Outcome: Completed/Met Date Met:  06/23/14 Goal: Pain controlled Outcome: Completed/Met Date Met:  06/23/14 Goal: Progress activity as tolerated unless otherwise ordered Outcome: Completed/Met Date Met:  06/23/14 Goal: Discharge plan established Outcome: Completed/Met Date Met:  06/23/14

## 2014-06-23 NOTE — Progress Notes (Signed)
Discharge instructions dicussed with patient. Discussed diet, follow up appointment, activity, and home medications. Prescriptions for Doxycycline, Lasix and nebulizer solution given to patient. S/S of heart failure discussed with patient. Contacted Raynelle Fanning with case management in regards to referral to heart failure clinic. Raynelle Fanning following up. Will follow up.

## 2014-06-23 NOTE — Discharge Summary (Signed)
Physician Discharge Summary  [name] [name] ZOX:[id number] DOB: [birth year] DOA: 06/21/2014  PCP: No primary care provider on file.  Admit date: 06/21/2014 Discharge date: 06/23/2014  Time spent: 35 minutes  Recommendations for Outpatient Follow-up:  1. Please follow up volume status, she was admitted for acute CHF 2. Prior to discharge Home Health Services for home PT and RN  Discharge Diagnoses:  Active Problems:   CHF (congestive heart failure)   Acute respiratory failure   CKD (chronic kidney disease) stage 3, GFR 30-59 ml/min   Acute on chronic systolic heart failure   COPD exacerbation   Discharge Condition: Stable/Improved  Diet recommendation: Heart Healthy Diet  Filed Weights   06/21/14 0651 06/22/14 0648 06/23/14 0529  Weight: 59.4 kg (130 lb 15.3 oz) 56.63 kg (124 lb 13.5 oz) 55.5 kg (122 lb 5.7 oz)    History of present illness:  History of Present Illness:This is a 68 y.o. year old female with significant past medical history of chronic systolic heart failure with EF 20%, type 2 diabetes, COPD presenting with acute respiratory failure. Patient usually followed to the [facility] medical system. In review the chart, patient has recurrent history of medical noncompliance. States that she's had increased work of breathing over an unspecified amount of time. Noted last admission in review of the [place] chart 06/01/2014. Admitted for similar symptoms. Reports increased work of breathing, wheezing, cough. Mild right shoulder pain. No nausea or vomiting. States that she has been compliant with her medication regimen. Does admit to high salt intake. Still smoking. States that she's not going to the [facility] system anymore because they don't take care of her. Present to the ER Tmax 97.9, heart rate in the 30s to 70s, respirations in the tens to 20s, blood pressure in the 120s. Satting greater than 97% on room air. White blood cell count 6.2, hemoglobin 12.7, creatinine 1.2.  ProBNP 24,000. Troponin negative 1. EKG shows sinus rhythm with supraventricular bigeminy. Chest x-ray shows stable cardiomegaly with interstitial prominence most consistent with pulmonary edema. Small bilateral pleural effusions.  Hospital Course:  Patient is a pleasant 68 year old female with a past medical history of systolic congestive heart failure, having transthoracic echocardiogram performed on 06/21/2014 showing ejection fraction of 20-25% with diffuse hypokinesis, chronic obstructive pulmonary disease, admitted to the medicine service on 06/21/2014 presenting with complaints of shortness of breath. Initial chest x-ray revealed stable cardiomegaly with interstitial prominence most consistent with pulmonary edema. Labs revealed an elevated BNP of 28,259. She was initially started on Lasix 40 mg IV every 8 hours for acute decompensated congestive heart failure, then transitioned to oral lasix after adequate diuresis. By 06/23/2014 she has a negative neg fluid balance of 5.1 liters. Given clinical improvement she was discharge to her home in stable condition. Case Manager was consulted to set her up with Home Health Services for home PT and RN.   Procedures:  2D Echo performed on 06/21/2014 showing LVEF of 20-25%   Discharge Exam: Filed Vitals:   06/23/14 1010  BP: 108/69  Pulse: 71  Temp: 97.7 F (36.5 C)  Resp: 18    General: Patient reporting feeling a little better, we ablated down the hallway and back   Cardiovascular: Regular rate and rhythm normal S1-S2   Respiratory: Having diminished breath sounds bilaterally, though overall improved lung exam from yesterday   Abdomen: Soft nontender nondistended   Musculoskeletal: No edema  Discharge Instructions You were cared for by a hospitalist during your hospital stay. If you  have any questions about your discharge medications or the care you received while you were in the hospital after you are discharged, you can call the  unit and asked to speak with the hospitalist on call if the hospitalist that took care of you is not available. Once you are discharged, your primary care physician will handle any further medical issues. Please note that NO REFILLS for any discharge medications will be authorized once you are discharged, as it is imperative that you return to your primary care physician (or establish a relationship with a primary care physician if you do not have one) for your aftercare needs so that they can reassess your need for medications and monitor your lab values.  Discharge Instructions    (HEART FAILURE PATIENTS) Call MD:  Anytime you have any of the following symptoms: 1) 3 pound weight gain in 24 hours or 5 pounds in 1 week 2) shortness of breath, with or without a dry hacking cough 3) swelling in the hands, feet or stomach 4) if you have to sleep on extra pillows at night in order to breathe.    Complete by:  As directed      Call MD for:  difficulty breathing, headache or visual disturbances    Complete by:  As directed      Call MD for:  extreme fatigue    Complete by:  As directed      Call MD for:  hives    Complete by:  As directed      Call MD for:  persistant dizziness or light-headedness    Complete by:  As directed      Call MD for:  persistant nausea and vomiting    Complete by:  As directed      Call MD for:  redness, tenderness, or signs of infection (pain, swelling, redness, odor or green/yellow discharge around incision site)    Complete by:  As directed      Call MD for:  severe uncontrolled pain    Complete by:  As directed      Call MD for:  temperature >100.4    Complete by:  As directed      Diet - low sodium heart healthy    Complete by:  As directed      Diet - low sodium heart healthy    Complete by:  As directed      Discharge instructions    Complete by:  As directed   Please follow up with your Family Physician in 1 week     Increase activity slowly    Complete by:   As directed      Increase activity slowly    Complete by:  As directed           Current Discharge Medication List    START taking these medications   Details  doxycycline (VIBRA-TABS) 100 MG tablet Take 1 tablet (100 mg total) by mouth every 12 (twelve) hours. Qty: 6 tablet, Refills: 0    ipratropium-albuterol (DUONEB) 0.5-2.5 (3) MG/3ML SOLN Take 3 mLs by nebulization every 4 (four) hours as needed. Qty: 360 mL, Refills: 3      CONTINUE these medications which have CHANGED   Details  furosemide (LASIX) 40 MG tablet Take 1 tablet (40 mg total) by mouth daily. Qty: 30 tablet, Refills: 2      CONTINUE these medications which have NOT CHANGED   Details  carvedilol (COREG) 12.5 MG tablet Take 12.5 mg  by mouth 2 (two) times daily with a meal.    lisinopril (PRINIVIL,ZESTRIL) 5 MG tablet Take 5 mg by mouth daily.    mirtazapine (REMERON) 30 MG tablet Take 30 mg by mouth at bedtime.    potassium chloride (K-DUR,KLOR-CON) 10 MEQ tablet Take 10 mEq by mouth 2 (two) times daily.    predniSONE (DELTASONE) 20 MG tablet Take 20 mg by mouth daily with breakfast.    rosuvastatin (CRESTOR) 40 MG tablet Take 40 mg by mouth daily.    acetaminophen (TYLENOL) 325 MG tablet Take 2 tablets (650 mg total) by mouth every 6 (six) hours as needed for mild pain (or Fever >/= 101). Qty: 60 tablet, Refills: 2    albuterol (PROVENTIL HFA;VENTOLIN HFA) 108 (90 BASE) MCG/ACT inhaler Inhale 2 puffs into the lungs 4 (four) times daily as needed for wheezing or shortness of breath.    bisacodyl (DULCOLAX) 5 MG EC tablet Take 5 mg by mouth daily as needed (constipation).    budesonide-formoterol (SYMBICORT) 80-4.5 MCG/ACT inhaler Inhale 2 puffs into the lungs 2 (two) times daily.    ergocalciferol (VITAMIN D2) 50000 UNITS capsule Take 1 capsule (50,000 Units total) by mouth once a week. Qty: 8 capsule, Refills: 12    gabapentin (NEURONTIN) 300 MG capsule Take 300 mg by mouth 3 (three) times daily.     senna-docusate (SENOKOT-S) 8.6-50 MG per tablet Take 1 tablet by mouth 2 (two) times daily. Qty: 60 tablet, Refills: 2      STOP taking these medications     diphenhydramine-acetaminophen (TYLENOL PM) 25-500 MG TABS        No Known Allergies    The results of significant diagnostics from this hospitalization (including imaging, microbiology, ancillary and laboratory) are listed below for reference.    Significant Diagnostic Studies: Dg Chest 2 View  06/21/2014   CLINICAL DATA:  RIGHT lower chest pain, shortness of breath and fatigue.  EXAM: CHEST  2 VIEW  COMPARISON:  Chest radiograph June 02, 2014  FINDINGS: The cardiac silhouette appears moderate to severely enlarged, similar. Heavily calcified aortic knob. Similar interstitial prominence. Small pleural effusions. Patchy bibasilar airspace opacities, slightly decreased. No pneumothorax.  Soft tissue planes and included osseous structures are nonsuspicious. Mild degenerative change of thoracic spine. Linear calcification within the RIGHT neck are likely vascular.  IMPRESSION: Stable cardiomegaly with interstitial prominence most consistent with pulmonary edema. Improved aeration of the lung bases with small pleural effusions.   Electronically Signed   By: Awilda Metro   On: 06/21/2014 03:09    Microbiology: No results found for this or any previous visit (from the past 240 hour(s)).   Labs: Basic Metabolic Panel:  Recent Labs Lab 06/21/14 0225 06/21/14 0522 06/22/14 0318 06/23/14 0633  NA 137  --  139 140  K 5.2  --  4.7 4.3  CL 106  --  102 103  CO2 21  --  21 23  GLUCOSE 101*  --  302* 73  BUN 30*  --  33* 39*  CREATININE 1.22* 1.10 1.20* 1.28*  CALCIUM 8.8  --  8.4 8.5   Liver Function Tests:  Recent Labs Lab 06/21/14 0225 06/22/14 0318 06/23/14 0633  AST 23 17 17   ALT 24 18 17   ALKPHOS 129* 111 107  BILITOT 0.3 0.3 0.3  PROT 6.3 5.4* 5.3*  ALBUMIN 3.3* 2.6* 2.6*   No results for input(s):  LIPASE, AMYLASE in the last 168 hours. No results for input(s): AMMONIA in the last 168  hours. CBC:  Recent Labs Lab 06/21/14 0225 06/21/14 0522 06/22/14 0318 06/23/14 0633  WBC 6.2 5.6 3.0* 9.0  NEUTROABS 3.8  --  2.5 6.9  HGB 12.7 12.8 12.6 13.5  HCT 41.8 40.8 40.0 42.0  MCV 92.1 93.6 92.4 90.7  PLT 240 249 241 251   Cardiac Enzymes:  Recent Labs Lab 06/21/14 0225 06/21/14 0522 06/21/14 1149 06/21/14 1809  TROPONINI <0.30 <0.30 <0.30 <0.30   BNP: BNP (last 3 results)  Recent Labs  06/21/14 0229  PROBNP 28259.0*   CBG:  Recent Labs Lab 06/21/14 1705 06/22/14 1610 06/22/14 2114 06/23/14 0547 06/23/14 1058  GLUCAP 207* 329* 255* 95 166*       Signed:  Javia Dillow  [facility] 06/23/2014, 12:27 PM

## 2014-06-23 NOTE — Progress Notes (Signed)
Asked patient what PCP name was. Patient states MD is out of [facility], however cannot think of his name at this time. Patient states she just started seeing new PCP. Explained to patient that new follow up appointment needed to be scheduled, patient states she was suppose to see PCP on 06/22/2014, however daughter cancelled appointment since she was hospitalized. Patient does state that daughter rescheduled appt.

## 2014-06-23 NOTE — Progress Notes (Signed)
Cardiac monitor discontinued. CCMD notified.  

## 2014-06-23 NOTE — Progress Notes (Signed)
Daphne with Heart Failure clinic has seen patient in regards in Clinic Referral. Patient's son here to take patient home. Patient taken out of hospital via wheelchair for discharge.

## 2014-06-29 ENCOUNTER — Encounter (HOSPITAL_COMMUNITY): Payer: PRIVATE HEALTH INSURANCE

## 2014-07-30 ENCOUNTER — Inpatient Hospital Stay (HOSPITAL_COMMUNITY)
Admission: EM | Admit: 2014-07-30 | Discharge: 2014-08-05 | DRG: 291 | Disposition: A | Discharge status: Home / Self Care | Payer: Medicare Other | Attending: Internal Medicine | Admitting: Internal Medicine

## 2014-07-30 ENCOUNTER — Emergency Department (HOSPITAL_COMMUNITY): Payer: Medicare Other

## 2014-07-30 ENCOUNTER — Encounter (HOSPITAL_COMMUNITY): Payer: Self-pay | Admitting: Emergency Medicine

## 2014-07-30 DIAGNOSIS — I129 Hypertensive chronic kidney disease with stage 1 through stage 4 chronic kidney disease, or unspecified chronic kidney disease: Secondary | ICD-10-CM | POA: Diagnosis present

## 2014-07-30 DIAGNOSIS — R0989 Other specified symptoms and signs involving the circulatory and respiratory systems: Secondary | ICD-10-CM | POA: Diagnosis not present

## 2014-07-30 DIAGNOSIS — J42 Unspecified chronic bronchitis: Secondary | ICD-10-CM

## 2014-07-30 DIAGNOSIS — Z681 Body mass index (BMI) 19 or less, adult: Secondary | ICD-10-CM | POA: Diagnosis not present

## 2014-07-30 DIAGNOSIS — R0602 Shortness of breath: Secondary | ICD-10-CM | POA: Diagnosis present

## 2014-07-30 DIAGNOSIS — R531 Weakness: Secondary | ICD-10-CM

## 2014-07-30 DIAGNOSIS — I5023 Acute on chronic systolic (congestive) heart failure: Secondary | ICD-10-CM | POA: Diagnosis not present

## 2014-07-30 DIAGNOSIS — E876 Hypokalemia: Secondary | ICD-10-CM | POA: Diagnosis present

## 2014-07-30 DIAGNOSIS — E780 Pure hypercholesterolemia: Secondary | ICD-10-CM | POA: Diagnosis present

## 2014-07-30 DIAGNOSIS — J440 Chronic obstructive pulmonary disease with acute lower respiratory infection: Secondary | ICD-10-CM | POA: Diagnosis present

## 2014-07-30 DIAGNOSIS — N183 Chronic kidney disease, stage 3 unspecified: Secondary | ICD-10-CM | POA: Diagnosis present

## 2014-07-30 DIAGNOSIS — Z720 Tobacco use: Secondary | ICD-10-CM | POA: Diagnosis not present

## 2014-07-30 DIAGNOSIS — Z9119 Patient's noncompliance with other medical treatment and regimen: Secondary | ICD-10-CM | POA: Diagnosis present

## 2014-07-30 DIAGNOSIS — J209 Acute bronchitis, unspecified: Secondary | ICD-10-CM | POA: Diagnosis present

## 2014-07-30 DIAGNOSIS — I10 Essential (primary) hypertension: Secondary | ICD-10-CM | POA: Diagnosis not present

## 2014-07-30 DIAGNOSIS — E119 Type 2 diabetes mellitus without complications: Secondary | ICD-10-CM | POA: Diagnosis present

## 2014-07-30 DIAGNOSIS — F1290 Cannabis use, unspecified, uncomplicated: Secondary | ICD-10-CM | POA: Diagnosis not present

## 2014-07-30 DIAGNOSIS — Z794 Long term (current) use of insulin: Secondary | ICD-10-CM

## 2014-07-30 DIAGNOSIS — Z9114 Patient's other noncompliance with medication regimen: Secondary | ICD-10-CM | POA: Diagnosis not present

## 2014-07-30 DIAGNOSIS — E43 Unspecified severe protein-calorie malnutrition: Secondary | ICD-10-CM | POA: Diagnosis not present

## 2014-07-30 DIAGNOSIS — M1990 Unspecified osteoarthritis, unspecified site: Secondary | ICD-10-CM | POA: Diagnosis not present

## 2014-07-30 DIAGNOSIS — Z7189 Other specified counseling: Secondary | ICD-10-CM

## 2014-07-30 DIAGNOSIS — J9621 Acute and chronic respiratory failure with hypoxia: Secondary | ICD-10-CM | POA: Diagnosis not present

## 2014-07-30 DIAGNOSIS — I517 Cardiomegaly: Secondary | ICD-10-CM | POA: Diagnosis not present

## 2014-07-30 DIAGNOSIS — J410 Simple chronic bronchitis: Secondary | ICD-10-CM | POA: Diagnosis not present

## 2014-07-30 DIAGNOSIS — Z66 Do not resuscitate: Secondary | ICD-10-CM | POA: Diagnosis present

## 2014-07-30 DIAGNOSIS — J439 Emphysema, unspecified: Secondary | ICD-10-CM | POA: Diagnosis not present

## 2014-07-30 DIAGNOSIS — Z515 Encounter for palliative care: Secondary | ICD-10-CM

## 2014-07-30 DIAGNOSIS — D849 Immunodeficiency, unspecified: Secondary | ICD-10-CM | POA: Diagnosis present

## 2014-07-30 DIAGNOSIS — I509 Heart failure, unspecified: Secondary | ICD-10-CM | POA: Insufficient documentation

## 2014-07-30 DIAGNOSIS — F17210 Nicotine dependence, cigarettes, uncomplicated: Secondary | ICD-10-CM | POA: Diagnosis present

## 2014-07-30 DIAGNOSIS — R069 Unspecified abnormalities of breathing: Secondary | ICD-10-CM | POA: Diagnosis not present

## 2014-07-30 DIAGNOSIS — R0600 Dyspnea, unspecified: Secondary | ICD-10-CM | POA: Diagnosis not present

## 2014-07-30 DIAGNOSIS — J849 Interstitial pulmonary disease, unspecified: Secondary | ICD-10-CM | POA: Diagnosis not present

## 2014-07-30 DIAGNOSIS — Z9981 Dependence on supplemental oxygen: Secondary | ICD-10-CM | POA: Diagnosis not present

## 2014-07-30 DIAGNOSIS — J449 Chronic obstructive pulmonary disease, unspecified: Secondary | ICD-10-CM | POA: Diagnosis present

## 2014-07-30 DIAGNOSIS — J984 Other disorders of lung: Secondary | ICD-10-CM | POA: Diagnosis not present

## 2014-07-30 DIAGNOSIS — R0603 Acute respiratory distress: Secondary | ICD-10-CM

## 2014-07-30 HISTORY — DX: Chronic systolic (congestive) heart failure: I50.22

## 2014-07-30 HISTORY — DX: Chronic kidney disease, stage 3 unspecified: N18.30

## 2014-07-30 HISTORY — DX: Pneumonia, unspecified organism: J18.9

## 2014-07-30 HISTORY — DX: Dependence on supplemental oxygen: Z99.81

## 2014-07-30 HISTORY — DX: Type 2 diabetes mellitus without complications: E11.9

## 2014-07-30 HISTORY — DX: Chronic kidney disease, stage 3 (moderate): N18.3

## 2014-07-30 HISTORY — DX: Pure hypercholesterolemia, unspecified: E78.00

## 2014-07-30 LAB — BLOOD GAS, ARTERIAL
ACID-BASE EXCESS: 2.4 mmol/L — AB (ref 0.0–2.0)
Bicarbonate: 26.8 mEq/L — ABNORMAL HIGH (ref 20.0–24.0)
DRAWN BY: 24610
O2 Content: 4 L/min
O2 SAT: 89.5 %
PH ART: 7.397 (ref 7.350–7.450)
PO2 ART: 62.1 mmHg — AB (ref 80.0–100.0)
Patient temperature: 98.6
TCO2: 28.2 mmol/L (ref 0–100)
pCO2 arterial: 44.6 mmHg (ref 35.0–45.0)

## 2014-07-30 LAB — CBC WITH DIFFERENTIAL/PLATELET
BASOS ABS: 0 10*3/uL (ref 0.0–0.1)
BASOS PCT: 0 % (ref 0–1)
EOS PCT: 0 % (ref 0–5)
Eosinophils Absolute: 0 10*3/uL (ref 0.0–0.7)
HCT: 40 % (ref 36.0–46.0)
Hemoglobin: 12 g/dL (ref 12.0–15.0)
Lymphocytes Relative: 5 % — ABNORMAL LOW (ref 12–46)
Lymphs Abs: 0.4 10*3/uL — ABNORMAL LOW (ref 0.7–4.0)
MCH: 27.8 pg (ref 26.0–34.0)
MCHC: 30 g/dL (ref 30.0–36.0)
MCV: 92.8 fL (ref 78.0–100.0)
MONO ABS: 0.3 10*3/uL (ref 0.1–1.0)
Monocytes Relative: 4 % (ref 3–12)
NEUTROS PCT: 91 % — AB (ref 43–77)
Neutro Abs: 8.2 10*3/uL — ABNORMAL HIGH (ref 1.7–7.7)
PLATELETS: 179 10*3/uL (ref 150–400)
RBC: 4.31 MIL/uL (ref 3.87–5.11)
RDW: 19.6 % — ABNORMAL HIGH (ref 11.5–15.5)
WBC: 8.9 10*3/uL (ref 4.0–10.5)

## 2014-07-30 LAB — CBC
HEMATOCRIT: 40.5 % (ref 36.0–46.0)
HEMOGLOBIN: 12.2 g/dL (ref 12.0–15.0)
MCH: 28 pg (ref 26.0–34.0)
MCHC: 30.1 g/dL (ref 30.0–36.0)
MCV: 93.1 fL (ref 78.0–100.0)
Platelets: 184 10*3/uL (ref 150–400)
RBC: 4.35 MIL/uL (ref 3.87–5.11)
RDW: 19.5 % — ABNORMAL HIGH (ref 11.5–15.5)
WBC: 7.7 10*3/uL (ref 4.0–10.5)

## 2014-07-30 LAB — I-STAT TROPONIN, ED: TROPONIN I, POC: 0.03 ng/mL (ref 0.00–0.08)

## 2014-07-30 LAB — BASIC METABOLIC PANEL
ANION GAP: 11 (ref 5–15)
BUN: 32 mg/dL — ABNORMAL HIGH (ref 6–23)
CO2: 26 mmol/L (ref 19–32)
Calcium: 8.5 mg/dL (ref 8.4–10.5)
Chloride: 105 mEq/L (ref 96–112)
Creatinine, Ser: 1.42 mg/dL — ABNORMAL HIGH (ref 0.50–1.10)
GFR calc Af Amer: 42 mL/min — ABNORMAL LOW (ref >90)
GFR calc non Af Amer: 36 mL/min — ABNORMAL LOW (ref >90)
GLUCOSE: 158 mg/dL — AB (ref 70–99)
POTASSIUM: 3.8 mmol/L (ref 3.5–5.1)
SODIUM: 142 mmol/L (ref 135–145)

## 2014-07-30 LAB — COMPREHENSIVE METABOLIC PANEL
ALBUMIN: 3.3 g/dL — AB (ref 3.5–5.2)
ALK PHOS: 191 U/L — AB (ref 39–117)
ALT: 28 U/L (ref 0–35)
AST: 53 U/L — AB (ref 0–37)
Anion gap: 8 (ref 5–15)
BUN: 30 mg/dL — ABNORMAL HIGH (ref 6–23)
CHLORIDE: 105 meq/L (ref 96–112)
CO2: 28 mmol/L (ref 19–32)
Calcium: 8.7 mg/dL (ref 8.4–10.5)
Creatinine, Ser: 1.35 mg/dL — ABNORMAL HIGH (ref 0.50–1.10)
GFR calc Af Amer: 45 mL/min — ABNORMAL LOW (ref >90)
GFR calc non Af Amer: 39 mL/min — ABNORMAL LOW (ref >90)
Glucose, Bld: 177 mg/dL — ABNORMAL HIGH (ref 70–99)
Potassium: 3.4 mmol/L — ABNORMAL LOW (ref 3.5–5.1)
SODIUM: 141 mmol/L (ref 135–145)
Total Bilirubin: 0.6 mg/dL (ref 0.3–1.2)
Total Protein: 6.6 g/dL (ref 6.0–8.3)

## 2014-07-30 LAB — I-STAT ARTERIAL BLOOD GAS, ED
ACID-BASE DEFICIT: 2 mmol/L (ref 0.0–2.0)
Bicarbonate: 23.1 mEq/L (ref 20.0–24.0)
O2 SAT: 79 %
PCO2 ART: 40.9 mmHg (ref 35.0–45.0)
TCO2: 24 mmol/L (ref 0–100)
pH, Arterial: 7.36 (ref 7.350–7.450)
pO2, Arterial: 45 mmHg — ABNORMAL LOW (ref 80.0–100.0)

## 2014-07-30 LAB — TSH: TSH: 0.209 u[IU]/mL — ABNORMAL LOW (ref 0.350–4.500)

## 2014-07-30 LAB — GLUCOSE, CAPILLARY
GLUCOSE-CAPILLARY: 181 mg/dL — AB (ref 70–99)
GLUCOSE-CAPILLARY: 246 mg/dL — AB (ref 70–99)
Glucose-Capillary: 274 mg/dL — ABNORMAL HIGH (ref 70–99)
Glucose-Capillary: 276 mg/dL — ABNORMAL HIGH (ref 70–99)

## 2014-07-30 LAB — TROPONIN I
TROPONIN I: 0.03 ng/mL (ref <0.031)
Troponin I: 0.03 ng/mL (ref <0.031)
Troponin I: 0.03 ng/mL (ref <0.031)

## 2014-07-30 LAB — BRAIN NATRIURETIC PEPTIDE

## 2014-07-30 MED ORDER — CETYLPYRIDINIUM CHLORIDE 0.05 % MT LIQD
7.0000 mL | Freq: Two times a day (BID) | OROMUCOSAL | Status: DC
  Administered 2014-07-30 – 2014-08-05 (×11): 7 mL via OROMUCOSAL

## 2014-07-30 MED ORDER — INSULIN ASPART 100 UNIT/ML ~~LOC~~ SOLN
0.0000 [IU] | Freq: Three times a day (TID) | SUBCUTANEOUS | Status: DC
  Administered 2014-07-30: 3 [IU] via SUBCUTANEOUS
  Administered 2014-07-30: 5 [IU] via SUBCUTANEOUS
  Administered 2014-07-30: 2 [IU] via SUBCUTANEOUS
  Administered 2014-07-31: 1 [IU] via SUBCUTANEOUS
  Administered 2014-07-31: 3 [IU] via SUBCUTANEOUS
  Administered 2014-08-01: 2 [IU] via SUBCUTANEOUS
  Administered 2014-08-01: 5 [IU] via SUBCUTANEOUS
  Administered 2014-08-02: 3 [IU] via SUBCUTANEOUS
  Administered 2014-08-02: 1 [IU] via SUBCUTANEOUS
  Administered 2014-08-03 (×2): 5 [IU] via SUBCUTANEOUS
  Administered 2014-08-04: 3 [IU] via SUBCUTANEOUS
  Administered 2014-08-04: 5 [IU] via SUBCUTANEOUS
  Administered 2014-08-05: 1 [IU] via SUBCUTANEOUS

## 2014-07-30 MED ORDER — ALBUTEROL SULFATE (2.5 MG/3ML) 0.083% IN NEBU: 3.0000 mL | INHALATION_SOLUTION | Freq: Four times a day (QID) | RESPIRATORY_TRACT | Status: DC | PRN

## 2014-07-30 MED ORDER — IPRATROPIUM-ALBUTEROL 0.5-2.5 (3) MG/3ML IN SOLN
3.0000 mL | RESPIRATORY_TRACT | Status: DC
  Administered 2014-07-30 – 2014-07-31 (×4): 3 mL via RESPIRATORY_TRACT
  Filled 2014-07-30 (×4): qty 3

## 2014-07-30 MED ORDER — FUROSEMIDE 10 MG/ML IJ SOLN: 40.0000 mg | INTRAMUSCULAR | Status: DC

## 2014-07-30 MED ORDER — BISACODYL 5 MG PO TBEC: 5.0000 mg | DELAYED_RELEASE_TABLET | Freq: Every day | ORAL | Status: DC | PRN

## 2014-07-30 MED ORDER — BUDESONIDE 0.25 MG/2ML IN SUSP
0.2500 mg | Freq: Two times a day (BID) | RESPIRATORY_TRACT | Status: DC
  Administered 2014-07-30 – 2014-08-05 (×13): 0.25 mg via RESPIRATORY_TRACT
  Filled 2014-07-30 (×16): qty 2

## 2014-07-30 MED ORDER — ONDANSETRON HCL 4 MG/2ML IJ SOLN: 4.0000 mg | Freq: Four times a day (QID) | INTRAMUSCULAR | Status: DC | PRN

## 2014-07-30 MED ORDER — ENOXAPARIN SODIUM 40 MG/0.4ML ~~LOC~~ SOLN: 40.0000 mg | SUBCUTANEOUS | Status: DC

## 2014-07-30 MED ORDER — POTASSIUM CHLORIDE CRYS ER 20 MEQ PO TBCR
20.0000 meq | EXTENDED_RELEASE_TABLET | Freq: Two times a day (BID) | ORAL | Status: DC
  Administered 2014-07-30 – 2014-08-05 (×13): 20 meq via ORAL
  Filled 2014-07-30 (×15): qty 1

## 2014-07-30 MED ORDER — FUROSEMIDE 10 MG/ML IJ SOLN
60.0000 mg | INTRAMUSCULAR | Status: AC
  Administered 2014-07-30: 60 mg via INTRAVENOUS
  Filled 2014-07-30: qty 6

## 2014-07-30 MED ORDER — SENNOSIDES-DOCUSATE SODIUM 8.6-50 MG PO TABS
1.0000 | ORAL_TABLET | Freq: Two times a day (BID) | ORAL | Status: DC
  Administered 2014-07-30 – 2014-08-04 (×8): 1 via ORAL
  Filled 2014-07-30 (×14): qty 1

## 2014-07-30 MED ORDER — LISINOPRIL 5 MG PO TABS
5.0000 mg | ORAL_TABLET | Freq: Every day | ORAL | Status: DC
  Administered 2014-07-30 – 2014-08-05 (×6): 5 mg via ORAL
  Filled 2014-07-30 (×7): qty 1

## 2014-07-30 MED ORDER — ONDANSETRON HCL 4 MG/2ML IJ SOLN: 4.0000 mg | Freq: Three times a day (TID) | INTRAMUSCULAR | Status: AC | PRN

## 2014-07-30 MED ORDER — FUROSEMIDE 10 MG/ML IJ SOLN
40.0000 mg | Freq: Two times a day (BID) | INTRAMUSCULAR | Status: DC
  Administered 2014-07-30 – 2014-07-31 (×3): 40 mg via INTRAVENOUS
  Filled 2014-07-30 (×5): qty 4

## 2014-07-30 MED ORDER — PREDNISONE 20 MG PO TABS
20.0000 mg | ORAL_TABLET | Freq: Every day | ORAL | Status: DC
  Administered 2014-07-30 – 2014-08-05 (×7): 20 mg via ORAL
  Filled 2014-07-30 (×8): qty 1

## 2014-07-30 MED ORDER — ALBUTEROL SULFATE (2.5 MG/3ML) 0.083% IN NEBU: 2.5000 mg | INHALATION_SOLUTION | RESPIRATORY_TRACT | Status: DC | PRN

## 2014-07-30 MED ORDER — MIRTAZAPINE 30 MG PO TABS
30.0000 mg | ORAL_TABLET | Freq: Every day | ORAL | Status: DC
  Administered 2014-07-30 – 2014-08-04 (×6): 30 mg via ORAL
  Filled 2014-07-30 (×7): qty 1

## 2014-07-30 MED ORDER — GABAPENTIN 300 MG PO CAPS
300.0000 mg | ORAL_CAPSULE | Freq: Three times a day (TID) | ORAL | Status: DC
  Administered 2014-07-30 – 2014-08-05 (×19): 300 mg via ORAL
  Filled 2014-07-30 (×21): qty 1

## 2014-07-30 MED ORDER — ENOXAPARIN SODIUM 40 MG/0.4ML ~~LOC~~ SOLN
40.0000 mg | SUBCUTANEOUS | Status: DC
Start: 2014-07-30 — End: 2014-08-05
  Administered 2014-07-30 – 2014-08-05 (×7): 40 mg via SUBCUTANEOUS
  Filled 2014-07-30 (×7): qty 0.4

## 2014-07-30 MED ORDER — ACETAMINOPHEN 650 MG RE SUPP: 650.0000 mg | Freq: Four times a day (QID) | RECTAL | Status: DC | PRN

## 2014-07-30 MED ORDER — ERGOCALCIFEROL 1.25 MG (50000 UT) PO CAPS
50000.0000 [IU] | ORAL_CAPSULE | ORAL | Status: DC
  Administered 2014-07-30: 50000 [IU] via ORAL
  Filled 2014-07-30: qty 1

## 2014-07-30 MED ORDER — POTASSIUM CHLORIDE CRYS ER 10 MEQ PO TBCR
10.0000 meq | EXTENDED_RELEASE_TABLET | Freq: Two times a day (BID) | ORAL | Status: DC
  Filled 2014-07-30 (×2): qty 1

## 2014-07-30 MED ORDER — DOXYCYCLINE HYCLATE 100 MG IV SOLR
100.0000 mg | Freq: Two times a day (BID) | INTRAVENOUS | Status: DC
  Administered 2014-07-30 – 2014-08-01 (×5): 100 mg via INTRAVENOUS
  Filled 2014-07-30 (×6): qty 100

## 2014-07-30 MED ORDER — CARVEDILOL 12.5 MG PO TABS
12.5000 mg | ORAL_TABLET | Freq: Two times a day (BID) | ORAL | Status: DC
  Administered 2014-07-30 – 2014-08-03 (×8): 12.5 mg via ORAL
  Filled 2014-07-30 (×11): qty 1

## 2014-07-30 MED ORDER — ONDANSETRON HCL 4 MG PO TABS: 4.0000 mg | ORAL_TABLET | Freq: Four times a day (QID) | ORAL | Status: DC | PRN

## 2014-07-30 MED ORDER — SODIUM CHLORIDE 0.9 % IJ SOLN: 3.0000 mL | Freq: Two times a day (BID) | INTRAMUSCULAR | Status: DC

## 2014-07-30 MED ORDER — PREDNISONE 20 MG PO TABS
20.0000 mg | ORAL_TABLET | Freq: Every day | ORAL | Status: DC
  Filled 2014-07-30 (×2): qty 1

## 2014-07-30 MED ORDER — ROSUVASTATIN CALCIUM 40 MG PO TABS
40.0000 mg | ORAL_TABLET | Freq: Every day | ORAL | Status: DC
  Administered 2014-07-30 – 2014-08-05 (×7): 40 mg via ORAL
  Filled 2014-07-30 (×7): qty 1

## 2014-07-30 MED ORDER — SODIUM CHLORIDE 0.9 % IJ SOLN
3.0000 mL | Freq: Two times a day (BID) | INTRAMUSCULAR | Status: DC
  Administered 2014-07-30 – 2014-08-05 (×12): 3 mL via INTRAVENOUS

## 2014-07-30 MED ORDER — ACETAMINOPHEN 325 MG PO TABS: 650.0000 mg | ORAL_TABLET | Freq: Four times a day (QID) | ORAL | Status: DC | PRN

## 2014-07-30 MED ORDER — CARVEDILOL 12.5 MG PO TABS
12.5000 mg | ORAL_TABLET | Freq: Two times a day (BID) | ORAL | Status: DC
  Filled 2014-07-30 (×3): qty 1

## 2014-07-30 NOTE — H&P (Signed)
[facility] History and Physical  Sherrilynn Roselle HAL:[id number] DOB: 04-26-1946 DOA: 07/30/2014  Referring physician: ER physician. PCP: No primary care provider on file.   Chief Complaint: Shortness of breath.  HPI: [name] [name] is a 69 y.o. female history of chronic systolic heart failure last year measured in December 2015 was 20%, COPD, ongoing tobacco abuse, chronic kidney disease stage III, diabetes mellitus not on medication presents to the ER because of shortness of breath. Patient stated that over the last 2-3 days patient has been increasingly short of breath and as per ER physician patient had ran out of her oxygen at home. In the ER patient was found to be short of breath and wheezing. Chest x-ray shows congestion. BNP is more than 4000. Patient was given Lasix 60 mg IV in the ER. Patient states she has been compliant with her medications. Patient will be admitted for decompensated CHF. Patient denies any chest pain. Chills abdominal pain nausea vomiting or diarrhea.   Review of Systems: As presented in the history of presenting illness, rest negative.  Past Medical History  Diagnosis Date  . Diabetes mellitus without complication   . COPD (chronic obstructive pulmonary disease)   . Arthritis     legs, shoulders  . Immune deficiency disorder   . Hypertension   . Renal disorder   . CHF (congestive heart failure)   . Shortness of breath dyspnea    Past Surgical History  Procedure Laterality Date  . Excision of a boil     Social History:  reports that she has been smoking Cigarettes.  She has been smoking about 0.50 packs per day. She has never used smokeless tobacco. She reports that she uses illicit drugs (Marijuana). She reports that she does not drink alcohol. Where does patient live home. Can patient participate in ADLs? Yes.  No Known Allergies  Family History:  Family History  Problem Relation Age of Onset  . Other Mother     Per patient died from old  age.  . Other Father     Per patient died from old age.      Prior to Admission medications   Medication Sig Start Date End Date Taking? Authorizing Provider  acetaminophen (TYLENOL) 325 MG tablet Take 2 tablets (650 mg total) by mouth every 6 (six) hours as needed for mild pain (or Fever >/= 101). Patient not taking: Reported on 06/21/2014 01/11/14   Richarda Overlie, MD  albuterol (PROVENTIL HFA;VENTOLIN HFA) 108 (90 BASE) MCG/ACT inhaler Inhale 2 puffs into the lungs 4 (four) times daily as needed for wheezing or shortness of breath.    Historical Provider, MD  bisacodyl (DULCOLAX) 5 MG EC tablet Take 5 mg by mouth daily as needed (constipation).    Historical Provider, MD  budesonide-formoterol (SYMBICORT) 80-4.5 MCG/ACT inhaler Inhale 2 puffs into the lungs 2 (two) times daily.    Historical Provider, MD  carvedilol (COREG) 12.5 MG tablet Take 12.5 mg by mouth 2 (two) times daily with a meal.    Historical Provider, MD  doxycycline (VIBRA-TABS) 100 MG tablet Take 1 tablet (100 mg total) by mouth every 12 (twelve) hours. 06/23/14   Jeralyn Bennett, MD  ergocalciferol (VITAMIN D2) 50000 UNITS capsule Take 1 capsule (50,000 Units total) by mouth once a week. Patient not taking: Reported on 06/21/2014 01/11/14   Richarda Overlie, MD  furosemide (LASIX) 40 MG tablet Take 1 tablet (40 mg total) by mouth daily. 06/23/14   Jeralyn Bennett, MD  gabapentin (NEURONTIN) 300 MG  capsule Take 300 mg by mouth 3 (three) times daily.    Historical Provider, MD  ipratropium-albuterol (DUONEB) 0.5-2.5 (3) MG/3ML SOLN Take 3 mLs by nebulization every 4 (four) hours as needed. 06/23/14   Jeralyn Bennett, MD  lisinopril (PRINIVIL,ZESTRIL) 5 MG tablet Take 5 mg by mouth daily.    Historical Provider, MD  mirtazapine (REMERON) 30 MG tablet Take 30 mg by mouth at bedtime.    Historical Provider, MD  potassium chloride (K-DUR,KLOR-CON) 10 MEQ tablet Take 10 mEq by mouth 2 (two) times daily.    Historical Provider, MD   predniSONE (DELTASONE) 20 MG tablet Take 20 mg by mouth daily with breakfast.    Historical Provider, MD  rosuvastatin (CRESTOR) 40 MG tablet Take 40 mg by mouth daily.    Historical Provider, MD  senna-docusate (SENOKOT-S) 8.6-50 MG per tablet Take 1 tablet by mouth 2 (two) times daily. Patient not taking: Reported on 06/21/2014 01/11/14   Richarda Overlie, MD    Physical Exam: Filed Vitals:   07/30/14 0216 07/30/14 0230 07/30/14 0300 07/30/14 0315  BP: 122/67 108/68 132/84 140/87  Pulse: 85 84 85 94  Temp:      TempSrc:      Resp: SpO2: 83% 87% 99% 90%     General:  Poorly built and nourished.  Eyes: Anicteric no pallor.  ENT: No discharge from the ears eyes nose and mouth.  Neck: JVD elevated no mass felt.  Cardiovascular: S1 and S2 heard.  Respiratory: Mild expiratory wheeze or no crepitations.  Abdomen: Soft nontender bowel sounds present.  Skin: No rash.  Musculoskeletal: Bilateral lower extremity edema.  Psychiatric: Appears normal.  Neurologic: Alert awake oriented to time place and person. Moves all extremities.  Labs on Admission:  Basic Metabolic Panel:  Recent Labs Lab 07/30/14 0129  NA 142  K 3.8  CL 105  CO2 26  GLUCOSE 158*  BUN 32*  CREATININE 1.42*  CALCIUM 8.5   Liver Function Tests: No results for input(s): AST, ALT, ALKPHOS, BILITOT, PROT, ALBUMIN in the last 168 hours. No results for input(s): LIPASE, AMYLASE in the last 168 hours. No results for input(s): AMMONIA in the last 168 hours. CBC:  Recent Labs Lab 07/30/14 0129  WBC 7.7  HGB 12.2  HCT 40.5  MCV 93.1  PLT 184   Cardiac Enzymes: No results for input(s): CKTOTAL, CKMB, CKMBINDEX, TROPONINI in the last 168 hours.  BNP (last 3 results)  Recent Labs  06/21/14 0229  PROBNP 28259.0*   CBG: No results for input(s): GLUCAP in the last 168 hours.  Radiological Exams on Admission: Dg Chest Portable 1 View  07/30/2014   CLINICAL DATA:  Acute onset of  shortness of breath, cough and weakness. Initial encounter.  EXAM: PORTABLE CHEST - 1 VIEW  COMPARISON:  Chest radiograph performed 06/21/2014  FINDINGS: The lungs are well-aerated. Mild bibasilar airspace opacities raise concern for mild interstitial edema, though pneumonia could have a similar appearance. Vascular congestion is noted. No definite pleural effusion or pneumothorax is seen.  The cardiomediastinal silhouette is mildly enlarged. No acute osseous abnormalities are seen.  IMPRESSION: Vascular congestion and mild cardiomegaly. Mild bibasilar airspace opacities raise concern for mild interstitial edema, though pneumonia could have a similar appearance.   Electronically Signed   By: Roanna Raider M.D.   On: 07/30/2014 02:13    EKG: Independently reviewed. Normal sinus rhythm with LVH.  Assessment/Plan Active Problems:   COPD (chronic obstructive pulmonary disease)  Hypertension   Tobacco abuse   CHF (congestive heart failure)   CKD (chronic kidney disease) stage 3, GFR 30-59 ml/min   Acute on chronic systolic heart failure   1. Acute respiratory failure secondary to decompensated systolic heart failure last EF measured was 20% - patient has received Lasix 60 mg IV in the ER and I have placed patient on 40 mg IV every 12 hourly. Closely follow daily weights and intake output and metabolic panel. I think patient also has a complement of bronchitis for which I have placed patient on doxycycline and nebulizer. Patient is on chronic prednisone therapy. 2. COPD - see #1. 3. Chronic disease stage III - creatinine appears to be at baseline. Follow metabolic panel. 4. Diabetes mellitus type 2 is on no medications - patient has been placed on sliding scale coverage. 5. Ongoing tobacco abuse - patient advised to quit smoking. 6. Hypertension - continue present medications.   DVT Prophylaxis Lovenox.  Code Status: Full code.  Family Communication: None.  Disposition Plan: Admit to inpatient.     Devanta Daniel N. [facility] Pager [phone].  If 7PM-7AM, please contact night-coverage [email or web address] Password [email or web address] 07/30/2014, 3:38 AM

## 2014-07-30 NOTE — Progress Notes (Signed)
[facility] PROGRESS NOTE  [name] [name] ZOX:[id number] DOB: 12/13/1945 DOA: 07/30/2014 PCP: No primary care provider on file.  Brief narrative 69 year old female with chronic severe systolic CHF, COPD, ongoing tobacco use, chronic kidney disease stage III, diabetes mellitus not on medications presented with progressive shortness of breath for the past 2 days. In the ED patient was found to be short of breath and wheezy. Chest x-ray showing congestion with BNP >4000. Patient given 60 mg IV Lasix in the ED and admitted to the hospitalist service.  Reportedly patient not compliant with her medications, is not aware who cc follows as outpatient or seeing a cardiologist.  Assessment/Plan: Acute respiratory failure with decompensated systolic CHF Last EF one month back of 20-25 % with diffuse hypokinesis -Continue IV Lasix 40 mg every 12 are. Monitor strict I/O and daily weight. Continue Coreg and lisinopril. -She also has some underlying bronchitis contributing to it. -Patient seems to be noncompliant with medications and outpatient follow-up. Will need to establish care with heart failure clinic and [facility].  COPD Has mild acute bronchitis. Continue when necessary nebs and doxycycline  Chronic kidney disease stage III Renal function at baseline. Monitor with diuresis  Type 2 diabetes mellitus Monitor on sliding scale insulin  Tobacco abuse Counseled on smoking cessation  Hypokalemia Replenished    HTN Continue home medications  Protein calorie malnutrition Nutrition consult  DVT prophylaxis: Subcutaneous heparin Diet: Heart healthy   Code Status: Full code Family Communication: None at bedside Disposition Plan: Home Once clinically improved.   Consultants:  None  Procedures:  None  Antibiotics:  Doxycycline  HPI/Subjective: Since seen and examined this morning. She was very sleepy and not able to provide much history. She was seen later during the day and  reported to the nurse that she tripped and fell on the floor and landed on the right hip. On exam she does not have any acute injury, no hip tenderness and normal range of movement. It is unclear if she truly had a fall. Patient is not able to tell if she takes her medications regularly or sees anyone as outpatient.  Objective: Filed Vitals:   07/30/14 1423  BP: 100/59  Pulse: 70  Temp: 97.8 F (36.6 C)  Resp: 20    Intake/Output Summary (Last 24 hours) at 07/30/14 1452 Last data filed at 07/30/14 1423  Gross per 24 hour  Intake    610 ml  Output   2075 ml  Net  -1465 ml   Filed Weights   07/30/14 0454  Weight: 51.347 kg (113 lb 3.2 oz)    Exam:   General:  Elderly thin built female in no acute distress  HEENT: No pallor, moist oral mucosa, no JVD, Supple neck,  Cardiovascular: Normal S1 and S2, systolic murmur 3/6  Respiratory: Basilar crackles, no rhonchi or wheeze  Gastrointestinal , Nondistended, nontender, bowel sounds present  Musculoskeletal: On, trace edema bilaterally  Next CNS: Alert and oriented  Data Reviewed: Basic Metabolic Panel:  Recent Labs Lab 07/30/14 0129 07/30/14 0544  NA 142 141  K 3.8 3.4*  CL 105 105  CO2 26 28  GLUCOSE 158* 177*  BUN 32* 30*  CREATININE 1.42* 1.35*  CALCIUM 8.5 8.7   Liver Function Tests:  Recent Labs Lab 07/30/14 0544  AST 53*  ALT 28  ALKPHOS 191*  BILITOT 0.6  PROT 6.6  ALBUMIN 3.3*   No results for input(s): LIPASE, AMYLASE in the last 168 hours. No results for input(s): AMMONIA in the  last 168 hours. CBC:  Recent Labs Lab 07/30/14 0129 07/30/14 0544  WBC 7.7 8.9  NEUTROABS  --  8.2*  HGB 12.2 12.0  HCT 40.5 40.0  MCV 93.1 92.8  PLT 184 179   Cardiac Enzymes:  Recent Labs Lab 07/30/14 0544 07/30/14 1300  TROPONINI 0.03 0.03   BNP (last 3 results)  Recent Labs  06/21/14 0229  PROBNP 28259.0*   CBG:  Recent Labs Lab 07/30/14 0622 07/30/14 1123  GLUCAP 181* 274*    No  results found for this or any previous visit (from the past 240 hour(s)).   Studies: Dg Chest Portable 1 View  07/30/2014   CLINICAL DATA:  Acute onset of shortness of breath, cough and weakness. Initial encounter.  EXAM: PORTABLE CHEST - 1 VIEW  COMPARISON:  Chest radiograph performed 06/21/2014  FINDINGS: The lungs are well-aerated. Mild bibasilar airspace opacities raise concern for mild interstitial edema, though pneumonia could have a similar appearance. Vascular congestion is noted. No definite pleural effusion or pneumothorax is seen.  The cardiomediastinal silhouette is mildly enlarged. No acute osseous abnormalities are seen.  IMPRESSION: Vascular congestion and mild cardiomegaly. Mild bibasilar airspace opacities raise concern for mild interstitial edema, though pneumonia could have a similar appearance.   Electronically Signed   By: Roanna Raider M.D.   On: 07/30/2014 02:13    Scheduled Meds: . antiseptic oral rinse  7 mL Mouth Rinse BID  . budesonide (PULMICORT) nebulizer solution  0.25 mg Nebulization BID  . carvedilol  12.5 mg Oral BID WC  . doxycycline (VIBRAMYCIN) IV  100 mg Intravenous Q12H  . enoxaparin (LOVENOX) injection  40 mg Subcutaneous Q24H  . ergocalciferol  50,000 Units Oral Weekly  . furosemide  40 mg Intravenous Q12H  . gabapentin  300 mg Oral TID  . insulin aspart  0-9 Units Subcutaneous TID WC  . ipratropium-albuterol  3 mL Nebulization Q4H  . lisinopril  5 mg Oral Daily  . mirtazapine  30 mg Oral QHS  . potassium chloride  20 mEq Oral BID  . predniSONE  20 mg Oral Q breakfast  . rosuvastatin  40 mg Oral Daily  . senna-docusate  1 tablet Oral BID  . sodium chloride  3 mL Intravenous Q12H   Continuous Infusions:     Time spent: 25 minutes    Murice Barbar  [facility] Pager [phone] If 7PM-7AM, please contact night-coverage at [email or web address], password [facility] 07/30/2014, 2:52 PM  LOS: 0 days

## 2014-07-30 NOTE — ED Provider Notes (Signed)
Patient seen/examined in the Emergency Department in conjunction with Midlevel Provider  Patient reports shortness of breath Exam : awake/alert, coarse BS noted bilaterally Plan: will need admission, pt agreeable   Joya Gaskins, MD 07/30/14 [phone]

## 2014-07-30 NOTE — Progress Notes (Signed)
   07/30/14 1400  What Happened  Was fall witnessed? No  Was patient injured? No  Patient found on floor  Found by Staff-comment  Stated prior activity to/from bed, chair, or stretcher  Follow Up  MD notified Dr. Gonzella Lex  Time MD notified [phone]  Family notified Yes-comment  Time family notified 6  Progress note created (see row info) Yes  Vitals  Temp 97.5 F (36.4 C)  Temp Source Oral  BP Location Right Arm  BP Method Automatic  Patient Position (if appropriate) Sitting  Pulse Rate (!) 58  Oxygen Therapy  SpO2 95 %  O2 Device Nasal Cannula  O2 Flow Rate (L/min) 4 L/min   Was the fall witnessed: no  Patient condition before and after the fall: Pt was assisted to the [facility] by NT.  NT stepped out of the room to obtain clean linen for pt's bed.  NT heard pt calling out and entered room to find pt on the floor beside the bedside commode.  Pt was assessed for injuries, VS were obtained, MD was notified and pt was assisted back to bed.   Patient's reaction to the fall: "Im fine."  Pt complained of left shoulder and right buttock soreness to RN. MD aware.   Name of the doctor that was notified including date and time: Dr. Gonzella Lex on 07/30/14 @ 1425   Any interventions and vital signs:  100/59 on R arm

## 2014-07-30 NOTE — ED Notes (Addendum)
Pt arrives from home via EMS with c/o [facility] and not feeling well for the last couple of days, two duonebs en route. Sats 85% on EMS arrival. Solumedrol PTA.  Pt was on albuterol tx p/t ems arrival. Wheezing bilaterally with sats at 97%. HTN, ran out of BP meds today.

## 2014-07-30 NOTE — ED Provider Notes (Signed)
CSN: [id number]     Arrival date & time 07/30/14  0102 History   First MD Initiated Contact with Patient 07/30/14 0114     Chief Complaint  Patient presents with  . Shortness of Breath     (Consider location/radiation/quality/duration/timing/severity/associated sxs/prior Treatment) HPI Comments: Patient is a 69 year old female with a past medical history of diabetes, COPD, hypertension, CHF, and CKD who presents with shortness of breath. Symptoms started gradually and progressively worsened since the onset. Patient reports "not feeling well" for the past 3 days but became acutely short of breath today. Patient reports running out of her home oxygen. She normally wears 2L nasal cannula. No aggravating symptoms. Exertion makes the symptoms worse. Patient denies chest pain, fever, or cough. She reports associated leg swelling.    Past Medical History  Diagnosis Date  . Diabetes mellitus without complication   . COPD (chronic obstructive pulmonary disease)   . Arthritis     legs, shoulders  . Immune deficiency disorder   . Hypertension   . Renal disorder   . CHF (congestive heart failure)   . Shortness of breath dyspnea    Past Surgical History  Procedure Laterality Date  . Excision of a boil     Family History  Problem Relation Age of Onset  . Other Mother     Per patient died from old age.  . Other Father     Per patient died from old age.   History  Substance Use Topics  . Smoking status: Current Every Day Smoker -- 0.50 packs/day    Types: Cigarettes  . Smokeless tobacco: Never Used  . Alcohol Use: No   OB History    No data available     Review of Systems  Constitutional: Negative for fever, chills and fatigue.  HENT: Negative for trouble swallowing.   Eyes: Negative for visual disturbance.  Respiratory: Positive for shortness of breath and wheezing.   Cardiovascular: Negative for chest pain and palpitations.  Gastrointestinal: Negative for nausea, vomiting,  abdominal pain and diarrhea.  Genitourinary: Negative for dysuria and difficulty urinating.  Musculoskeletal: Negative for arthralgias and neck pain.  Skin: Negative for color change.  Neurological: Negative for dizziness and weakness.  Psychiatric/Behavioral: Negative for dysphoric mood.      Allergies  Review of patient's allergies indicates no known allergies.  Home Medications   Prior to Admission medications   Medication Sig Start Date End Date Taking? Authorizing Provider  acetaminophen (TYLENOL) 325 MG tablet Take 2 tablets (650 mg total) by mouth every 6 (six) hours as needed for mild pain (or Fever >/= 101). Patient not taking: Reported on 06/21/2014 01/11/14   Richarda Overlie, MD  albuterol (PROVENTIL HFA;VENTOLIN HFA) 108 (90 BASE) MCG/ACT inhaler Inhale 2 puffs into the lungs 4 (four) times daily as needed for wheezing or shortness of breath.    Historical Provider, MD  bisacodyl (DULCOLAX) 5 MG EC tablet Take 5 mg by mouth daily as needed (constipation).    Historical Provider, MD  budesonide-formoterol (SYMBICORT) 80-4.5 MCG/ACT inhaler Inhale 2 puffs into the lungs 2 (two) times daily.    Historical Provider, MD  carvedilol (COREG) 12.5 MG tablet Take 12.5 mg by mouth 2 (two) times daily with a meal.    Historical Provider, MD  doxycycline (VIBRA-TABS) 100 MG tablet Take 1 tablet (100 mg total) by mouth every 12 (twelve) hours. 06/23/14   Jeralyn Bennett, MD  ergocalciferol (VITAMIN D2) 50000 UNITS capsule Take 1 capsule (50,000 Units total)  by mouth once a week. Patient not taking: Reported on 06/21/2014 01/11/14   Richarda Overlie, MD  furosemide (LASIX) 40 MG tablet Take 1 tablet (40 mg total) by mouth daily. 06/23/14   Jeralyn Bennett, MD  gabapentin (NEURONTIN) 300 MG capsule Take 300 mg by mouth 3 (three) times daily.    Historical Provider, MD  ipratropium-albuterol (DUONEB) 0.5-2.5 (3) MG/3ML SOLN Take 3 mLs by nebulization every 4 (four) hours as needed. 06/23/14   Jeralyn Bennett, MD  lisinopril (PRINIVIL,ZESTRIL) 5 MG tablet Take 5 mg by mouth daily.    Historical Provider, MD  mirtazapine (REMERON) 30 MG tablet Take 30 mg by mouth at bedtime.    Historical Provider, MD  potassium chloride (K-DUR,KLOR-CON) 10 MEQ tablet Take 10 mEq by mouth 2 (two) times daily.    Historical Provider, MD  predniSONE (DELTASONE) 20 MG tablet Take 20 mg by mouth daily with breakfast.    Historical Provider, MD  rosuvastatin (CRESTOR) 40 MG tablet Take 40 mg by mouth daily.    Historical Provider, MD  senna-docusate (SENOKOT-S) 8.6-50 MG per tablet Take 1 tablet by mouth 2 (two) times daily. Patient not taking: Reported on 06/21/2014 01/11/14   Richarda Overlie, MD   BP 129/81 mmHg  Pulse 98  Temp(Src) 98.6 F (37 C) (Oral)  Resp 25  SpO2 93% Physical Exam  Constitutional: She is oriented to person, place, and time. She appears well-developed and well-nourished. No distress.  HENT:  Head: Normocephalic and atraumatic.  Eyes: Conjunctivae and EOM are normal.  Neck: Normal range of motion.  Cardiovascular: Normal rate, regular rhythm and intact distal pulses.  Exam reveals no gallop and no friction rub.   No murmur heard. Bilateral lower extremity 2+ pitting edema  Pulmonary/Chest: Effort normal. She has wheezes. She has no rales. She exhibits no tenderness.  In creased breathing effort. Bibasilar crackles. Inspiratory and expiratory wheezes noted in upper lung fields bilaterally.   Abdominal: Soft. She exhibits no distension. There is no tenderness. There is no rebound.  Musculoskeletal: Normal range of motion.  Neurological: She is alert and oriented to person, place, and time. Coordination normal.  Speech is goal-oriented. Moves limbs without ataxia.   Skin: Skin is warm and dry.  Psychiatric: She has a normal mood and affect. Her behavior is normal.  Nursing note and vitals reviewed.   ED Course  Procedures (including critical care time) Labs Review Labs Reviewed   BASIC METABOLIC PANEL - Abnormal; Notable for the following:    Glucose, Bld 158 (*)    BUN 32 (*)    Creatinine, Ser 1.42 (*)    GFR calc non Af Amer 36 (*)    GFR calc Af Amer 42 (*)    All other components within normal limits  CBC - Abnormal; Notable for the following:    RDW 19.5 (*)    All other components within normal limits  BRAIN NATRIURETIC PEPTIDE - Abnormal; Notable for the following:    B Natriuretic Peptide >4500.0 (*)    All other components within normal limits  BLOOD GAS, ARTERIAL  I-STAT TROPOININ, ED   CRITICAL CARE Performed by: Emilia Beck   Total critical care time: 1 hour  Critical care time was exclusive of separately billable procedures and treating other patients.  Critical care was necessary to treat or prevent imminent or life-threatening deterioration.  Critical care was time spent personally by me on the following activities: development of treatment plan with patient and/or surrogate as well as  nursing, discussions with consultants, evaluation of patient's response to treatment, examination of patient, obtaining history from patient or surrogate, ordering and performing treatments and interventions, ordering and review of laboratory studies, ordering and review of radiographic studies, pulse oximetry and re-evaluation of patient's condition.   Imaging Review Dg Chest Portable 1 View  07/30/2014   CLINICAL DATA:  Acute onset of shortness of breath, cough and weakness. Initial encounter.  EXAM: PORTABLE CHEST - 1 VIEW  COMPARISON:  Chest radiograph performed 06/21/2014  FINDINGS: The lungs are well-aerated. Mild bibasilar airspace opacities raise concern for mild interstitial edema, though pneumonia could have a similar appearance. Vascular congestion is noted. No definite pleural effusion or pneumothorax is seen.  The cardiomediastinal silhouette is mildly enlarged. No acute osseous abnormalities are seen.  IMPRESSION: Vascular congestion and mild  cardiomegaly. Mild bibasilar airspace opacities raise concern for mild interstitial edema, though pneumonia could have a similar appearance.   Electronically Signed   By: Roanna Raider M.D.   On: 07/30/2014 02:13     EKG Interpretation   Date/Time:  Saturday July 30 2014 01:50:24 EST Ventricular Rate:  85 PR Interval:  142 QRS Duration: 84 QT Interval:  389 QTC Calculation: 463 R Axis:   11 Text Interpretation:  Sinus rhythm Multiform ventricular premature  complexes Probable left atrial enlargement Left ventricular hypertrophy  Anterior Q waves, possibly due to LVH Nonspecific T abnormalities, lateral  leads No significant change since last tracing Confirmed by Bebe Shaggy  MD,  DONALD ([id number]) on 07/30/2014 1:54:57 AM      MDM   Final diagnoses:  Acute congestive heart failure, unspecified congestive heart failure type  Respiratory distress    1:25 AM Labs and chest xray pending. Patient wearing 4L oxygen currently with oxygen saturation at 90% at rest.   2:34 AM Patient's chest xray consistent with CHF exacerbation. Patient's BNP >4500. Patient given 60 mg IV lasix. Patient will be admitted. Patient is drowsy. ABG shows no CO2 retention. Patient will be started on BiPap.   Emilia Beck, PA-C 07/30/14 0547  Joya Gaskins, MD 07/31/14 [phone]

## 2014-07-31 ENCOUNTER — Inpatient Hospital Stay (HOSPITAL_COMMUNITY): Payer: Medicare Other

## 2014-07-31 DIAGNOSIS — Z720 Tobacco use: Secondary | ICD-10-CM

## 2014-07-31 LAB — BASIC METABOLIC PANEL WITH GFR
Anion gap: 8 (ref 5–15)
BUN: 34 mg/dL — ABNORMAL HIGH (ref 6–23)
CO2: 27 mmol/L (ref 19–32)
Calcium: 8.7 mg/dL (ref 8.4–10.5)
Chloride: 106 meq/L (ref 96–112)
Creatinine, Ser: 1.32 mg/dL — ABNORMAL HIGH (ref 0.50–1.10)
GFR calc Af Amer: 46 mL/min — ABNORMAL LOW
GFR calc non Af Amer: 40 mL/min — ABNORMAL LOW
Glucose, Bld: 85 mg/dL (ref 70–99)
Potassium: 4 mmol/L (ref 3.5–5.1)
Sodium: 141 mmol/L (ref 135–145)

## 2014-07-31 LAB — GLUCOSE, CAPILLARY
GLUCOSE-CAPILLARY: 93 mg/dL (ref 70–99)
Glucose-Capillary: 144 mg/dL — ABNORMAL HIGH (ref 70–99)
Glucose-Capillary: 241 mg/dL — ABNORMAL HIGH (ref 70–99)
Glucose-Capillary: 244 mg/dL — ABNORMAL HIGH (ref 70–99)

## 2014-07-31 MED ORDER — NICOTINE 14 MG/24HR TD PT24
14.0000 mg | MEDICATED_PATCH | TRANSDERMAL | Status: DC
  Administered 2014-07-31 – 2014-08-04 (×5): 14 mg via TRANSDERMAL
  Filled 2014-07-31 (×6): qty 1

## 2014-07-31 MED ORDER — IPRATROPIUM-ALBUTEROL 0.5-2.5 (3) MG/3ML IN SOLN
3.0000 mL | Freq: Three times a day (TID) | RESPIRATORY_TRACT | Status: DC
  Administered 2014-07-31 – 2014-08-05 (×15): 3 mL via RESPIRATORY_TRACT
  Filled 2014-07-31 (×15): qty 3

## 2014-07-31 MED ORDER — FUROSEMIDE 10 MG/ML IJ SOLN
60.0000 mg | Freq: Two times a day (BID) | INTRAMUSCULAR | Status: DC
  Administered 2014-07-31 – 2014-08-01 (×2): 60 mg via INTRAVENOUS
  Filled 2014-07-31 (×4): qty 6

## 2014-07-31 NOTE — Progress Notes (Addendum)
[facility] PROGRESS NOTE  Mareli Antunes ZOX:[id number] DOB: 1945-09-24 DOA: 07/30/2014 PCP: No primary care provider on file.  Brief narrative 69 year old female with chronic severe systolic CHF, COPD, ongoing tobacco use, chronic kidney disease stage III, diabetes mellitus not on medications presented with progressive shortness of breath for the past 2 days. In the ED patient was found to be short of breath and wheezy. Chest x-ray showing congestion with BNP >4000. Patient given 60 mg IV Lasix in the ED and admitted to the hospitalist service.  Reportedly patient not compliant with her medications, is not aware who cc follows as outpatient or seeing a cardiologist.  Assessment/Plan: Acute respiratory failure with decompensated systolic CHF Last EF one month back of 20-25 % with diffuse hypokinesis - patinet requiring 5 L O2 via [place]. CXR showing mild CHF with lower lobe interstitial fibrosis. Will increase lasix to 60 mg bid. Monitor strict I/O and daily weight. Continue Coreg and lisinopril. -Patient seems to be noncompliant with medications and outpatient follow-up. Will need to establish care with heart failure clinic and [facility].  COPD Has acute bronchitis and requiring almost 5 L o2 via [place]. Continue  daily prednisone and nebs. . . Continue  doxycycline  Chronic kidney disease stage III Renal function at baseline. Monitor with diuresis  Type 2 diabetes mellitus Monitor on sliding scale insulin  Tobacco abuse Counseled on smoking cessation  Hypokalemia Replenished    HTN Continue home medications  Protein calorie malnutrition Nutrition consult  DVT prophylaxis: Subcutaneous heparin Diet: Heart healthy/ diabetic   Code Status: Full code Family Communication: None at bedside Disposition Plan: Home Once clinically improved.   Consultants:  None  Procedures:  None  Antibiotics:  Doxycycline  HPI/Subjective: Since seen and examined this morning. Has been  requiring almost 5 L via [place]. Denies any symptoms.  Objective: Filed Vitals:   07/31/14 0857  BP: 97/45  Pulse: 72  Temp: 97.8 F (36.6 C)  Resp: 20    Intake/Output Summary (Last 24 hours) at 07/31/14 1423 Last data filed at 07/31/14 1343  Gross per 24 hour  Intake   1220 ml  Output   1800 ml  Net   -580 ml   Filed Weights   07/30/14 0454 07/31/14 0509  Weight: 51.347 kg (113 lb 3.2 oz) 51.8 kg (114 lb 3.2 oz)    Exam:   General:  no acute distress  HEENT:  moist oral mucosa, no JVD, Supple neck,  Cardiovascular: Normal S1 and S2, systolic murmur 3/6  Respiratory: bibasilar crackles, no rhonchi or wheeze  Gastrointestinal , Nondistended, nontender,   Musculoskeletal:  trace edema bilaterally    Data Reviewed: Basic Metabolic Panel:  Recent Labs Lab 07/30/14 0129 07/30/14 0544 07/31/14 0400  NA 142 141 141  K 3.8 3.4* 4.0  CL 105 105 106  CO2 GLUCOSE 158* 177* 85  BUN 32* 30* 34*  CREATININE 1.42* 1.35* 1.32*  CALCIUM 8.5 8.7 8.7   Liver Function Tests:  Recent Labs Lab 07/30/14 0544  AST 53*  ALT 28  ALKPHOS 191*  BILITOT 0.6  PROT 6.6  ALBUMIN 3.3*   No results for input(s): LIPASE, AMYLASE in the last 168 hours. No results for input(s): AMMONIA in the last 168 hours. CBC:  Recent Labs Lab 07/30/14 0129 07/30/14 0544  WBC 7.7 8.9  NEUTROABS  --  8.2*  HGB 12.2 12.0  HCT 40.5 40.0  MCV 93.1 92.8  PLT 184 179   Cardiac Enzymes:  Recent Labs Lab 07/30/14 0544 07/30/14 1300 07/30/14 1850  TROPONINI 0.03 0.03 0.03   BNP (last 3 results)  Recent Labs  06/21/14 0229  PROBNP 28259.0*   CBG:  Recent Labs Lab 07/30/14 1123 07/30/14 1623 07/30/14 2101 07/31/14 0554 07/31/14 1101  GLUCAP 274* 246* 276* 93 144*    No results found for this or any previous visit (from the past 240 hour(s)).   Studies: Dg Chest 2 View  07/31/2014   CLINICAL DATA:  Chronic cough and shortness of breath that acutely  worsened over the past several days and is associated with fever. Current history of hypertension, diabetes, COPD, stage III chronic kidney disease, and acute on chronic systolic heart failure.  EXAM: CHEST  2 VIEW  COMPARISON:  Portable chest x-ray yesterday, 06/02/2014. Two-view chest x-ray 06/21/2014, 09/26/2013.  FINDINGS: Cardiac silhouette markedly enlarged, unchanged. Thoracic aorta atherosclerotic, unchanged. Hilar and mediastinal contours otherwise unremarkable. Pulmonary venous hypertension and mild diffuse interstitial pulmonary edema as evidenced by Kerley B-lines, unchanged since yesterday. Chronic interstitial prominence in the lower lobes dating back to the March, 2015 examination. No new pulmonary parenchymal abnormalities. No visible pleural effusions currently. Osseous demineralization suspected with mild degenerative changes involving the thoracic spine.  IMPRESSION: 1. 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 2. No new abnormalities.   Electronically Signed   By: Hulan Saas M.D.   On: 07/31/2014 11:28   Dg Chest Portable 1 View  07/30/2014   CLINICAL DATA:  Acute onset of shortness of breath, cough and weakness. Initial encounter.  EXAM: PORTABLE CHEST - 1 VIEW  COMPARISON:  Chest radiograph performed 06/21/2014  FINDINGS: The lungs are well-aerated. Mild bibasilar airspace opacities raise concern for mild interstitial edema, though pneumonia could have a similar appearance. Vascular congestion is noted. No definite pleural effusion or pneumothorax is seen.  The cardiomediastinal silhouette is mildly enlarged. No acute osseous abnormalities are seen.  IMPRESSION: Vascular congestion and mild cardiomegaly. Mild bibasilar airspace opacities raise concern for mild interstitial edema, though pneumonia could have a similar appearance.   Electronically Signed   By: Roanna Raider M.D.   On: 07/30/2014 02:13    Scheduled Meds: .  antiseptic oral rinse  7 mL Mouth Rinse BID  . budesonide (PULMICORT) nebulizer solution  0.25 mg Nebulization BID  . carvedilol  12.5 mg Oral BID WC  . doxycycline (VIBRAMYCIN) IV  100 mg Intravenous Q12H  . enoxaparin (LOVENOX) injection  40 mg Subcutaneous Q24H  . ergocalciferol  50,000 Units Oral Weekly  . furosemide  60 mg Intravenous Q12H  . gabapentin  300 mg Oral TID  . insulin aspart  0-9 Units Subcutaneous TID WC  . ipratropium-albuterol  3 mL Nebulization TID  . lisinopril  5 mg Oral Daily  . mirtazapine  30 mg Oral QHS  . potassium chloride  20 mEq Oral BID  . predniSONE  20 mg Oral Q breakfast  . rosuvastatin  40 mg Oral Daily  . senna-docusate  1 tablet Oral BID  . sodium chloride  3 mL Intravenous Q12H   Continuous Infusions:     Time spent: 25 minutes    Jackalyn Haith  [facility] Pager [phone] If 7PM-7AM, please contact night-coverage at [email or web address], password [facility] 07/31/2014, 2:23 PM  LOS: 1 day

## 2014-07-31 NOTE — Progress Notes (Signed)
Utilization review completed.  

## 2014-08-01 ENCOUNTER — Encounter (HOSPITAL_COMMUNITY): Payer: Self-pay | Admitting: General Practice

## 2014-08-01 DIAGNOSIS — J9621 Acute and chronic respiratory failure with hypoxia: Secondary | ICD-10-CM

## 2014-08-01 LAB — BASIC METABOLIC PANEL
Anion gap: 4 — ABNORMAL LOW (ref 5–15)
BUN: 32 mg/dL — ABNORMAL HIGH (ref 6–23)
CALCIUM: 8.7 mg/dL (ref 8.4–10.5)
CHLORIDE: 105 meq/L (ref 96–112)
CO2: 30 mmol/L (ref 19–32)
Creatinine, Ser: 1.15 mg/dL — ABNORMAL HIGH (ref 0.50–1.10)
GFR calc Af Amer: 55 mL/min — ABNORMAL LOW (ref >90)
GFR calc non Af Amer: 47 mL/min — ABNORMAL LOW (ref >90)
GLUCOSE: 100 mg/dL — AB (ref 70–99)
Potassium: 4.1 mmol/L (ref 3.5–5.1)
Sodium: 139 mmol/L (ref 135–145)

## 2014-08-01 LAB — GLUCOSE, CAPILLARY
GLUCOSE-CAPILLARY: 81 mg/dL (ref 70–99)
GLUCOSE-CAPILLARY: 254 mg/dL — AB (ref 70–99)
Glucose-Capillary: 181 mg/dL — ABNORMAL HIGH (ref 70–99)
Glucose-Capillary: 154 mg/dL — ABNORMAL HIGH (ref 70–99)

## 2014-08-01 MED ORDER — FUROSEMIDE 10 MG/ML IJ SOLN
80.0000 mg | Freq: Two times a day (BID) | INTRAMUSCULAR | Status: DC
  Administered 2014-08-01 – 2014-08-02 (×2): 80 mg via INTRAVENOUS
  Filled 2014-08-01 (×3): qty 8

## 2014-08-01 MED ORDER — ENSURE COMPLETE PO LIQD
237.0000 mL | Freq: Three times a day (TID) | ORAL | Status: DC
  Administered 2014-08-01 – 2014-08-05 (×11): 237 mL via ORAL

## 2014-08-01 MED ORDER — PNEUMOCOCCAL VAC POLYVALENT 25 MCG/0.5ML IJ INJ: 0.5000 mL | INJECTION | INTRAMUSCULAR | Status: DC

## 2014-08-01 MED ORDER — DOXYCYCLINE HYCLATE 100 MG PO TABS
100.0000 mg | ORAL_TABLET | Freq: Two times a day (BID) | ORAL | Status: DC
  Administered 2014-08-01 – 2014-08-02 (×2): 100 mg via ORAL
  Filled 2014-08-01 (×3): qty 1

## 2014-08-01 NOTE — Evaluation (Signed)
Physical Therapy Evaluation Patient Details Name: [name] [name] MRN: [id number] DOB: 01/04/1946 Today's Date: 08/01/2014   History of Present Illness  69 y.o. female admitted to [facility] on 07/30/14 for SOB.  Per ED notes, pt reports running out of her O2 at home.  Pt dx with acute respiratory failure with decompensated CHF, acute bronchitis in the setting of COPD, and CKD III.  Pt with significant PMHx of DM, HTN, immune deficiency disorder, and arthritis (legs and shoulders).   Clinical Impression  Pt is limited in her mobility mostly by respiratory status.  She desats with gait and gets more unsafe with walking as she speeds up to get back to the room and rest (she did not want to take a standing rest break in the hallway).  O2 sats decreased to 83% on 4 L O2 [place] during gait and DOE 3/4. She is likely close to her mobility baseline and I anticipate that she will not need PT at discharge.  PT will follow acutely to try to progress gait and activity tolerance while she is here in the acute hospital setting.     Follow Up Recommendations No PT follow up    Equipment Recommendations  None recommended by PT    Recommendations for Other Services   NA    Precautions / Restrictions Precautions Precautions: Other (comment) Precaution Comments: Monitor O2 sats during gait.       Mobility   Transfers Overall transfer level: Needs assistance Equipment used: Rolling walker (2 wheeled) Transfers: Sit to/from Stand Sit to Stand: Supervision         General transfer comment: supervision for safety due to reliance on hands for balance during transitions  Ambulation/Gait Ambulation/Gait assistance: Min guard Ambulation Distance (Feet): 150 Feet Assistive device: Rolling walker (2 wheeled) Gait Pattern/deviations: Step-through pattern;Trunk flexed     General Gait Details: Pt increased speed on second half due to DOE and wanting to get back to the room faster.  She did not want to stand and  take a rest break, instead she wanted the walk to be over faster.  O2 sats 83% on 4 L O2 [place] with gait and DOE 3/4.  Min guard assist for safety, especially as speed increased and she was trying to navigate obstacles in room (she was picking the entire walker up off of the ground).         Balance Overall balance assessment: Needs assistance Sitting-balance support: Feet supported;No upper extremity supported Sitting balance-Leahy Scale: Good     Standing balance support: Bilateral upper extremity supported;No upper extremity supported;Single extremity supported Standing balance-Leahy Scale: Good                               Pertinent Vitals/Pain Pain Assessment: Faces Faces Pain Scale: No hurt    Home Living Family/patient expects to be discharged to:: Private residence Living Arrangements: Spouse/significant other (boyfriend of 30 years) Available Help at Discharge: Family;Available 24 hours/day;Personal care attendant (aide x 5 days per week for 2-3 hours per day. ) Type of Home: Apartment Home Access: Level entry     Home Layout: One level Home Equipment: Walker - 2 wheels (home O2, she uses 2-3 L O2 [place] at home)      Prior Function Level of Independence: Independent with assistive device(s)         Comments: Pt uses RW for mobility at baseline.      Hand Dominance  Dominant Hand: Right    Extremity/Trunk Assessment   Upper Extremity Assessment: Generalized weakness           Lower Extremity Assessment: Generalized weakness      Cervical / Trunk Assessment: Normal  Communication   Communication: No difficulties  Cognition Arousal/Alertness: Awake/alert Behavior During Therapy: Restless (perseverating on dialing a phone number that doesn't work) Overall Cognitive Status: No family/caregiver present to determine baseline cognitive functioning (also difficult to assess due to education level)                                Assessment/Plan    PT Assessment Patient needs continued PT services  PT Diagnosis Difficulty walking;Abnormality of gait;Generalized weakness   PT Problem List Decreased strength;Decreased activity tolerance;Decreased balance;Decreased mobility;Decreased knowledge of use of DME;Cardiopulmonary status limiting activity  PT Treatment Interventions DME instruction;Gait training;Functional mobility training;Therapeutic activities;Therapeutic exercise;Balance training;Neuromuscular re-education;Cognitive remediation;Patient/family education   PT Goals (Current goals can be found in the Care Plan section) Acute Rehab PT Goals Patient Stated Goal: to find her ID and go home PT Goal Formulation: With patient Time For Goal Achievement: 08/15/14 Potential to Achieve Goals: Good    Frequency Min 3X/week    End of Session Equipment Utilized During Treatment: Gait belt;Oxygen Activity Tolerance: Patient limited by fatigue;Treatment limited secondary to medical complications (Comment) (limited by DOE) Patient left: in chair;with call bell/phone within reach;with chair alarm set           Time: 9030-0923 PT Time Calculation (min) (ACUTE ONLY): 24 min   Charges:   PT Evaluation $Initial PT Evaluation Tier I: 1 Procedure PT Treatments $Gait Training: 8-22 mins        Yeimi Debnam B. Chrishawn Boley, PT, DPT [phone]   08/01/2014, 2:51 PM

## 2014-08-01 NOTE — Progress Notes (Signed)
[facility] PROGRESS NOTE  [name] [name] MBP:[id number] DOB: 03/15/1946 DOA: 07/30/2014 PCP: No primary care provider on file.  Brief narrative 69 year old female with chronic severe systolic CHF, COPD, ongoing tobacco use, chronic kidney disease stage III, diabetes mellitus not on medications presented with progressive shortness of breath for the past 2 days. In the ED patient was found to be short of breath and wheezy. Chest x-ray showing congestion with BNP >4000. Patient given 60 mg IV Lasix in the ED and admitted to the hospitalist service.  Reportedly patient not compliant with her medications, is not aware who cc follows as outpatient or seeing a cardiologist.  Assessment/Plan: Acute respiratory failure with decompensated systolic CHF Last EF one month back of 20-25 % with diffuse hypokinesis - patinet requiring 5 L O2 via [place]. CXR showing mild CHF with lower lobe interstitial fibrosis. increase lasix further to 80 mg bid. Monitor strict I/O and daily weight. Continue Coreg and lisinopril. -Patient seems to be noncompliant with medications and outpatient follow-up. Consulted heart failure team to establish care further recommendations.  COPD Has acute bronchitis and requiring almost 5 L o2 via [place]. Continue  daily prednisone and nebs. . . Continue  Doxycycline.  Chronic kidney disease stage III Renal function at baseline. Monitor with diuresis  Type 2 diabetes mellitus Monitor on sliding scale insulin  Tobacco abuse Counseled on smoking cessation. Ordered nicotine patch.  Hypokalemia Replenished    HTN Continue home medications  Protein calorie malnutrition Nutrition consult  DVT prophylaxis: Subcutaneous heparin Diet: Heart healthy/ diabetic   Code Status: Full code Family Communication: None at bedside Disposition Plan: Home Once clinically improved.   Consultants:  None  Procedures:  None  Antibiotics:  Doxycycline  HPI/Subjective:  seen and  examined this morning.. Denies any specific symptoms. Requiring 4L via [place] to maintain saturation.  Objective: Filed Vitals:   08/01/14 0801  BP: 110/78  Pulse: 77  Temp: 97.8 F (36.6 C)  Resp: 18    Intake/Output Summary (Last 24 hours) at 08/01/14 1358 Last data filed at 08/01/14 0932  Gross per 24 hour  Intake   1580 ml  Output   2152 ml  Net   -572 ml   Filed Weights   07/30/14 0454 07/31/14 0509 08/01/14 0519  Weight: 51.347 kg (113 lb 3.2 oz) 51.8 kg (114 lb 3.2 oz) 50.993 kg (112 lb 6.7 oz)    Exam:   General:  Elderly thin built female no acute distress  HEENT:  moist oral mucosa, Supple neck,  Cardiovascular: Normal S1 and S2, systolic murmur 3/6  Respiratory: bibasilar crackles, no rhonchi or wheeze  Gastrointestinal , Nondistended, nontender,   Musculoskeletal:  trace edema bilaterally.    Data Reviewed: Basic Metabolic Panel:  Recent Labs Lab 07/30/14 0129 07/30/14 0544 07/31/14 0400 08/01/14 0402  NA 142 141 141 139  K 3.8 3.4* 4.0 4.1  CL 105 105 106 105  CO2 26 28 27 30   GLUCOSE 158* 177* 85 100*  BUN 32* 30* 34* 32*  CREATININE 1.42* 1.35* 1.32* 1.15*  CALCIUM 8.5 8.7 8.7 8.7   Liver Function Tests:  Recent Labs Lab 07/30/14 0544  AST 53*  ALT 28  ALKPHOS 191*  BILITOT 0.6  PROT 6.6  ALBUMIN 3.3*   No results for input(s): LIPASE, AMYLASE in the last 168 hours. No results for input(s): AMMONIA in the last 168 hours. CBC:  Recent Labs Lab 07/30/14 0129 07/30/14 0544  WBC 7.7 8.9  NEUTROABS  --  8.2*  HGB 12.2 12.0  HCT 40.5 40.0  MCV 93.1 92.8  PLT 184 179   Cardiac Enzymes:  Recent Labs Lab 07/30/14 0544 07/30/14 1300 07/30/14 1850  TROPONINI 0.03 0.03 0.03   BNP (last 3 results)  Recent Labs  06/21/14 0229  PROBNP 28259.0*   CBG:  Recent Labs Lab 07/31/14 1101 07/31/14 1619 07/31/14 2135 08/01/14 0604 08/01/14 1133  GLUCAP 144* 241* 244* 81 181*    No results found for this or any  previous visit (from the past 240 hour(s)).   Studies: Dg Chest 2 View  07/31/2014   CLINICAL DATA:  Chronic cough and shortness of breath that acutely worsened over the past several days and is associated with fever. Current history of hypertension, diabetes, COPD, stage III chronic kidney disease, and acute on chronic systolic heart failure.  EXAM: CHEST  2 VIEW  COMPARISON:  Portable chest x-ray yesterday, 06/02/2014. Two-view chest x-ray 06/21/2014, 09/26/2013.  FINDINGS: Cardiac silhouette markedly enlarged, unchanged. Thoracic aorta atherosclerotic, unchanged. Hilar and mediastinal contours otherwise unremarkable. Pulmonary venous hypertension and mild diffuse interstitial pulmonary edema as evidenced by Ker[place] B-lines, unchanged since yesterday. Chronic interstitial prominence in the lower lobes dating back to the March, 2015 examination. No new pulmonary parenchymal abnormalities. No visible pleural effusions currently. Osseous demineralization suspected with mild degenerative changes involving the thoracic spine.  IMPRESSION: 1. 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 2. No new abnormalities.   Electronically Signed   By: Hulan Saas M.D.   On: 07/31/2014 11:28    Scheduled Meds: . antiseptic oral rinse  7 mL Mouth Rinse BID  . budesonide (PULMICORT) nebulizer solution  0.25 mg Nebulization BID  . carvedilol  12.5 mg Oral BID WC  . doxycycline  100 mg Oral Q12H  . enoxaparin (LOVENOX) injection  40 mg Subcutaneous Q24H  . ergocalciferol  50,000 Units Oral Weekly  . furosemide  80 mg Intravenous Q12H  . gabapentin  300 mg Oral TID  . insulin aspart  0-9 Units Subcutaneous TID WC  . ipratropium-albuterol  3 mL Nebulization TID  . lisinopril  5 mg Oral Daily  . mirtazapine  30 mg Oral QHS  . nicotine  14 mg Transdermal Q24H  . potassium chloride  20 mEq Oral BID  . predniSONE  20 mg Oral Q breakfast  . rosuvastatin  40 mg Oral  Daily  . senna-docusate  1 tablet Oral BID  . sodium chloride  3 mL Intravenous Q12H   Continuous Infusions:     Time spent: 25 minutes    [name]  [facility] Pager [phone] If 7PM-7AM, please contact night-coverage at [email or web address], password [facility] 08/01/2014, 1:58 PM  LOS: 2 days

## 2014-08-01 NOTE — Consult Note (Signed)
Advanced Heart Failure Team Consult Note  Referring Physician: Dr Gonzella Lex Primary Physician:None Primary Cardiologist:  None  Reason for Consultation: Heart Failure   HPI:    [name] [name] is a 69 y.o. female with history of severe COPD with ongoing tobacco use (on home O2) and chronic systolic heart failure with EF 20%,  chronic kidney disease stage III, and diabetes mellitus. She has received most of her cardiac care in [facility].  Admitted to [facility] 12/8 through 06/23/14 with increased dyspnea.  Diuresed with IV lasix and transitioned to PO lasix. Also discharged on carvedilol 12.5 mg twice a day, lisinopril 5 mg daily, and lasix 40 mg daily. Discharge weight 122 pounds.   It is hard to get a history from her because she is perseverating on finding her "identity cards." She apparently lives with her boyfriend. Says she takes all her meds but admits to drinking lots of fluids. Does not take extra lasix when needed. Uses food stamps to buy her food. Needs assistance getting to appointments.   She presented to [facility] ED 07/30/14 with increased dyspnea and bloating after running out of her O2..  Admitting BNP 4000. Cardiac markers negative.  CXR with mild HF ? Chronic interstitial fibrosis.  Started on IV lasix.      2D Echo 06/21/2014 showing LVEF of 20-25%  Review of Systems: [y] = yes,  = no   General: Weight gain [ Y]; Weight loss ; Anorexia ; Fatigue [Y ]; Fever ; Chills ; Weakness [Y ]  Cardiac: Chest pain/pressure ; Resting SOB ; Exertional SOB Cove.[place]ienne ]; Orthopnea Cove.[place]ienne ]; Pedal Edema ; Palpitations ; Syncope ; Presyncope ; Paroxysmal nocturnal dyspnea[ ]   Pulmonary: Cough Cove.[place] ]; [facility] ]; Hemoptysis[ ] ; Sputum ; Snoring   GI: Vomiting[ ] ; Dysphagia[ ] ; Melena[ ] ; Hematochezia ; Heartburn[ ] ; Abdominal pain ; Constipation ; Diarrhea ; BRBPR   GU: Hematuria[ ] ; Dysuria ; Nocturia[ ]   Vascular: Pain in legs with walking ; Pain in feet with  lying flat ; Non-healing sores ; Stroke ; TIA ; Slurred speech ;  Neuro: Headaches[ ] ; Vertigo[ ] ; Seizures[ ] ; Paresthesias[ ] ;Blurred vision ; Diplopia ; Vision changes   Ortho/Skin: Arthritis Cove.[place]ienne ]; Joint pain [ y]; Muscle pain ; Joint swelling ; Back Pain ; Rash   Psych: Depression[ ] ; Anxiety[ ]   Heme: Bleeding problems ; Clotting disorders ; Anemia   Endocrine: Diabetes [Y ]; Thyroid dysfunction[ ]   Home Medications Prior to Admission medications   Medication Sig Start Date End Date Taking? Authorizing Provider  carvedilol (COREG) 12.5 MG tablet Take 12.5 mg by mouth 2 (two) times daily with a meal.   Yes Historical Provider, MD  ferrous sulfate 325 (65 FE) MG EC tablet Take 325 mg by mouth daily with breakfast.   Yes Historical Provider, MD  lisinopril (PRINIVIL,ZESTRIL) 5 MG tablet Take 5 mg by mouth daily.   Yes Historical Provider, MD  mirtazapine (REMERON) 30 MG tablet Take 30 mg by mouth at bedtime.   Yes Historical Provider, MD  rosuvastatin (CRESTOR) 40 MG tablet Take 40 mg by mouth daily.   Yes Historical Provider, MD  torsemide (DEMADEX) 20 MG tablet Take 20 mg by mouth 2 (two) times daily.   Yes Historical Provider, MD    Past Medical History: Past  Medical History  Diagnosis Date  . Diabetes mellitus without complication   . COPD (chronic obstructive pulmonary disease)   . Arthritis     legs, shoulders  . Immune deficiency disorder   . Hypertension   . Renal disorder   . CHF (congestive heart failure)   . Shortness of breath dyspnea     Past Surgical History: Past Surgical History  Procedure Laterality Date  . Excision of a boil      Family History: Family History  Problem Relation Age of Onset  . Other Mother     Per patient died from old age.  . Other Father     Per patient died from old age.    Social History: History   Social History  . Marital Status: Married    Spouse Name: N/A    Number of  Children: N/A  . Years of Education: N/A   Social History Main Topics  . Smoking status: Current Every Day Smoker -- 0.50 packs/day    Types: Cigarettes  . Smokeless tobacco: Never Used  . Alcohol Use: No  . Drug Use: Yes    Special: Marijuana  . Sexual Activity: None   Other Topics Concern  . None   Social History Narrative    Allergies:  No Known Allergies  Objective:    Vital Signs:   Temp:  [97.3 F (36.3 C)-98.2 F (36.8 C)] 97.3 F (36.3 C) (01/18 1542) Pulse Rate:  [77-89] 84 (01/18 1542) Resp:  [18-20] 18 (01/18 1542) BP: (110-118)/(74-93) 118/93 mmHg (01/18 1542) SpO2:  [83 %-98 %] 97 % (01/18 1542) Weight:  [112 lb 6.7 oz (50.993 kg)] 112 lb 6.7 oz (50.993 kg) (01/18 0519) Last BM Date: 07/31/14  Weight change: Filed Weights   07/30/14 0454 07/31/14 0509 08/01/14 0519  Weight: 113 lb 3.2 oz (51.347 kg) 114 lb 3.2 oz (51.8 kg) 112 lb 6.7 oz (50.993 kg)    Intake/Output:   Intake/Output Summary (Last 24 hours) at 08/01/14 1544 Last data filed at 08/01/14 1416  Gross per 24 hour  Intake   1700 ml  Output   1651 ml  Net     49 ml     Physical Exam: General:  Elderly. Frail Chronically ill appearing. No resp difficulty HEENT: normal Neck: supple. JVP ~10  . Carotids 2+ bilat; no bruits. No lymphadenopathy or thryomegaly appreciated. Cor: PMI laterally displaced. Regular rate & rhythm. No rubs, or murmurs. + S3  Lungs: poor air movement throughout basilar crackles Abdomen: soft, nontender, nondistended. No hepatosplenomegaly. No bruits or masses. Good bowel sounds. Extremities: no cyanosis, clubbing, rash, edema Neuro: alert & orientedx3, cranial nerves grossly intact. moves all 4 extremities w/o difficulty. Affect pleasant  Telemetry: NSR   Labs: Basic Metabolic Panel:  Recent Labs Lab 07/30/14 0129 07/30/14 0544 07/31/14 0400 08/01/14 0402  NA 142 141 141 139  K 3.8 3.4* 4.0 4.1  CL 105 105 106 105  CO2 GLUCOSE 158* 177*  85 100*  BUN 32* 30* 34* 32*  CREATININE 1.42* 1.35* 1.32* 1.15*  CALCIUM 8.5 8.7 8.7 8.7    Liver Function Tests:  Recent Labs Lab 07/30/14 0544  AST 53*  ALT 28  ALKPHOS 191*  BILITOT 0.6  PROT 6.6  ALBUMIN 3.3*   No results for input(s): LIPASE, AMYLASE in the last 168 hours. No results for input(s): AMMONIA in the last 168 hours.  CBC:  Recent Labs Lab 07/30/14 0129 07/30/14 0544  WBC  7.7 8.9  NEUTROABS  --  8.2*  HGB 12.2 12.0  HCT 40.5 40.0  MCV 93.1 92.8  PLT 184 179    Cardiac Enzymes:  Recent Labs Lab 07/30/14 0544 07/30/14 1300 07/30/14 1850  TROPONINI 0.03 0.03 0.03    BNP: BNP (last 3 results)  Recent Labs  06/21/14 0229  PROBNP 28259.0*    CBG:  Recent Labs Lab 07/31/14 1101 07/31/14 1619 07/31/14 2135 08/01/14 0604 08/01/14 1133  GLUCAP 144* 241* 244* 81 181*    Coagulation Studies: No results for input(s): LABPROT, INR in the last 72 hours.  Other results: EKG: SR 85 bpm   Imaging: Dg Chest 2 View  07/31/2014   CLINICAL DATA:  Chronic cough and shortness of breath that acutely worsened over the past several days and is associated with fever. Current history of hypertension, diabetes, COPD, stage III chronic kidney disease, and acute on chronic systolic heart failure.  EXAM: CHEST  2 VIEW  COMPARISON:  Portable chest x-ray yesterday, 06/02/2014. Two-view chest x-ray 06/21/2014, 09/26/2013.  FINDINGS: Cardiac silhouette markedly enlarged, unchanged. Thoracic aorta atherosclerotic, unchanged. Hilar and mediastinal contours otherwise unremarkable. Pulmonary venous hypertension and mild diffuse interstitial pulmonary edema as evidenced by Kerley B-lines, unchanged since yesterday. Chronic interstitial prominence in the lower lobes dating back to the March, 2015 examination. No new pulmonary parenchymal abnormalities. No visible pleural effusions currently. Osseous demineralization suspected with mild degenerative changes involving  the thoracic spine.  IMPRESSION: 1. 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 2. No new abnormalities.   Electronically Signed   By: Hulan Saas M.D.   On: 07/31/2014 11:28      Medications:     Current Medications: . antiseptic oral rinse  7 mL Mouth Rinse BID  . budesonide (PULMICORT) nebulizer solution  0.25 mg Nebulization BID  . carvedilol  12.5 mg Oral BID WC  . doxycycline  100 mg Oral Q12H  . enoxaparin (LOVENOX) injection  40 mg Subcutaneous Q24H  . ergocalciferol  50,000 Units Oral Weekly  . feeding supplement (ENSURE COMPLETE)  237 mL Oral TID BM  . furosemide  80 mg Intravenous Q12H  . gabapentin  300 mg Oral TID  . insulin aspart  0-9 Units Subcutaneous TID WC  . ipratropium-albuterol  3 mL Nebulization TID  . lisinopril  5 mg Oral Daily  . mirtazapine  30 mg Oral QHS  . nicotine  14 mg Transdermal Q24H  . potassium chloride  20 mEq Oral BID  . predniSONE  20 mg Oral Q breakfast  . rosuvastatin  40 mg Oral Daily  . senna-docusate  1 tablet Oral BID  . sodium chloride  3 mL Intravenous Q12H     Infusions:      Assessment:   1. A/C Systolic HF ECHO EF 20%  2. A/c hypoxic respiratory failure 3. Severe COPD 4. CKD Stage III 5. DM Type 2 6. Ongoing tobacco use 7. Noncompliance   Plan/Discussion:    Length of Stay: 2  CLEGG,AMY NP-C  08/01/2014, 3:44 PM  Advanced Heart Failure Team Pager [phone] (M-F; 7a - 4p)  Please contact [facility] for night-coverage after hours (4p -7a ) and weekends on [email or web address]  Patient seen and examined with Tonye Becket, NP. We discussed all aspects of the encounter. I agree with the assessment and plan as stated above.   She appears to be nearing her baseline weight but neck veins still up so will continue IV  lasix for now. Renal function stable. She has little insight into her HF and will need HHRN as well as probably paramedicine to help with her HF meds  and education. Given severe COPD may consider switching carvedilol to bisoprolol but she is tolerating well for now. Will need to get records from [facility] to see if she has had an ischemic work-up in past. Also would not refer for ICD until we have clearer picture of her ability to comply with f/u. Need to stop smoking stressed.  Truman Hayward 8:46 PM

## 2014-08-01 NOTE — Progress Notes (Signed)
INITIAL NUTRITION ASSESSMENT  DOCUMENTATION CODES Per approved criteria  -Severe malnutrition in the context of chronic illness  Pt meets criteria for severe MALNUTRITION in the context of chronic illness as evidenced by severe fat and muscle wasting.  INTERVENTION: - Ensure Complete po TID, each supplement provides 350 kcal and 13 grams of protein - RD will continue to monitor for nutrition care plan.  NUTRITION DIAGNOSIS: Inadequate oral intake related to shortness of breath as evidenced by reported poor po intake.   Goal: Pt to meet >/= 90% of their estimated nutrition needs   Monitor:  Weight trend, po intake, acceptance of supplements, labd  Reason for Assessment: Consult for nutrition assessment  69 y.o. female  Admitting Dx: <principal problem not specified>  ASSESSMENT: 69 y.o. female history of chronic systolic heart failure last year measured in December 2015 was 20%, COPD, ongoing tobacco abuse, chronic kidney disease stage III, diabetes mellitus not on medication presents to the ER because of shortness of breath.  - Pt reports poor appetite prior to admission. She said that her appetite has improved since admission to the hospital.  - Pt ate 100% of lunch today and po intake is recorded as 100% at all meals. - Pt reports no recent weight loss.  - Pt agreed to drink nutritional supplements. She said that her mother used to drink them.   Nutrition Focused Physical Exam:  Subcutaneous Fat:  Orbital Region: moderate wasting Upper Arm Region: moderate wasting Thoracic and Lumbar Region: n/a  Muscle:  Temple Region: moderate to severe wasting Clavicle Bone Region: severe wasting Clavicle and Acromion Bone Region: moderate to severe wasting Scapular Bone Region: n/a Dorsal Hand: moderate wasting Patellar Region: moderate wasting Anterior Thigh Region: mild to moderate wasting Posterior Calf Region: mild to moderate wasting  Height: Ht Readings from Last 1  Encounters:  07/30/14  (1.6 m)    Weight: Wt Readings from Last 1 Encounters:  08/01/14 112 lb 6.7 oz (50.993 kg)    Ideal Body Weight: 115 lb  % Ideal Body Weight: 97%  Wt Readings from Last 10 Encounters:  08/01/14 112 lb 6.7 oz (50.993 kg)  06/23/14 122 lb 5.7 oz (55.5 kg)  01/10/14 100 lb 5 oz (45.5 kg)    BMI:  Body mass index is 19.92 kg/(m^2).  Estimated Nutritional Needs: Kcal: 1500-1700 Protein: 75-85 g Fluid: 1.7 L/day  Skin: Intact  Diet Order: Diet heart healthy/carb modified  EDUCATION NEEDS: -Education needs addressed   Intake/Output Summary (Last 24 hours) at 08/01/14 1447 Last data filed at 08/01/14 1416  Gross per 24 hour  Intake   1700 ml  Output   2152 ml  Net   -452 ml    Last BM: 1/17   Labs:   Recent Labs Lab 07/30/14 0544 07/31/14 0400 08/01/14 0402  NA 141 141 139  K 3.4* 4.0 4.1  CL 105 106 105  CO2 BUN 30* 34* 32*  CREATININE 1.35* 1.32* 1.15*  CALCIUM 8.7 8.7 8.7  GLUCOSE 177* 85 100*    CBG (last 3)   Recent Labs  07/31/14 2135 08/01/14 0604 08/01/14 1133  GLUCAP 244* 81 181*    Scheduled Meds: . antiseptic oral rinse  7 mL Mouth Rinse BID  . budesonide (PULMICORT) nebulizer solution  0.25 mg Nebulization BID  . carvedilol  12.5 mg Oral BID WC  . doxycycline  100 mg Oral Q12H  . enoxaparin (LOVENOX) injection  40 mg Subcutaneous Q24H  . ergocalciferol  50,000 Units Oral Weekly  . furosemide  80 mg Intravenous Q12H  . gabapentin  300 mg Oral TID  . insulin aspart  0-9 Units Subcutaneous TID WC  . ipratropium-albuterol  3 mL Nebulization TID  . lisinopril  5 mg Oral Daily  . mirtazapine  30 mg Oral QHS  . nicotine  14 mg Transdermal Q24H  . potassium chloride  20 mEq Oral BID  . predniSONE  20 mg Oral Q breakfast  . rosuvastatin  40 mg Oral Daily  . senna-docusate  1 tablet Oral BID  . sodium chloride  3 mL Intravenous Q12H    Continuous Infusions:   Past Medical History  Diagnosis  Date  . Diabetes mellitus without complication   . COPD (chronic obstructive pulmonary disease)   . Arthritis     legs, shoulders  . Immune deficiency disorder   . Hypertension   . Renal disorder   . CHF (congestive heart failure)   . Shortness of breath dyspnea     Past Surgical History  Procedure Laterality Date  . Excision of a boil      Emmaline Kluver MS, RD, LDN

## 2014-08-02 ENCOUNTER — Inpatient Hospital Stay (HOSPITAL_COMMUNITY): Payer: Medicare Other

## 2014-08-02 DIAGNOSIS — J410 Simple chronic bronchitis: Secondary | ICD-10-CM

## 2014-08-02 LAB — CBC
HEMATOCRIT: 41.3 % (ref 36.0–46.0)
Hemoglobin: 12.4 g/dL (ref 12.0–15.0)
MCH: 28.1 pg (ref 26.0–34.0)
MCHC: 30 g/dL (ref 30.0–36.0)
MCV: 93.4 fL (ref 78.0–100.0)
PLATELETS: 238 10*3/uL (ref 150–400)
RBC: 4.42 MIL/uL (ref 3.87–5.11)
RDW: 19.3 % — AB (ref 11.5–15.5)
WBC: 11.6 10*3/uL — ABNORMAL HIGH (ref 4.0–10.5)

## 2014-08-02 LAB — BASIC METABOLIC PANEL
Anion gap: 10 (ref 5–15)
BUN: 36 mg/dL — ABNORMAL HIGH (ref 6–23)
CHLORIDE: 102 meq/L (ref 96–112)
CO2: 28 mmol/L (ref 19–32)
CREATININE: 1.11 mg/dL — AB (ref 0.50–1.10)
Calcium: 8.7 mg/dL (ref 8.4–10.5)
GFR calc Af Amer: 57 mL/min — ABNORMAL LOW (ref >90)
GFR calc non Af Amer: 49 mL/min — ABNORMAL LOW (ref >90)
Glucose, Bld: 85 mg/dL (ref 70–99)
POTASSIUM: 4.5 mmol/L (ref 3.5–5.1)
Sodium: 140 mmol/L (ref 135–145)

## 2014-08-02 LAB — GLUCOSE, CAPILLARY
GLUCOSE-CAPILLARY: 122 mg/dL — AB (ref 70–99)
Glucose-Capillary: 95 mg/dL (ref 70–99)
Glucose-Capillary: 208 mg/dL — ABNORMAL HIGH (ref 70–99)
Glucose-Capillary: 352 mg/dL — ABNORMAL HIGH (ref 70–99)

## 2014-08-02 MED ORDER — TORSEMIDE 20 MG PO TABS
20.0000 mg | ORAL_TABLET | Freq: Two times a day (BID) | ORAL | Status: DC
  Administered 2014-08-02 – 2014-08-03 (×2): 20 mg via ORAL
  Filled 2014-08-02 (×4): qty 1

## 2014-08-02 MED ORDER — LEVOFLOXACIN IN D5W 750 MG/150ML IV SOLN
750.0000 mg | INTRAVENOUS | Status: DC
  Administered 2014-08-02: 750 mg via INTRAVENOUS
  Filled 2014-08-02 (×2): qty 150

## 2014-08-02 NOTE — Progress Notes (Addendum)
[facility] PROGRESS NOTE  [name] [name] FFM:[id number] DOB: 1946/02/10 DOA: 07/30/2014 PCP: Jackie Plum, MD  Brief narrative 69 year old female with chronic severe systolic CHF, COPD, ongoing tobacco use, chronic kidney disease stage III, diabetes mellitus not on medications presented with progressive shortness of breath for the past 2 days. In the ED patient was found to be short of breath and wheezy. Chest x-ray showing congestion with BNP >4000. Patient given 60 mg IV Lasix in the ED and admitted to the hospitalist service.  Reportedly patient not compliant with her medications, is not aware who cc follows as outpatient or seeing a cardiologist.  Assessment/Plan: Acute respiratory failure with decompensated systolic CHF Last EF one month back of 20-25 % with diffuse hypokinesis -Patient required 5 L oxygen via nasal cannula. Titrated down today but decided to low 80s on room air. Currently maintaining sats on 2 L via nasal cannula. Likely due to combination of underlying CHF and COPD. She will need home O2 upon discharge. -. CXR showing mild CHF with lower lobe interstitial fibrosis. increased lasix further to 80 mg bid. Monitor strict I/O and daily weight. Follow-up chest x-ray done for oxygen desaturation shows improvement in pulmonary vascular congestion. No infiltrate. -has good diuresis with close to 5 L negative balance. Continue Coreg and lisinopril. -Patient seems to be noncompliant with medications and outpatient follow-up.  -Appreciate heart failure team consult  COPD Has acute bronchitis and requiring almost 5 L o2 via [place]. Continue  daily prednisone and nebs. . . Switch doxycycline to Levaquin.  Chronic kidney disease stage III Renal function at baseline. Monitor with diuresis  Type 2 diabetes mellitus Monitor on sliding scale insulin  Tobacco abuse Counseled on smoking cessation. Ordered nicotine patch.  Hypokalemia Replenished    HTN Continue home  medications  Protein calorie malnutrition Nutrition consult  DVT prophylaxis: Subcutaneous heparin Diet: Heart healthy/ diabetic   Code Status: Full code Family Communication: None at bedside Disposition Plan: Home possibly tomorrow if bleeding continues to improve diabetes 12   Consultants:  None  Procedures:  None  Antibiotics:  Doxycycline 1/16-1/18  Levofloxacin 1/19--  HPI/Subjective:  seen and examined this morning.. reports breathing to be better.. Oxygen titrated down to room air but desatted to low 80s. Currently maintaining on 2 L.  Objective: Filed Vitals:   08/02/14 0511  BP: 115/92  Pulse: 85  Temp: 97.6 F (36.4 C)  Resp: 20    Intake/Output Summary (Last 24 hours) at 08/02/14 1313 Last data filed at 08/02/14 1057  Gross per 24 hour  Intake    720 ml  Output   3000 ml  Net  -2280 ml   Filed Weights   07/31/14 0509 08/01/14 0519 08/02/14 0511  Weight: 51.8 kg (114 lb 3.2 oz) 50.993 kg (112 lb 6.7 oz) 50.18 kg (110 lb 10 oz)    Exam:   General:  Elderly thin built female no acute distress  HEENT:  moist oral mucosa, Supple neck,  Cardiovascular: Normal S1 and S2, systolic murmur 3/6  Respiratory: Fine bilateral lung crackles ( improved)  no rhonchi or wheeze  Gastrointestinal , Nondistended, nontender,   Musculoskeletal:  Warm, no edema  CNS: Alert and oriented    Data Reviewed: Basic Metabolic Panel:  Recent Labs Lab 07/30/14 0129 07/30/14 0544 07/31/14 0400 08/01/14 0402 08/02/14 0326  NA 142 141 141 139 140  K 3.8 3.4* 4.0 4.1 4.5  CL 105 105 106 105 102  CO2 26 28 27 30 28   GLUCOSE 158*  177* 85 100* 85  BUN 32* 30* 34* 32* 36*  CREATININE 1.42* 1.35* 1.32* 1.15* 1.11*  CALCIUM 8.5 8.7 8.7 8.7 8.7   Liver Function Tests:  Recent Labs Lab 07/30/14 0544  AST 53*  ALT 28  ALKPHOS 191*  BILITOT 0.6  PROT 6.6  ALBUMIN 3.3*   No results for input(s): LIPASE, AMYLASE in the last 168 hours. No results for  input(s): AMMONIA in the last 168 hours. CBC:  Recent Labs Lab 07/30/14 0129 07/30/14 0544 08/02/14 1000  WBC 7.7 8.9 11.6*  NEUTROABS  --  8.2*  --   HGB 12.2 12.0 12.4  HCT 40.5 40.0 41.3  MCV 93.1 92.8 93.4  PLT 184 179 238   Cardiac Enzymes:  Recent Labs Lab 07/30/14 0544 07/30/14 1300 07/30/14 1850  TROPONINI 0.03 0.03 0.03   BNP (last 3 results)  Recent Labs  06/21/14 0229  PROBNP 28259.0*   CBG:  Recent Labs Lab 08/01/14 1133 08/01/14 1659 08/01/14 2134 08/02/14 0552 08/02/14 1055  GLUCAP 181* 254* 154* 95 122*    No results found for this or any previous visit (from the past 240 hour(s)).   Studies: Dg Chest 2 View  08/02/2014   CLINICAL DATA:  Dyspnea.  EXAM: CHEST  2 VIEW  COMPARISON:  07/31/2014  FINDINGS: Exam demonstrates adequate lung volumes with mild stable interstitial prominence over the lung bases. Interval improvement in the previously mild vascular congestion. No focal consolidation or effusion. There is moderate stable cardiomegaly. There is moderate calcified plaque over the aortic arch.  IMPRESSION: Interval improvement in previously seen mild vascular congestion.  Stable moderate cardiomegaly.  Stable mild bibasilar interstitial changes.   Electronically Signed   By: Elberta Fortis M.D.   On: 08/02/2014 12:59    Scheduled Meds: . antiseptic oral rinse  7 mL Mouth Rinse BID  . budesonide (PULMICORT) nebulizer solution  0.25 mg Nebulization BID  . carvedilol  12.5 mg Oral BID WC  . enoxaparin (LOVENOX) injection  40 mg Subcutaneous Q24H  . ergocalciferol  50,000 Units Oral Weekly  . feeding supplement (ENSURE COMPLETE)  237 mL Oral TID BM  . gabapentin  300 mg Oral TID  . insulin aspart  0-9 Units Subcutaneous TID WC  . ipratropium-albuterol  3 mL Nebulization TID  . levofloxacin (LEVAQUIN) IV  750 mg Intravenous Q24H  . lisinopril  5 mg Oral Daily  . mirtazapine  30 mg Oral QHS  . nicotine  14 mg Transdermal Q24H  . potassium  chloride  20 mEq Oral BID  . predniSONE  20 mg Oral Q breakfast  . rosuvastatin  40 mg Oral Daily  . senna-docusate  1 tablet Oral BID  . sodium chloride  3 mL Intravenous Q12H  . torsemide  20 mg Oral BID   Continuous Infusions:     Time spent: 25 minutes    [name] [name]  [facility] Pager [phone] If 7PM-7AM, please contact night-coverage at [email or web address], password [facility] 08/02/2014, 1:13 PM  LOS: 3 days

## 2014-08-02 NOTE — Progress Notes (Signed)
1358 Offered to walk with pt who declined. Would not walk due to upper leg pain. Stated that she has this at home that comes and goes. Tried to get pt to try but stated not able. Will continue to follow. Luetta Nutting RN BSN 08/02/2014 1:59 PM

## 2014-08-02 NOTE — Care Management Note (Addendum)
  Page 2 of 2   08/04/2014     4:13:08 PM CARE MANAGEMENT NOTE 08/04/2014  Patient:  [name] [name],[name] [name]   Account Number:  [id number]  Date Initiated:  08/02/2014  Documentation initiated by:  Donato Schultz  Subjective/Objective Assessment:   Acute exacerbation of CHF  EF 20-25%     Action/Plan:   CM to follow for disposition needs   Anticipated DC Date:  08/03/2014   Anticipated DC Plan:  HOME W HOME HEALTH SERVICES      DC Planning Services  CM consult      [facility] Choice  HOME HEALTH   Choice offered to / List presented to:  C-1 Patient        HH arranged  HH-1 RN  HH-10 DISEASE MANAGEMENT      HH agency  [facility]  HF Clinic - Other   Status of service:  Completed, signed off Medicare Important Message given?  YES (If response is "NO", the following Medicare IM given date fields will be blank) Date Medicare IM given:  08/02/2014 Medicare IM given by:  Marcheta Horsey Date Additional Medicare IM given:  08/04/2014 Additional Medicare IM given by:  Deshon Koslowski  Discharge Disposition:  HOME W HOME HEALTH SERVICES  Per UR Regulation:  Reviewed for med. necessity/level of care/duration of stay  If discussed at Long Length of Stay Meetings, dates discussed:    Comments:  Nocole Zammit RN, BSN, MSHL, CCM  Nurse - Case Manager,  (Unit [place]) [phone]  08/04/2014 Palliative Consult pending   Donato Schultz RN, BSN, MSHL, CCM  Nurse - Case Manager,  ([facility] [place]) [phone]  08/02/2014 Social:  From home with boyfriend Home DME:  Home oxygen (Provider:  [facility]), RW PT RECS:  None CHF Clinic:  yes Disposition Plan:  HHS:  RN (CHF) ([facility]/Donna notified)

## 2014-08-02 NOTE — Progress Notes (Deleted)
1730 Pm care done. Pt cooperative . Pleasant calm and conversant . Aware of PEG placement tom

## 2014-08-02 NOTE — Progress Notes (Signed)
Advanced Heart Failure Rounding Note   Subjective:      [name] [name] is a 69 y.o. female with history of severe COPD with ongoing tobacco use (on home O2) and chronic systolic heart failure with EF 20%, chronic kidney disease stage III, and diabetes mellitus. She has received most of her cardiac care in [facility].  Admitted to [facility] 12/8 through 06/23/14 with increased dyspnea. Diuresed with IV lasix and transitioned to PO lasix. Also discharged on carvedilol 12.5 mg twice a day, lisinopril 5 mg daily, and lasix 40 mg daily. Discharge weight 122 pounds.   Admitted with volume overload. Started on IV lasix. Weight down 2 pounds. Diuresed 2.0 liters.   Dyspneic at when talking. Had chills over night. Increased Cough. Feels awful.     2D Echo 06/21/2014 showing LVEF of 20-25%   Objective:   Weight Range:  Vital Signs:   Temp:  [97.3 F (36.3 C)-97.6 F (36.4 C)] 97.6 F (36.4 C) (01/19 0511) Pulse Rate:  [84-88] 85 (01/19 0511) Resp:  [18-20] 20 (01/19 0511) BP: (113-118)/(53-93) 115/92 mmHg (01/19 0511) SpO2:  [83 %-100 %] 100 % (01/19 0511) Weight:  [110 lb 10 oz (50.18 kg)] 110 lb 10 oz (50.18 kg) (01/19 0511) Last BM Date: 08/01/14  Weight change: Filed Weights   07/31/14 0509 08/01/14 0519 08/02/14 0511  Weight: 114 lb 3.2 oz (51.8 kg) 112 lb 6.7 oz (50.993 kg) 110 lb 10 oz (50.18 kg)    Intake/Output:   Intake/Output Summary (Last 24 hours) at 08/02/14 0916 Last data filed at 08/02/14 1610  Gross per 24 hour  Intake    720 ml  Output   3050 ml  Net  -2330 ml     Physical Exam: General: Elderly. Frail Chronically ill appearing. Dyspneic when talking.  HEENT: normal Neck: supple. JVP ~7-8 . Carotids 2+ bilat; no bruits. No lymphadenopathy or thryomegaly appreciated. Cor: PMI laterally displaced. Regular rate & rhythm. No rubs, or murmurs. + S3  Lungs: LUL LLL crackles on 2 liters [place]  Abdomen: soft, nontender, nondistended. No hepatosplenomegaly. No bruits or  masses. Good bowel sounds. Extremities: no cyanosis, clubbing, rash, edema Neuro: alert & orientedx3, cranial nerves grossly intact. moves all 4 extremities w/o difficulty. Affect pleasant  Telemetry: NSR   Labs: Basic Metabolic Panel:  Recent Labs Lab 07/30/14 0129 07/30/14 0544 07/31/14 0400 08/01/14 0402 08/02/14 0326  NA 142 141 141 139 140  K 3.8 3.4* 4.0 4.1 4.5  CL 105 105 106 105 102  CO2 GLUCOSE 158* 177* 85 100* 85  BUN 32* 30* 34* 32* 36*  CREATININE 1.42* 1.35* 1.32* 1.15* 1.11*  CALCIUM 8.5 8.7 8.7 8.7 8.7    Liver Function Tests:  Recent Labs Lab 07/30/14 0544  AST 53*  ALT 28  ALKPHOS 191*  BILITOT 0.6  PROT 6.6  ALBUMIN 3.3*   No results for input(s): LIPASE, AMYLASE in the last 168 hours. No results for input(s): AMMONIA in the last 168 hours.  CBC:  Recent Labs Lab 07/30/14 0129 07/30/14 0544  WBC 7.7 8.9  NEUTROABS  --  8.2*  HGB 12.2 12.0  HCT 40.5 40.0  MCV 93.1 92.8  PLT 184 179    Cardiac Enzymes:  Recent Labs Lab 07/30/14 0544 07/30/14 1300 07/30/14 1850  TROPONINI 0.03 0.03 0.03    BNP: BNP (last 3 results)  Recent Labs  06/21/14 0229  PROBNP 28259.0*     Other results:  Imaging: Dg Chest 2 View  07/31/2014   CLINICAL DATA:  Chronic cough and shortness of breath that acutely worsened over the past several days and is associated with fever. Current history of hypertension, diabetes, COPD, stage III chronic kidney disease, and acute on chronic systolic heart failure.  EXAM: CHEST  2 VIEW  COMPARISON:  Portable chest x-ray yesterday, 06/02/2014. Two-view chest x-ray 06/21/2014, 09/26/2013.  FINDINGS: Cardiac silhouette markedly enlarged, unchanged. Thoracic aorta atherosclerotic, unchanged. Hilar and mediastinal contours otherwise unremarkable. Pulmonary venous hypertension and mild diffuse interstitial pulmonary edema as evidenced by Kerley B-lines, unchanged since yesterday. Chronic  interstitial prominence in the lower lobes dating back to the March, 2015 examination. No new pulmonary parenchymal abnormalities. No visible pleural effusions currently. Osseous demineralization suspected with mild degenerative changes involving the thoracic spine.  IMPRESSION: 1. 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 2. No new abnormalities.   Electronically Signed   By: Hulan Saas M.D.   On: 07/31/2014 11:28      Medications:     Scheduled Medications: . antiseptic oral rinse  7 mL Mouth Rinse BID  . budesonide (PULMICORT) nebulizer solution  0.25 mg Nebulization BID  . carvedilol  12.5 mg Oral BID WC  . doxycycline  100 mg Oral Q12H  . enoxaparin (LOVENOX) injection  40 mg Subcutaneous Q24H  . ergocalciferol  50,000 Units Oral Weekly  . feeding supplement (ENSURE COMPLETE)  237 mL Oral TID BM  . furosemide  80 mg Intravenous Q12H  . gabapentin  300 mg Oral TID  . insulin aspart  0-9 Units Subcutaneous TID WC  . ipratropium-albuterol  3 mL Nebulization TID  . lisinopril  5 mg Oral Daily  . mirtazapine  30 mg Oral QHS  . nicotine  14 mg Transdermal Q24H  . potassium chloride  20 mEq Oral BID  . predniSONE  20 mg Oral Q breakfast  . rosuvastatin  40 mg Oral Daily  . senna-docusate  1 tablet Oral BID  . sodium chloride  3 mL Intravenous Q12H     Infusions:     PRN Medications:  acetaminophen **OR** acetaminophen, albuterol, bisacodyl, ondansetron **OR** ondansetron (ZOFRAN) IV   Assessment:   1. A/C Systolic HF ECHO 78/2956  EF 20%  2. A/c hypoxic respiratory failure 3. Severe COPD 4. CKD Stage III 5. DM Type 2 6. Ongoing tobacco use 7. Noncompliance     Plan/Discussion:    Dyspneic when talking with oxygne saturations < 88% . Does not appear volume overloaded.Weight down another 2 pounds. Continue IV lasix for now and will likely switch to torsemide  20 mg twice a day tomorrow. Continue  carvedilol 12.5 mg twice a day and lisinopril 5 mg daily. Would not place on spiro or dig due to noncompliance.   Had chills last night. O2 sats down to 83% at rest.  Afebrile. Check CBC. Check CXR now. Needs pulmonary aggressive pulmonary toilet.   Will refer to paramedicine and HF SW.     Length of Stay: 3   CLEGG,[name] NP_  08/02/2014, 9:16 AM  Advanced Heart Failure Team Pager [phone] (M-F; 7a - 4p)  Please contact [facility] for night-coverage after hours (4p -7a ) and weekends on [email or web address]  Patient seen and examined with Tonye Becket, [name]. We discussed all aspects of the encounter. I agree with the assessment and plan as stated above.   Volume status much better. Agree with switching back to po diuretics. Respiratory  status worse. Consider expanding abx regimen. Repeat CXR pending.   Reuel Boom Monnie Anspach,MD 12:48 PM

## 2014-08-02 NOTE — Progress Notes (Signed)
Pt without IV access this AM d/t IV malpositioned and unable to flush. IV stick attempt x2. Unable to give IV lasix. Will pass on to day shift.

## 2014-08-02 NOTE — Progress Notes (Signed)
Resting comfprtably in bed. Less occurrence of coughing

## 2014-08-03 DIAGNOSIS — J9621 Acute and chronic respiratory failure with hypoxia: Secondary | ICD-10-CM | POA: Insufficient documentation

## 2014-08-03 LAB — GLUCOSE, CAPILLARY
GLUCOSE-CAPILLARY: 108 mg/dL — AB (ref 70–99)
Glucose-Capillary: 95 mg/dL (ref 70–99)
Glucose-Capillary: 280 mg/dL — ABNORMAL HIGH (ref 70–99)
Glucose-Capillary: 259 mg/dL — ABNORMAL HIGH (ref 70–99)

## 2014-08-03 MED ORDER — FUROSEMIDE 10 MG/ML IJ SOLN
80.0000 mg | Freq: Once | INTRAMUSCULAR | Status: AC
  Administered 2014-08-03: 80 mg via INTRAVENOUS

## 2014-08-03 MED ORDER — CARVEDILOL 6.25 MG PO TABS
6.2500 mg | ORAL_TABLET | Freq: Two times a day (BID) | ORAL | Status: DC
  Administered 2014-08-03 – 2014-08-05 (×4): 6.25 mg via ORAL
  Filled 2014-08-03 (×6): qty 1

## 2014-08-03 MED ORDER — LEVOFLOXACIN IN D5W 750 MG/150ML IV SOLN: 750.0000 mg | INTRAVENOUS | Status: DC

## 2014-08-03 MED ORDER — FUROSEMIDE 10 MG/ML IJ SOLN
80.0000 mg | Freq: Two times a day (BID) | INTRAMUSCULAR | Status: DC
  Administered 2014-08-03 – 2014-08-05 (×4): 80 mg via INTRAVENOUS
  Filled 2014-08-03 (×5): qty 8

## 2014-08-03 MED ORDER — LEVOFLOXACIN IN D5W 500 MG/100ML IV SOLN
500.0000 mg | INTRAVENOUS | Status: DC
  Filled 2014-08-03: qty 100

## 2014-08-03 NOTE — Progress Notes (Signed)
Advanced Heart Failure Rounding Note   Subjective:      [name] [name] is a 69 y.o. female with history of severe COPD with ongoing tobacco use (on home O2) and chronic systolic heart failure with EF 20%, chronic kidney disease stage III, and diabetes mellitus. She has received most of her cardiac care in [facility].  Admitted to [facility] 12/8 through 06/23/14 with increased dyspnea. Diuresed with IV lasix and transitioned to PO lasix. Also discharged on carvedilol 12.5 mg twice a day, lisinopril 5 mg daily, and lasix 40 mg daily. Discharge weight 122 pounds.   Admitted with volume overload. Started on IV lasix.   Yesterday switched to po diuretics. Was more SOB this am. IV lasix restarted. CXR without infiltrate. Weight inaccurate. No BMET today.     2D Echo 06/21/2014 showing LVEF of 20-25%   Objective:   Weight Range:  Vital Signs:   Temp:  [97.2 F (36.2 C)-98.3 F (36.8 C)] 97.2 F (36.2 C) (01/20 1300) Pulse Rate:  [63-92] 87 (01/20 1300) Resp:  [16-20] 20 (01/20 1300) BP: (108-111)/(64-81) 111/81 mmHg (01/20 1300) SpO2:  [92 %-96 %] 96 % (01/20 1300) Weight:  [45.995 kg (101 lb 6.4 oz)] 45.995 kg (101 lb 6.4 oz) (01/20 0521) Last BM Date: 08/02/14  Weight change: Filed Weights   08/01/14 0519 08/02/14 0511 08/03/14 0521  Weight: 50.993 kg (112 lb 6.7 oz) 50.18 kg (110 lb 10 oz) 45.995 kg (101 lb 6.4 oz)    Intake/Output:   Intake/Output Summary (Last 24 hours) at 08/03/14 1449 Last data filed at 08/03/14 1152  Gross per 24 hour  Intake   1120 ml  Output   1526 ml  Net   -406 ml     Physical Exam: General: Elderly. Frail Chronically ill appearing. Dyspneic when talking.  HEENT: normal Neck: supple. JVP ~10 . Carotids 2+ bilat; no bruits. No lymphadenopathy or thryomegaly appreciated. Cor: PMI laterally displaced. Regular rate & rhythm. No rubs, or murmurs. + S3  Lungs: crackles at bases Abdomen: soft, nontender, nondistended. No hepatosplenomegaly. No bruits  or masses. Good bowel sounds. Extremities: no cyanosis, clubbing, rash, edema Neuro: alert & orientedx3, cranial nerves grossly intact. moves all 4 extremities w/o difficulty. Affect pleasant  Telemetry: NSR   Labs: Basic Metabolic Panel:  Recent Labs Lab 07/30/14 0129 07/30/14 0544 07/31/14 0400 08/01/14 0402 08/02/14 0326  NA 142 141 141 139 140  K 3.8 3.4* 4.0 4.1 4.5  CL 105 105 106 105 102  CO2 GLUCOSE 158* 177* 85 100* 85  BUN 32* 30* 34* 32* 36*  CREATININE 1.42* 1.35* 1.32* 1.15* 1.11*  CALCIUM 8.5 8.7 8.7 8.7 8.7    Liver Function Tests:  Recent Labs Lab 07/30/14 0544  AST 53*  ALT 28  ALKPHOS 191*  BILITOT 0.6  PROT 6.6  ALBUMIN 3.3*   No results for input(s): LIPASE, AMYLASE in the last 168 hours. No results for input(s): AMMONIA in the last 168 hours.  CBC:  Recent Labs Lab 07/30/14 0129 07/30/14 0544 08/02/14 1000  WBC 7.7 8.9 11.6*  NEUTROABS  --  8.2*  --   HGB 12.2 12.0 12.4  HCT 40.5 40.0 41.3  MCV 93.1 92.8 93.4  PLT 184 179 238    Cardiac Enzymes:  Recent Labs Lab 07/30/14 0544 07/30/14 1300 07/30/14 1850  TROPONINI 0.03 0.03 0.03    BNP: BNP (last 3 results)  Recent Labs  06/21/14 0229  PROBNP 28259.0*  Other results:    Imaging: Dg Chest 2 View  08/02/2014   CLINICAL DATA:  Dyspnea.  EXAM: CHEST  2 VIEW  COMPARISON:  07/31/2014  FINDINGS: Exam demonstrates adequate lung volumes with mild stable interstitial prominence over the lung bases. Interval improvement in the previously mild vascular congestion. No focal consolidation or effusion. There is moderate stable cardiomegaly. There is moderate calcified plaque over the aortic arch.  IMPRESSION: Interval improvement in previously seen mild vascular congestion.  Stable moderate cardiomegaly.  Stable mild bibasilar interstitial changes.   Electronically Signed   By: Elberta Fortis M.D.   On: 08/02/2014 12:59     Medications:     Scheduled  Medications: . antiseptic oral rinse  7 mL Mouth Rinse BID  . budesonide (PULMICORT) nebulizer solution  0.25 mg Nebulization BID  . carvedilol  12.5 mg Oral BID WC  . enoxaparin (LOVENOX) injection  40 mg Subcutaneous Q24H  . ergocalciferol  50,000 Units Oral Weekly  . feeding supplement (ENSURE COMPLETE)  237 mL Oral TID BM  . gabapentin  300 mg Oral TID  . insulin aspart  0-9 Units Subcutaneous TID WC  . ipratropium-albuterol  3 mL Nebulization TID  . [START ON 08/04/2014] levofloxacin (LEVAQUIN) IV  500 mg Intravenous Q48H  . lisinopril  5 mg Oral Daily  . mirtazapine  30 mg Oral QHS  . nicotine  14 mg Transdermal Q24H  . potassium chloride  20 mEq Oral BID  . predniSONE  20 mg Oral Q breakfast  . rosuvastatin  40 mg Oral Daily  . senna-docusate  1 tablet Oral BID  . sodium chloride  3 mL Intravenous Q12H  . torsemide  20 mg Oral BID    Infusions:    PRN Medications: acetaminophen **OR** acetaminophen, albuterol, bisacodyl, ondansetron **OR** ondansetron (ZOFRAN) IV   Assessment:   1. A/C Systolic HF ECHO 85/8850  EF 20%  2. A/c hypoxic respiratory failure 3. Severe COPD 4. CKD Stage III 5. DM Type 2 6. Ongoing tobacco use 7. Noncomplianc    Plan/Discussion:    Remains volume overload. Continue IV lasix 80 bid. Decrease carvedilol 6.25 mg twice a day and lisinopril 5 mg daily. Would not place on spiro or dig due to noncompliance. Will need to get records to see if she had ischemic evaluation in past. Has not had CP and not CABG candidate so utility of cath lower at this point.   Will refer to paramedicine and HF SW.    Length of Stay: 4   Arvilla Meres MD 08/03/2014, 2:49 PM  Advanced Heart Failure Team Pager [phone] (M-F; 7a - 4p)  Please contact [facility] for night-coverage after hours (4p -7a ) and weekends on [email or web address]

## 2014-08-03 NOTE — Progress Notes (Signed)
Pharmacy Re: Levaquin  Due to pt's body weight = 46 kg, her estimated CrCl is ~ 34 ml/min.  Will adjust Levaquin dosing to q 48 hrs.  Tad Moore, BCPS  Clinical Pharmacist Pager [phone]  08/03/2014 7:18 AM

## 2014-08-03 NOTE — Progress Notes (Signed)
   08/03/14 1419  Clinical Encounter Type  Visited With Patient and family together  Visit Type Spiritual support  Stress Factors  Patient Stress Factors Other (Comment) (Pain)  Family Stress Factors Lack of knowledge  Chaplain responded to spiritual care consult that pt requested prayer. Pt family at bedside including pt sister. Sister immediately asked chaplain "how is she doing?" Chaplain stepped out and asked nurse to come speak with family since they wanted an update. When chaplain returned from speaking to nurse, pt was crying. Pt sister said that pt was was crying due to pain. Pt and family declined further chaplain support. Will follow as needed.  Wille Glaser 08/03/2014 2:21 PM

## 2014-08-03 NOTE — Progress Notes (Signed)
UR completed Eleftheria Taborn K. Carmisha Larusso, RN, BSN, MSHL, CCM  08/03/2014 11:53 AM

## 2014-08-03 NOTE — Progress Notes (Signed)
[facility] PROGRESS NOTE Interim History: 69 year old female with past medical history of severe COPD with ongoing tobacco abuse on home oxygen, chronic systolic heart failure with an EF of 20%, chronic NEC stage III, diabetes mellitus, recently discharged from the hospital on 06/23/2014 for acute decompensated heart failure that comes in volume overloaded. She was started on IV Lasix, continue on lisinopril and Coreg. Discharge weight was 55.5 kg on 12.18.2016,  Filed Weights   08/01/14 0519 08/02/14 0511 08/03/14 0521  Weight: 50.993 kg (112 lb 6.7 oz) 50.18 kg (110 lb 10 oz) 45.995 kg (101 lb 6.4 oz)        Intake/Output Summary (Last 24 hours) at 08/03/14 1610 Last data filed at 08/03/14 0800  Gross per 24 hour  Intake   1120 ml  Output   1425 ml  Net   -305 ml     Assessment/Plan: Acute hypoxic respiratory failure due to Acute on chronic systolic heart failure: - Echocardiogram on 06/21/2014 showed an ejection fraction of 20% with diffuse hypokinesis. - Patient was started on IV diuretics and her weight has continued to improve. - Continue on Coreg and lisinopril. Not a candidate for digoxin and spironolactone due to noncompliance. - Still vol overloaded. Will d/w diuretic therapy with heart failure team. - Appreciate advanced heart failure team assistance. - will have to talk about PMT.  COPD Has acute bronchitis and requiring almost 5 L o2 via [place]. Continue daily prednisone and nebs.   Chronic kidney disease stage III Renal function at baseline. Monitor with diuresis  Type 2 diabetes mellitus Monitor on sliding scale insulin  Tobacco abuse Counseled on smoking cessation. Ordered nicotine patch.  Hypokalemia Replenished    HTN Continue home medications  Protein calorie malnutrition Nutrition consult     Code Status: full Family Communication: none  Disposition Plan: inpatient   Consultants:  Cardiology advance HF  team.  Procedures: ECHO: none  Antibiotics:  levaquin (indicate start date, and stop date if known)  HPI/Subjective: SOB improved.  Objective: Filed Vitals:   08/02/14 2014 08/02/14 2131 08/03/14 0521 08/03/14 0828  BP: 108/73  108/64 110/70  Pulse: 83  63 92  Temp: 97.4 F (36.3 C)  98.3 F (36.8 C)   TempSrc: Oral  Oral   Resp: 16  16   Height:      Weight:   45.995 kg (101 lb 6.4 oz)   SpO2: 96% 92% 92%      Exam:  General: Alert, awake, oriented x3, in no acute distress.  HEENT: No bruits, no goiter. =JVD Heart: Regular rate and rhythm. Lungs: Good air movement, crackles on the right side. Abdomen: Soft, nontender, nondistended, positive bowel sounds.     Data Reviewed: Basic Metabolic Panel:  Recent Labs Lab 07/30/14 0129 07/30/14 0544 07/31/14 0400 08/01/14 0402 08/02/14 0326  NA 142 141 141 139 140  K 3.8 3.4* 4.0 4.1 4.5  CL 105 105 106 105 102  CO2 GLUCOSE 158* 177* 85 100* 85  BUN 32* 30* 34* 32* 36*  CREATININE 1.42* 1.35* 1.32* 1.15* 1.11*  CALCIUM 8.5 8.7 8.7 8.7 8.7   Liver Function Tests:  Recent Labs Lab 07/30/14 0544  AST 53*  ALT 28  ALKPHOS 191*  BILITOT 0.6  PROT 6.6  ALBUMIN 3.3*   No results for input(s): LIPASE, AMYLASE in the last 168 hours. No results for input(s): AMMONIA in the last 168 hours. CBC:  Recent Labs Lab 07/30/14 0129 07/30/14  0544 08/02/14 1000  WBC 7.7 8.9 11.6*  NEUTROABS  --  8.2*  --   HGB 12.2 12.0 12.4  HCT 40.5 40.0 41.3  MCV 93.1 92.8 93.4  PLT 184 179 238   Cardiac Enzymes:  Recent Labs Lab 07/30/14 0544 07/30/14 1300 07/30/14 1850  TROPONINI 0.03 0.03 0.03   BNP (last 3 results)  Recent Labs  06/21/14 0229  PROBNP 28259.0*   CBG:  Recent Labs Lab 08/02/14 0552 08/02/14 1055 08/02/14 1605 08/02/14 2110 08/03/14 0621  GLUCAP 95 122* 208* 352* 95    No results found for this or any previous visit (from the past 240 hour(s)).   Studies: Dg  Chest 2 View  08/02/2014   CLINICAL DATA:  Dyspnea.  EXAM: CHEST  2 VIEW  COMPARISON:  07/31/2014  FINDINGS: Exam demonstrates adequate lung volumes with mild stable interstitial prominence over the lung bases. Interval improvement in the previously mild vascular congestion. No focal consolidation or effusion. There is moderate stable cardiomegaly. There is moderate calcified plaque over the aortic arch.  IMPRESSION: Interval improvement in previously seen mild vascular congestion.  Stable moderate cardiomegaly.  Stable mild bibasilar interstitial changes.   Electronically Signed   By: Elberta Fortis M.D.   On: 08/02/2014 12:59    Scheduled Meds: . antiseptic oral rinse  7 mL Mouth Rinse BID  . budesonide (PULMICORT) nebulizer solution  0.25 mg Nebulization BID  . carvedilol  12.5 mg Oral BID WC  . enoxaparin (LOVENOX) injection  40 mg Subcutaneous Q24H  . ergocalciferol  50,000 Units Oral Weekly  . feeding supplement (ENSURE COMPLETE)  237 mL Oral TID BM  . gabapentin  300 mg Oral TID  . insulin aspart  0-9 Units Subcutaneous TID WC  . ipratropium-albuterol  3 mL Nebulization TID  . [START ON 08/04/2014] levofloxacin (LEVAQUIN) IV  750 mg Intravenous Q48H  . lisinopril  5 mg Oral Daily  . mirtazapine  30 mg Oral QHS  . nicotine  14 mg Transdermal Q24H  . potassium chloride  20 mEq Oral BID  . predniSONE  20 mg Oral Q breakfast  . rosuvastatin  40 mg Oral Daily  . senna-docusate  1 tablet Oral BID  . sodium chloride  3 mL Intravenous Q12H  . torsemide  20 mg Oral BID   Continuous Infusions:    Marinda Elk  [facility] Pager [phone] 08/03/2014, 9:06 AM  LOS: 4 days

## 2014-08-03 NOTE — Progress Notes (Signed)
Physical Therapy Treatment Patient Details Name: [name] [name] MRN: [id number] DOB: [birth year] Today's Date: 08/03/2014    History of Present Illness 69 y.o. female admitted to [facility] on 07/30/14 for SOB.  Per ED notes, pt reports running out of her O2 at home.  Pt dx with acute respiratory failure with decompensated CHF, acute bronchitis in the setting of COPD, and CKD III.  Pt with significant PMHx of DM, HTN, immune deficiency disorder, and arthritis (legs and shoulders).     PT Comments    Patient tolerated ambulation well, able to perform functional tasks in room for hygiene and transfers to/from toilet. Patient ambulated increased distance on 4 liters O2, desaturated to low 80s with ambulation, required increased standing rest break and cues for breathing to assist with rebound, increased O2 briefly to 6 liters to rebound above 90%. During session performed assessment of RLE per request of nursing secondary to reported pain. Patient pain free at this time and could not provoke or reproduce pain with testing, positioning, or palpation. Will continue to see and progress as tolerated.   Follow Up Recommendations  No PT follow up     Equipment Recommendations  None recommended by PT    Recommendations for Other Services       Precautions / Restrictions Precautions Precautions: Other (comment) Precaution Comments: Monitor O2 sats during gait.     Mobility  Bed Mobility Overal bed mobility: Needs Assistance Bed Mobility: Rolling;Supine to Sit;Sit to Supine Rolling: Supervision   Supine to sit: Min assist Sit to supine: Supervision      Transfers Overall transfer level: Needs assistance Equipment used: Rolling walker (2 wheeled) Transfers: Sit to/from Stand Sit to Stand: Supervision         General transfer comment: supervision for safety due to reliance on hands for balance during transitions  Ambulation/Gait Ambulation/Gait assistance: Supervision;Min  guard Ambulation Distance (Feet): 220 Feet Assistive device: Rolling walker (2 wheeled) Gait Pattern/deviations: Step-through pattern;Trunk flexed Gait velocity: decreased Gait velocity interpretation: Below normal speed for age/gender General Gait Details: two standing rest break to assess vitals during ambulation, desaturation to 83% on 4 liters, cued for pursed lip breathing and approximately 2 minute standing rest to rebound to 88% before continuing, increased to 6 liters briefly with rebound to 90% to complete ambulation   Stairs            Wheelchair Mobility    Modified Rankin (Stroke Patients Only)       Balance   Sitting-balance support: Feet supported Sitting balance-Leahy Scale: Good     Standing balance support: Bilateral upper extremity supported Standing balance-Leahy Scale: Good                      Cognition Arousal/Alertness: Awake/alert Behavior During Therapy: Flat affect Overall Cognitive Status: No family/caregiver present to determine baseline cognitive functioning (also difficult to assess due to education level)                      Exercises      General Comments General comments (skin integrity, edema, etc.): assessment of RLE secondary to nursing reported pain and inquiry, attempted various positioning, palpation tests and strength assessment, could not reproduce pain, patient reports her leg is feeling better a tthis time.      Pertinent Vitals/Pain Pain Assessment: No/denies pain Faces Pain Scale: No hurt    Home Living  Prior Function            PT Goals (current goals can now be found in the care plan section) Acute Rehab PT Goals Patient Stated Goal: to find her ID and go home PT Goal Formulation: With patient Time For Goal Achievement: 08/15/14 Potential to Achieve Goals: Good Progress towards PT goals: Progressing toward goals    Frequency  Min 3X/week    PT Plan Current  plan remains appropriate    Co-evaluation             End of Session Equipment Utilized During Treatment: Gait belt;Oxygen Activity Tolerance: Patient limited by fatigue;Treatment limited secondary to medical complications (Comment) (limited by DOE) Patient left: in bed;with call bell/phone within reach (bed alarm could not be set, ERRor code)     Time: 1610-9604 PT Time Calculation (min) (ACUTE ONLY): 26 min  Charges:  $Gait Training: 8-22 mins $Therapeutic Activity: 8-22 mins                    G CodesFabio Asa 2014-08-30, 4:27 PM Charlotte Crumb, PT DPT  [phone]

## 2014-08-04 DIAGNOSIS — Z515 Encounter for palliative care: Secondary | ICD-10-CM

## 2014-08-04 DIAGNOSIS — J439 Emphysema, unspecified: Secondary | ICD-10-CM

## 2014-08-04 DIAGNOSIS — R531 Weakness: Secondary | ICD-10-CM

## 2014-08-04 DIAGNOSIS — R0600 Dyspnea, unspecified: Secondary | ICD-10-CM

## 2014-08-04 DIAGNOSIS — I509 Heart failure, unspecified: Secondary | ICD-10-CM

## 2014-08-04 DIAGNOSIS — Z7189 Other specified counseling: Secondary | ICD-10-CM

## 2014-08-04 LAB — GLUCOSE, CAPILLARY
GLUCOSE-CAPILLARY: 238 mg/dL — AB (ref 70–99)
Glucose-Capillary: 103 mg/dL — ABNORMAL HIGH (ref 70–99)
Glucose-Capillary: 259 mg/dL — ABNORMAL HIGH (ref 70–99)
Glucose-Capillary: 313 mg/dL — ABNORMAL HIGH (ref 70–99)

## 2014-08-04 LAB — BASIC METABOLIC PANEL
Anion gap: 11 (ref 5–15)
BUN: 43 mg/dL — ABNORMAL HIGH (ref 6–23)
CO2: 36 mmol/L — ABNORMAL HIGH (ref 19–32)
CREATININE: 1.28 mg/dL — AB (ref 0.50–1.10)
Calcium: 9.2 mg/dL (ref 8.4–10.5)
Chloride: 95 mEq/L — ABNORMAL LOW (ref 96–112)
GFR calc Af Amer: 48 mL/min — ABNORMAL LOW (ref >90)
GFR, EST NON AFRICAN AMERICAN: 41 mL/min — AB (ref >90)
GLUCOSE: 153 mg/dL — AB (ref 70–99)
Potassium: 4.7 mmol/L (ref 3.5–5.1)
Sodium: 142 mmol/L (ref 135–145)

## 2014-08-04 MED ORDER — LEVOFLOXACIN 500 MG PO TABS
500.0000 mg | ORAL_TABLET | Freq: Every day | ORAL | Status: DC
  Administered 2014-08-04 – 2014-08-05 (×2): 500 mg via ORAL
  Filled 2014-08-04 (×2): qty 1

## 2014-08-04 NOTE — Consult Note (Signed)
Patient [name] [name]      DOB: Apr 11, 1946      MWN:[id number]     Consult Note from the Palliative Medicine Team at [facility]    Consult Requested by: Dr Radonna Ricker     PCP: Jackie Plum, MD Reason for Consultation:Clarification of GOC and options     Phone [phone]  Assessment of patients Current state:  Continued physical and functional decline 2/2 to multiple co-morbidities ( COPD, CHF, CKD, DM)  Faced with advanced directive decisions and anticipatory care needs.  Family and patient have poor prognostic awareness and limited understanding of disease processes and effective medical interventions.  Consult is for review of medical treatment options, clarification of goals of care and end of life issues, disposition and options, and symptom recommendation.  This NP Lorinda Creed reviewed medical records, received report from team, assessed the patient and then meet at the patient's bedside along with her daughter, SO and several other family members  to discuss diagnosis prognosis, GOC, EOL wishes disposition and options.  A detailed discussion was had today regarding advanced directives.  Concepts specific to code status, artifical feeding and hydration, continued IV antibiotics and rehospitalization was had.  The difference between a aggressive medical intervention path  and a palliative comfort care path for this patient at this time was had.  Values and goals of care important to patient and family were attempted to be elicited.  Concept of Hospice and Palliative Care were discussed  Natural trajectory and expectations at EOL were discussed.  Questions and concerns addressed.  Hard Choices booklet left for review. Family encouraged to call with questions or concerns.  PMT will continue to support holistically.   Goals of Care: 1.  Code Status: Full code   2. Scope of Treatment:  Patient is open to all offered and available medical interventions to prolong life.   She is hopeful for continued quality of life. We discussed the importance of addressing advanced directives and continued conversation with her daughter and family in order to enhance patient centered care   3. Disposition:  Home (lives with boyfriend), daughter is her main support person and lives nearby.  It sounds like patient has several hours a week from [facility] services   4. Symptom Management:   1. Weakness:  Medical management of chronic disease  5. Psychosocial:  Emotional support offered to patient  and her daughter. Education offered regarding the importance of continued conversation and documentation of advanced care planning in order to enhance patient centered care.  6. Spiritual:  Chaplain consulted     Patient Documents Completed or Given: Document Given Completed  Advanced Directives Pkt    MOST X   DNR    Gone from My Sight    Hard Choices X     Brief HPI: 69 yo female admitted on 07-30-14 for SOB, lives in [place] but prefers [facility] at [facility].  PMH of DM, COPD(continues to smoke), HTN, CKD, autoimmune disorder.  Self reported weight loss and increased weakness and fatigue.  Admitted for stablization  ROS: weakness, increased shortness of breath with minimal exertion   PMH:  Past Medical History  Diagnosis Date  . Immune deficiency disorder   . Hypertension   . CHF (congestive heart failure)   . Shortness of breath dyspnea   . High cholesterol   . Pneumonia     "back when I was little"  . Chronic kidney disease (CKD), stage III (moderate)     [name] 08/01/2014  .  On home oxygen therapy     "2-3L; 24/7" (08/01/2014)  . Type II diabetes mellitus   . Chronic systolic CHF (congestive heart failure)     [name] 08/01/2014  . COPD (chronic obstructive pulmonary disease)     "severe" /notes 08/01/2014  . Arthritis     legs, shoulders (08/01/2014)     PSH: Past Surgical History  Procedure Laterality Date  . Breast surgery Right     "probably was a  boil"   I have reviewed the FH and SH and  If appropriate update it with new information. No Known Allergies Scheduled Meds: . antiseptic oral rinse  7 mL Mouth Rinse BID  . budesonide (PULMICORT) nebulizer solution  0.25 mg Nebulization BID  . carvedilol  6.25 mg Oral BID WC  . enoxaparin (LOVENOX) injection  40 mg Subcutaneous Q24H  . ergocalciferol  50,000 Units Oral Weekly  . feeding supplement (ENSURE COMPLETE)  237 mL Oral TID BM  . furosemide  80 mg Intravenous BID  . gabapentin  300 mg Oral TID  . insulin aspart  0-9 Units Subcutaneous TID WC  . ipratropium-albuterol  3 mL Nebulization TID  . levofloxacin  500 mg Oral Daily  . lisinopril  5 mg Oral Daily  . mirtazapine  30 mg Oral QHS  . nicotine  14 mg Transdermal Q24H  . potassium chloride  20 mEq Oral BID  . predniSONE  20 mg Oral Q breakfast  . rosuvastatin  40 mg Oral Daily  . senna-docusate  1 tablet Oral BID  . sodium chloride  3 mL Intravenous Q12H   Continuous Infusions:  PRN Meds:.acetaminophen **OR** acetaminophen, albuterol, bisacodyl, ondansetron **OR** ondansetron (ZOFRAN) IV    BP 107/75 mmHg  Pulse 64  Temp(Src) 97.9 F (36.6 C) (Oral)  Resp 18  Ht  (1.6 m)  Wt 45.6 kg (100 lb 8.5 oz)  BMI 17.81 kg/m2  SpO2 100%      Intake/Output Summary (Last 24 hours) at 08/04/14 0948 Last data filed at 08/04/14 0920  Gross per 24 hour  Intake    960 ml  Output   1851 ml  Net   -891 ml   Physical Exam:  General: chronically ill appearing, + temporal muscle wasting HEENT:  Moist buccal membranes, no exudate Chest: decreased in bases CVS: RRR Abdomen: soft NT +BS  Labs: CBC    Component Value Date/Time   WBC 11.6* 08/02/2014 1000   RBC 4.42 08/02/2014 1000   HGB 12.4 08/02/2014 1000   HCT 41.3 08/02/2014 1000   PLT 238 08/02/2014 1000   MCV 93.4 08/02/2014 1000   MCH 28.1 08/02/2014 1000   MCHC 30.0 08/02/2014 1000   RDW 19.3* 08/02/2014 1000   LYMPHSABS 0.4* 07/30/2014 0544    MONOABS 0.3 07/30/2014 0544   EOSABS 0.0 07/30/2014 0544   BASOSABS 0.0 07/30/2014 0544    BMET    Component Value Date/Time   NA 142 08/04/2014 0434   K 4.7 08/04/2014 0434   CL 95* 08/04/2014 0434   CO2 36* 08/04/2014 0434   GLUCOSE 153* 08/04/2014 0434   BUN 43* 08/04/2014 0434   CREATININE 1.28* 08/04/2014 0434   CALCIUM 9.2 08/04/2014 0434   GFRNONAA 41* 08/04/2014 0434   GFRAA 48* 08/04/2014 0434    CMP     Component Value Date/Time   NA 142 08/04/2014 0434   K 4.7 08/04/2014 0434   CL 95* 08/04/2014 0434   CO2 36* 08/04/2014 0434   GLUCOSE 153*  08/04/2014 0434   BUN 43* 08/04/2014 0434   CREATININE 1.28* 08/04/2014 0434   CALCIUM 9.2 08/04/2014 0434   PROT 6.6 07/30/2014 0544   ALBUMIN 3.3* 07/30/2014 0544   AST 53* 07/30/2014 0544   ALT 28 07/30/2014 0544   ALKPHOS 191* 07/30/2014 0544   BILITOT 0.6 07/30/2014 0544   GFRNONAA 41* 08/04/2014 0434   GFRAA 48* 08/04/2014 0434     Time In Time Out Total Time Spent with Patient Total Overall Time  1500 1430 80 min 90 min    Greater than 50%  of this time was spent counseling and coordinating care related to the above assessment and plan.   Lorinda Creed NP  Palliative Medicine Team Team Phone # [phone] Pager [phone]

## 2014-08-04 NOTE — Progress Notes (Signed)
Advanced Heart Failure Rounding Note   Subjective:      [name] [name] is a 69 y.o. female with history of severe COPD with ongoing tobacco use (on home O2) and chronic systolic heart failure with EF 20%, chronic kidney disease stage III, and diabetes mellitus. She has received most of her cardiac care in [facility].  Admitted to [facility] 12/8 through 06/23/14 with increased dyspnea. Diuresed with IV lasix and transitioned to PO lasix. Also discharged on carvedilol 12.5 mg twice a day, lisinopril 5 mg daily, and lasix 40 mg daily. Discharge weight 122 pounds.   Admitted with volume overload. Started on IV lasix. Yesterday she was diuresed with IV lasix and carvedilol was cut back 6.25 mg twice a day. Weight down another pound. Overall weight down 13 pounds. Denies SOB/Orthopnea.   Palliative Care consult pending.     2D Echo 06/21/2014 showing LVEF of 20-25%   Objective:   Weight Range:  Vital Signs:   Temp:  [97.2 F (36.2 C)-98.2 F (36.8 C)] 97.9 F (36.6 C) (01/21 0452) Pulse Rate:  [64-87] 84 (01/21 1037) Resp:  [18-20] 18 (01/21 0452) BP: (102-111)/(64-81) 111/69 mmHg (01/21 1037) SpO2:  [94 %-100 %] 100 % (01/21 0452) Weight:  [100 lb 8.5 oz (45.6 kg)] 100 lb 8.5 oz (45.6 kg) (01/21 0452) Last BM Date: 08/02/14  Weight change: Filed Weights   08/02/14 0511 08/03/14 0521 08/04/14 0452  Weight: 110 lb 10 oz (50.18 kg) 101 lb 6.4 oz (45.995 kg) 100 lb 8.5 oz (45.6 kg)    Intake/Output:   Intake/Output Summary (Last 24 hours) at 08/04/14 1250 Last data filed at 08/04/14 1045  Gross per 24 hour  Intake   1197 ml  Output   2575 ml  Net  -1378 ml     Physical Exam: General: Elderly. Frail Chronically ill appearing. NAD.  HEENT: normal Neck: supple. JVP 6-7 . Carotids 2+ bilat; no bruits. No lymphadenopathy or thryomegaly appreciated. Cor: PMI laterally displaced. Regular rate & rhythm. No rubs, or murmurs. + S3  Lungs: crackles at bases Abdomen: soft, nontender,  nondistended. No hepatosplenomegaly. No bruits or masses. Good bowel sounds. Extremities: no cyanosis, clubbing, rash, edema Neuro: alert & orientedx3, cranial nerves grossly intact. moves all 4 extremities w/o difficulty. Affect pleasant  Telemetry: NSR   Labs: Basic Metabolic Panel:  Recent Labs Lab 07/30/14 0544 07/31/14 0400 08/01/14 0402 08/02/14 0326 08/04/14 0434  NA 141 141 139 140 142  K 3.4* 4.0 4.1 4.5 4.7  CL 105 106 105 102 95*  CO2 36*  GLUCOSE 177* 85 100* 85 153*  BUN 30* 34* 32* 36* 43*  CREATININE 1.35* 1.32* 1.15* 1.11* 1.28*  CALCIUM 8.7 8.7 8.7 8.7 9.2    Liver Function Tests:  Recent Labs Lab 07/30/14 0544  AST 53*  ALT 28  ALKPHOS 191*  BILITOT 0.6  PROT 6.6  ALBUMIN 3.3*   No results for input(s): LIPASE, AMYLASE in the last 168 hours. No results for input(s): AMMONIA in the last 168 hours.  CBC:  Recent Labs Lab 07/30/14 0129 07/30/14 0544 08/02/14 1000  WBC 7.7 8.9 11.6*  NEUTROABS  --  8.2*  --   HGB 12.2 12.0 12.4  HCT 40.5 40.0 41.3  MCV 93.1 92.8 93.4  PLT 184 179 238    Cardiac Enzymes:  Recent Labs Lab 07/30/14 0544 07/30/14 1300 07/30/14 1850  TROPONINI 0.03 0.03 0.03    BNP: BNP (last 3 results)  Recent Labs  06/21/14 0229  PROBNP 28259.0*     Other results:    Imaging: No results found.   Medications:     Scheduled Medications: . antiseptic oral rinse  7 mL Mouth Rinse BID  . budesonide (PULMICORT) nebulizer solution  0.25 mg Nebulization BID  . carvedilol  6.25 mg Oral BID WC  . enoxaparin (LOVENOX) injection  40 mg Subcutaneous Q24H  . ergocalciferol  50,000 Units Oral Weekly  . feeding supplement (ENSURE COMPLETE)  237 mL Oral TID BM  . furosemide  80 mg Intravenous BID  . gabapentin  300 mg Oral TID  . insulin aspart  0-9 Units Subcutaneous TID WC  . ipratropium-albuterol  3 mL Nebulization TID  . levofloxacin  500 mg Oral Daily  . lisinopril  5 mg Oral Daily  .  mirtazapine  30 mg Oral QHS  . nicotine  14 mg Transdermal Q24H  . potassium chloride  20 mEq Oral BID  . predniSONE  20 mg Oral Q breakfast  . rosuvastatin  40 mg Oral Daily  . senna-docusate  1 tablet Oral BID  . sodium chloride  3 mL Intravenous Q12H    Infusions:    PRN Medications: acetaminophen **OR** acetaminophen, albuterol, bisacodyl, ondansetron **OR** ondansetron (ZOFRAN) IV   Assessment:   1. A/C Systolic HF ECHO 30/0923  EF 20%  2. A/c hypoxic respiratory failure 3. Severe COPD 4. CKD Stage III 5. DM Type 2 6. Ongoing tobacco use 7. Noncomplianc    Plan/Discussion:    Appears euvoleumic. Stop IV lasix and transition to po lasix 40 mg twice a day.  Continue carvedilol 6.25 mg twice a day and lisinopril 5 mg daily. Would not place on spiro or dig due to noncompliance.   Will need to get records to see if she had ischemic evaluation in past. Has not had CP and not CABG candidate so utility of cath lower at this point.   Palliative Care Consult pending.   Will refer to paramedicine and HF SW.    Length of Stay: 5   CLEGG,AMY NP-C  08/04/2014, 12:50 PM  Advanced Heart Failure Team Pager [phone] (M-F; 7a - 4p)  Please contact [facility] for night-coverage after hours (4p -7a ) and weekends on [email or web address]  Patient seen and examined with Tonye Becket, NP. We discussed all aspects of the encounter. I agree with the assessment and plan as stated above.   Weight way down. Agree with switching to po lasix. Carvedilol cut back yesterday. Agree with Palliative Care consult.  Truman Hayward 5:51 PM

## 2014-08-04 NOTE — Progress Notes (Signed)
[facility] PROGRESS NOTE Interim History: 69 year old female with past medical history of severe COPD with ongoing tobacco abuse on home oxygen, chronic systolic heart failure with an EF of 20%, chronic NEC stage III, diabetes mellitus, recently discharged from the hospital on 06/23/2014 for acute decompensated heart failure that comes in volume overloaded. She was started on IV Lasix, continue on lisinopril and Coreg. Discharge weight was 55.5 kg on 12.18.2016.  Filed Weights   08/02/14 0511 08/03/14 0521 08/04/14 0452  Weight: 50.18 kg (110 lb 10 oz) 45.995 kg (101 lb 6.4 oz) 45.6 kg (100 lb 8.5 oz)        Intake/Output Summary (Last 24 hours) at 08/04/14 0810 Last data filed at 08/04/14 0455  Gross per 24 hour  Intake    480 ml  Output   1851 ml  Net  -1371 ml     Assessment/Plan: Acute hypoxic respiratory failure due to Acute on chronic systolic heart failure: - Echocardiogram on 06/21/2014 showed an ejection fraction of 20% with diffuse hypokinesis. - Cont on IV diuretics, no weight this am. - Pt on a lower dose of coreg. - Had good conversion with pt. She is willing to meet with PMT.  COPD: - Has acute bronchitis and requiring almost 5 L O2 via [place]. Continue daily prednisone and nebs.   Chronic kidney disease stage III - Renal function at baseline. Monitor with diuresis  Type 2 diabetes mellitus - Monitor on sliding scale insulin  Tobacco abuse - Counseled on smoking cessation. Ordered nicotine patch.  Hypokalemia - Replenished  HTN - Continue home medications  Protein calorie malnutrition Nutrition consult     Code Status: full Family Communication: none  Disposition Plan: inpatient   Consultants:  Cardiology advance HF team.  Procedures: ECHO: none  Antibiotics:  levaquin  HPI/Subjective: SOB cont to improve.  Objective: Filed Vitals:   08/03/14 1800 08/03/14 2013 08/03/14 2117 08/04/14 0452  BP: 104/64 102/65  107/75  Pulse:  75 87  64  Temp: 98.2 F (36.8 C) 97.8 F (36.6 C)  97.9 F (36.6 C)  TempSrc: Oral Oral  Oral  Resp: 20 20  18   Height:      Weight:    45.6 kg (100 lb 8.5 oz)  SpO2: 94% 96% 95% 100%     Exam:  General: Alert, awake, oriented x3, in no acute distress.  HEENT: No bruits, no goiter. + JVD Heart: Regular rate and rhythm. Lungs: Good air movement, crackles on the right side. Abdomen: Soft, nontender, nondistended, positive bowel sounds.     Data Reviewed: Basic Metabolic Panel:  Recent Labs Lab 07/30/14 0544 07/31/14 0400 08/01/14 0402 08/02/14 0326 08/04/14 0434  NA 141 141 139 140 142  K 3.4* 4.0 4.1 4.5 4.7  CL 105 106 105 102 95*  CO2 28 27 30 28  36*  GLUCOSE 177* 85 100* 85 153*  BUN 30* 34* 32* 36* 43*  CREATININE 1.35* 1.32* 1.15* 1.11* 1.28*  CALCIUM 8.7 8.7 8.7 8.7 9.2   Liver Function Tests:  Recent Labs Lab 07/30/14 0544  AST 53*  ALT 28  ALKPHOS 191*  BILITOT 0.6  PROT 6.6  ALBUMIN 3.3*   No results for input(s): LIPASE, AMYLASE in the last 168 hours. No results for input(s): AMMONIA in the last 168 hours. CBC:  Recent Labs Lab 07/30/14 0129 07/30/14 0544 08/02/14 1000  WBC 7.7 8.9 11.6*  NEUTROABS  --  8.2*  --   HGB 12.2 12.0 12.4  HCT  40.5 40.0 41.3  MCV 93.1 92.8 93.4  PLT 184 179 238   Cardiac Enzymes:  Recent Labs Lab 07/30/14 0544 07/30/14 1300 07/30/14 1850  TROPONINI 0.03 0.03 0.03   BNP (last 3 results)  Recent Labs  06/21/14 0229  PROBNP 28259.0*   CBG:  Recent Labs Lab 08/03/14 0621 08/03/14 1145 08/03/14 1656 08/03/14 2212 08/04/14 0625  GLUCAP 95 280* 259* 108* 103*    No results found for this or any previous visit (from the past 240 hour(s)).   Studies: Dg Chest 2 View  08/02/2014   CLINICAL DATA:  Dyspnea.  EXAM: CHEST  2 VIEW  COMPARISON:  07/31/2014  FINDINGS: Exam demonstrates adequate lung volumes with mild stable interstitial prominence over the lung bases. Interval improvement in the  previously mild vascular congestion. No focal consolidation or effusion. There is moderate stable cardiomegaly. There is moderate calcified plaque over the aortic arch.  IMPRESSION: Interval improvement in previously seen mild vascular congestion.  Stable moderate cardiomegaly.  Stable mild bibasilar interstitial changes.   Electronically Signed   By: Elberta Fortis M.D.   On: 08/02/2014 12:59    Scheduled Meds: . antiseptic oral rinse  7 mL Mouth Rinse BID  . budesonide (PULMICORT) nebulizer solution  0.25 mg Nebulization BID  . carvedilol  6.25 mg Oral BID WC  . enoxaparin (LOVENOX) injection  40 mg Subcutaneous Q24H  . ergocalciferol  50,000 Units Oral Weekly  . feeding supplement (ENSURE COMPLETE)  237 mL Oral TID BM  . furosemide  80 mg Intravenous BID  . gabapentin  300 mg Oral TID  . insulin aspart  0-9 Units Subcutaneous TID WC  . ipratropium-albuterol  3 mL Nebulization TID  . levofloxacin (LEVAQUIN) IV  500 mg Intravenous Q48H  . lisinopril  5 mg Oral Daily  . mirtazapine  30 mg Oral QHS  . nicotine  14 mg Transdermal Q24H  . potassium chloride  20 mEq Oral BID  . predniSONE  20 mg Oral Q breakfast  . rosuvastatin  40 mg Oral Daily  . senna-docusate  1 tablet Oral BID  . sodium chloride  3 mL Intravenous Q12H   Continuous Infusions:    Marinda Elk  [facility] Pager [phone] 08/04/2014, 8:10 AM  LOS: 5 days

## 2014-08-04 NOTE — Progress Notes (Signed)
CARDIAC REHAB PHASE I   PRE:  Rate/Rhythm: 80 not on telemetry   BP:  Supine:   Sitting: 105/71  Standing:    SaO2: 92 4L  MODE:  Ambulation: 290 ft   POST:  Rate/Rhythm: 100  BP:  Supine:   Sitting: 116/80  Standing:    SaO2: 88 4L 1420-1455 Assisted X 1 used walker and O2 4L to ambulate. Gait steady with walker. O2 sat half way through 88-89% on 4L. Pt without c/o walking she denies any SOB. Pt back to bed after walk with call light in reach and many family members in room.  Melina Copa RN 08/04/2014 2:54 PM

## 2014-08-05 DIAGNOSIS — Z7189 Other specified counseling: Secondary | ICD-10-CM

## 2014-08-05 DIAGNOSIS — R0600 Dyspnea, unspecified: Secondary | ICD-10-CM | POA: Insufficient documentation

## 2014-08-05 DIAGNOSIS — I509 Heart failure, unspecified: Secondary | ICD-10-CM | POA: Insufficient documentation

## 2014-08-05 DIAGNOSIS — R531 Weakness: Secondary | ICD-10-CM

## 2014-08-05 LAB — BASIC METABOLIC PANEL
Anion gap: 11 (ref 5–15)
BUN: 58 mg/dL — ABNORMAL HIGH (ref 6–23)
CO2: 37 mmol/L — ABNORMAL HIGH (ref 19–32)
Calcium: 9.1 mg/dL (ref 8.4–10.5)
Chloride: 98 mEq/L (ref 96–112)
Creatinine, Ser: 1.77 mg/dL — ABNORMAL HIGH (ref 0.50–1.10)
GFR calc non Af Amer: 28 mL/min — ABNORMAL LOW (ref >90)
GFR, EST AFRICAN AMERICAN: 32 mL/min — AB (ref >90)
Glucose, Bld: 145 mg/dL — ABNORMAL HIGH (ref 70–99)
POTASSIUM: 5.8 mmol/L — AB (ref 3.5–5.1)
Sodium: 146 mmol/L — ABNORMAL HIGH (ref 135–145)

## 2014-08-05 LAB — POTASSIUM: POTASSIUM: 4.4 mmol/L (ref 3.5–5.1)

## 2014-08-05 LAB — GLUCOSE, CAPILLARY
GLUCOSE-CAPILLARY: 126 mg/dL — AB (ref 70–99)
Glucose-Capillary: 83 mg/dL (ref 70–99)

## 2014-08-05 MED ORDER — CARVEDILOL 6.25 MG PO TABS: 6.2500 mg | ORAL_TABLET | Freq: Two times a day (BID) | ORAL | Status: DC

## 2014-08-05 MED ORDER — FUROSEMIDE 40 MG PO TABS: 40.0000 mg | ORAL_TABLET | Freq: Two times a day (BID) | ORAL | Status: DC

## 2014-08-05 MED ORDER — POTASSIUM CHLORIDE CRYS ER 10 MEQ PO TBCR: 10.0000 meq | EXTENDED_RELEASE_TABLET | Freq: Two times a day (BID) | ORAL | Status: DC

## 2014-08-05 NOTE — Progress Notes (Signed)
Physical Therapy Treatment Patient Details Name: [name] [name] MRN: [id number] DOB: 1945/10/12 Today's Date: 08/05/2014    History of Present Illness 69 y.o. female admitted to [facility] on 07/30/14 for SOB.  Per ED notes, pt reports running out of her O2 at home.  Pt dx with acute respiratory failure with decompensated CHF, acute bronchitis in the setting of COPD, and CKD III.  Pt with significant PMHx of DM, HTN, immune deficiency disorder, and arthritis (legs and shoulders).     PT Comments    Patient progressing well with mobility, still demonstrates desaturation on 4 liters of )2 with activity. (4% at rest and 86% after ambulation and self care tasks. Will continue to see and progress as tolerated.  Follow Up Recommendations  No PT follow up     Equipment Recommendations  None recommended by PT    Recommendations for Other Services       Precautions / Restrictions Precautions Precautions: Other (comment) Precaution Comments: Monitor O2 sats during gait.     Mobility  Bed Mobility Overal bed mobility: Needs Assistance Bed Mobility: Rolling;Supine to Sit;Sit to Supine Rolling: Supervision   Supine to sit: Min assist Sit to supine: Supervision      Transfers Overall transfer level: Needs assistance Equipment used: Rolling walker (2 wheeled) Transfers: Sit to/from Stand Sit to Stand: Supervision         General transfer comment: one VC for hand placement upon standing  Ambulation/Gait Ambulation/Gait assistance: Supervision Ambulation Distance (Feet): 360 Feet Assistive device: Rolling walker (2 wheeled) Gait Pattern/deviations: Step-through pattern;Trunk flexed Gait velocity: decreased Gait velocity interpretation: Below normal speed for age/gender General Gait Details: Standing rest break to assess vitals, ambulating well, good stability.   Stairs            Wheelchair Mobility    Modified Rankin (Stroke Patients Only)       Balance    Sitting-balance support: Feet supported Sitting balance-Leahy Scale: Good     Standing balance support: Bilateral upper extremity supported Standing balance-Leahy Scale: Good                      Cognition Arousal/Alertness: Awake/alert Behavior During Therapy: Flat affect Overall Cognitive Status: No family/caregiver present to determine baseline cognitive functioning (also difficult to assess due to education level)                      Exercises      General Comments General comments (skin integrity, edema, etc.): patient able to perform daily tasks and grooming at counter height, with no UE supprot for breif periods of static standind      Pertinent Vitals/Pain Pain Assessment: No/denies pain Faces Pain Scale: No hurt    Home Living                      Prior Function            PT Goals (current goals can now be found in the care plan section) Acute Rehab PT Goals Patient Stated Goal: to find her ID and go home PT Goal Formulation: With patient Time For Goal Achievement: 08/15/14 Potential to Achieve Goals: Good Progress towards PT goals: Progressing toward goals    Frequency  Min 3X/week    PT Plan Current plan remains appropriate    Co-evaluation             End of Session Equipment Utilized During Treatment: Gait belt;Oxygen Activity Tolerance:  Patient tolerated treatment well Patient left: in bed;with call bell/phone within reach (bed alarm could not be set, ERRor code)     Time: 0705-0729 PT Time Calculation (min) (ACUTE ONLY): 24 min  Charges:  $Gait Training: 8-22 mins $Therapeutic Activity: 8-22 mins                    G CodesFabio Asa 08-11-2014, 9:22 AM Charlotte Crumb, PT DPT  [phone]

## 2014-08-05 NOTE — Progress Notes (Addendum)
Pt potassium 5.8 this AM. MD on call paged via amion textpage.  Per [name] [name], give AM dose of lasix now. Potassium to be re-checked in 4 hours. Will follow order.

## 2014-08-05 NOTE — Progress Notes (Signed)
[id number] Cardiac Rehab Pt had walked with PT this am. Completed CHF education with pt. I gave her CHF packet. We discussed low sodium diet, daily weights when to call MD and 911. She was not able to recall information I discussed with her. Pt lives with her boyfriend and her daughter and son check on them. Pt states that she always takes her medication and never misses it. We discussed smoking cessation, pt states that she is never going to smoke again, she was finished. I gave her tips for quitting, coaching contact number and quit smart class information. When I ask pt what she should do if she has weight gain, she states" I am not going to." Pt needs [facility] checking on her to make sure she is weighing herself daily and taking her medications. I doubt compliance with diet or fluid restrictions.

## 2014-08-05 NOTE — Progress Notes (Addendum)
Dischage instructions and prescriptions given to pt and son .Both verbalized understanding . Wheeled to lobby by NT O2 transport on

## 2014-08-05 NOTE — Discharge Summary (Signed)
Physician Discharge Summary  [name] [name] ZOX:[id number] DOB: 04-18-1946 DOA: 07/30/2014  PCP: Jackie Plum, MD  Admit date: 07/30/2014 Discharge date: 08/05/2014  Time spent: 50 minutes  Recommendations for Outpatient Follow-up:  -Will be discharged home today.  -Advised to follow up with his PCP in 1 week and with the heart failure clinic as scheduled by them.  Discharge Diagnoses:  Active Problems:   COPD (chronic obstructive pulmonary disease)   Hypertension   Tobacco abuse   CHF (congestive heart failure)   CKD (chronic kidney disease) stage 3, GFR 30-59 ml/min   Acute on chronic systolic heart failure   Acute on chronic respiratory failure with hypoxia   DNR (do not resuscitate) discussion   Weakness generalized   Congestive heart disease   Dyspnea   Discharge Condition: Stable and improved  Filed Weights   08/03/14 0521 08/04/14 0452 08/05/14 0509  Weight: 45.995 kg (101 lb 6.4 oz) 45.6 kg (100 lb 8.5 oz) 45 kg (99 lb 3.3 oz)    History of present illness:  [name] [name] is a 69 y.o. female history of chronic systolic heart failure last year measured in December 2015 was 20%, COPD, ongoing tobacco abuse, chronic kidney disease stage III, diabetes mellitus not on medication presents to the ER because of shortness of breath. Patient stated that over the last 2-3 days patient has been increasingly short of breath and as per ER physician patient had ran out of her oxygen at home. In the ER patient was found to be short of breath and wheezing. Chest x-ray shows congestion. BNP is more than 4000. Patient was given Lasix 60 mg IV in the ER. Patient states she has been compliant with her medications. Patient will be admitted for decompensated CHF. Patient denies any chest pain. Chills abdominal pain nausea vomiting or diarrhea.   Hospital Course:   Acute hypoxic respiratory failure due to Acute on chronic systolic heart failure: - Echocardiogram on 06/21/2014  showed an ejection fraction of 20% with diffuse hypokinesis. - has diuresed 6+L since admission, appears euvolemic today. -Lasix has been transitioned to PO. - Pt on a lower dose of coreg than his typical home dose. Started on lisinopril. - Per meeting with palliative care, patient would like to continue all medical treatments available to her  COPD: -Seems to be doing well. -No wheezing on exam. -Continue nebs as an OP.  Chronic kidney disease stage III - Renal function at baseline. Monitor with diuresis  Type 2 diabetes mellitus - Well controlled  Tobacco abuse - Counseled on smoking cessation. Ordered nicotine patch.  Hypokalemia - Replenished. -Will be discharged on K supplementation.  HTN - Continue home medications  Protein calorie malnutrition -Nutritional supplements.   Procedures:  None   Consultations:  None  Discharge Instructions  Discharge Instructions    Diet - low sodium heart healthy    Complete by:  As directed      Increase activity slowly    Complete by:  As directed             Medication List    STOP taking these medications        torsemide 20 MG tablet  Commonly known as:  DEMADEX      TAKE these medications        carvedilol 6.25 MG tablet  Commonly known as:  COREG  Take 1 tablet (6.25 mg total) by mouth 2 (two) times daily with a meal.     ferrous sulfate 325 (65  FE) MG EC tablet  Take 325 mg by mouth daily with breakfast.     furosemide 40 MG tablet  Commonly known as:  LASIX  Take 1 tablet (40 mg total) by mouth 2 (two) times daily.     lisinopril 5 MG tablet  Commonly known as:  PRINIVIL,ZESTRIL  Take 5 mg by mouth daily.     mirtazapine 30 MG tablet  Commonly known as:  REMERON  Take 30 mg by mouth at bedtime.     potassium chloride 10 MEQ tablet  Commonly known as:  K-DUR,KLOR-CON  Take 1 tablet (10 mEq total) by mouth 2 (two) times daily.     rosuvastatin 40 MG tablet  Commonly known as:  CRESTOR    Take 40 mg by mouth daily.       No Known Allergies     Follow-up Information    Follow up with [facility].   Why:  Registered Nurse Services to start within 24-48 hours of hospital discharge   Contact information:   [address] [place] [address] [phone]        The results of significant diagnostics from this hospitalization (including imaging, microbiology, ancillary and laboratory) are listed below for reference.    Significant Diagnostic Studies: Dg Chest 2 View  08/02/2014   CLINICAL DATA:  Dyspnea.  EXAM: CHEST  2 VIEW  COMPARISON:  07/31/2014  FINDINGS: Exam demonstrates adequate lung volumes with mild stable interstitial prominence over the lung bases. Interval improvement in the previously mild vascular congestion. No focal consolidation or effusion. There is moderate stable cardiomegaly. There is moderate calcified plaque over the aortic arch.  IMPRESSION: Interval improvement in previously seen mild vascular congestion.  Stable moderate cardiomegaly.  Stable mild bibasilar interstitial changes.   Electronically Signed   By: Elberta Fortis M.D.   On: 08/02/2014 12:59   Dg Chest 2 View  07/31/2014   CLINICAL DATA:  Chronic cough and shortness of breath that acutely worsened over the past several days and is associated with fever. Current history of hypertension, diabetes, COPD, stage III chronic kidney disease, and acute on chronic systolic heart failure.  EXAM: CHEST  2 VIEW  COMPARISON:  Portable chest x-ray yesterday, 06/02/2014. Two-view chest x-ray 06/21/2014, 09/26/2013.  FINDINGS: Cardiac silhouette markedly enlarged, unchanged. Thoracic aorta atherosclerotic, unchanged. Hilar and mediastinal contours otherwise unremarkable. Pulmonary venous hypertension and mild diffuse interstitial pulmonary edema as evidenced by Kerley B-lines, unchanged since yesterday. Chronic interstitial prominence in the lower lobes dating back to the March, 2015  examination. No new pulmonary parenchymal abnormalities. No visible pleural effusions currently. Osseous demineralization suspected with mild degenerative changes involving the thoracic spine.  IMPRESSION: 1. 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 2. No new abnormalities.   Electronically Signed   By: Hulan Saas M.D.   On: 07/31/2014 11:28   Dg Chest Portable 1 View  07/30/2014   CLINICAL DATA:  Acute onset of shortness of breath, cough and weakness. Initial encounter.  EXAM: PORTABLE CHEST - 1 VIEW  COMPARISON:  Chest radiograph performed 06/21/2014  FINDINGS: The lungs are well-aerated. Mild bibasilar airspace opacities raise concern for mild interstitial edema, though pneumonia could have a similar appearance. Vascular congestion is noted. No definite pleural effusion or pneumothorax is seen.  The cardiomediastinal silhouette is mildly enlarged. No acute osseous abnormalities are seen.  IMPRESSION: Vascular congestion and mild cardiomegaly. Mild bibasilar airspace opacities raise concern for mild  interstitial edema, though pneumonia could have a similar appearance.   Electronically Signed   By: Roanna Raider M.D.   On: 07/30/2014 02:13    Microbiology: No results found for this or any previous visit (from the past 240 hour(s)).   Labs: Basic Metabolic Panel:  Recent Labs Lab 07/31/14 0400 08/01/14 0402 08/02/14 0326 08/04/14 0434 08/05/14 0356 08/05/14 1055  NA 141 139 140 142 146*  --   K 4.0 4.1 4.5 4.7 5.8* 4.4  CL 106 105 102 95* 98  --   CO2 27 30 28  36* 37*  --   GLUCOSE 85 100* 85 153* 145*  --   BUN 34* 32* 36* 43* 58*  --   CREATININE 1.32* 1.15* 1.11* 1.28* 1.77*  --   CALCIUM 8.7 8.7 8.7 9.2 9.1  --    Liver Function Tests:  Recent Labs Lab 07/30/14 0544  AST 53*  ALT 28  ALKPHOS 191*  BILITOT 0.6  PROT 6.6  ALBUMIN 3.3*   No results for input(s): LIPASE, AMYLASE in the last 168 hours. No results  for input(s): AMMONIA in the last 168 hours. CBC:  Recent Labs Lab 07/30/14 0129 07/30/14 0544 08/02/14 1000  WBC 7.7 8.9 11.6*  NEUTROABS  --  8.2*  --   HGB 12.2 12.0 12.4  HCT 40.5 40.0 41.3  MCV 93.1 92.8 93.4  PLT 184 179 238   Cardiac Enzymes:  Recent Labs Lab 07/30/14 0544 07/30/14 1300 07/30/14 1850  TROPONINI 0.03 0.03 0.03   BNP: BNP (last 3 results)  Recent Labs  06/21/14 0229  PROBNP 28259.0*   CBG:  Recent Labs Lab 08/04/14 1106 08/04/14 1640 08/04/14 2246 08/05/14 0551 08/05/14 1137  GLUCAP 238* 259* 313* 126* 83       Signed:  HERNANDEZ ACOSTA,ESTELA  [facility] Pager: [phone] 08/05/2014, 12:00 PM

## 2014-08-06 MED ORDER — LISINOPRIL 5 MG PO TABS: 5.0000 mg | ORAL_TABLET | Freq: Every day | ORAL | Status: DC

## 2014-08-07 DIAGNOSIS — I5023 Acute on chronic systolic (congestive) heart failure: Secondary | ICD-10-CM | POA: Diagnosis not present

## 2014-08-07 DIAGNOSIS — J449 Chronic obstructive pulmonary disease, unspecified: Secondary | ICD-10-CM | POA: Diagnosis not present

## 2014-08-07 DIAGNOSIS — M159 Polyosteoarthritis, unspecified: Secondary | ICD-10-CM | POA: Diagnosis not present

## 2014-08-07 DIAGNOSIS — Z9981 Dependence on supplemental oxygen: Secondary | ICD-10-CM | POA: Diagnosis not present

## 2014-08-07 DIAGNOSIS — I10 Essential (primary) hypertension: Secondary | ICD-10-CM | POA: Diagnosis not present

## 2014-08-07 DIAGNOSIS — D899 Disorder involving the immune mechanism, unspecified: Secondary | ICD-10-CM | POA: Diagnosis not present

## 2014-08-07 DIAGNOSIS — E119 Type 2 diabetes mellitus without complications: Secondary | ICD-10-CM | POA: Diagnosis not present

## 2014-08-09 DIAGNOSIS — M25551 Pain in right hip: Secondary | ICD-10-CM | POA: Diagnosis not present

## 2014-08-09 DIAGNOSIS — Z136 Encounter for screening for cardiovascular disorders: Secondary | ICD-10-CM | POA: Diagnosis not present

## 2014-08-09 DIAGNOSIS — Z1389 Encounter for screening for other disorder: Secondary | ICD-10-CM | POA: Diagnosis not present

## 2014-08-09 DIAGNOSIS — J449 Chronic obstructive pulmonary disease, unspecified: Secondary | ICD-10-CM | POA: Diagnosis not present

## 2014-08-09 DIAGNOSIS — Z0000 Encounter for general adult medical examination without abnormal findings: Secondary | ICD-10-CM | POA: Diagnosis not present

## 2014-08-09 DIAGNOSIS — I509 Heart failure, unspecified: Secondary | ICD-10-CM | POA: Diagnosis not present

## 2014-08-09 DIAGNOSIS — E119 Type 2 diabetes mellitus without complications: Secondary | ICD-10-CM | POA: Diagnosis not present

## 2014-08-09 DIAGNOSIS — I10 Essential (primary) hypertension: Secondary | ICD-10-CM | POA: Diagnosis not present

## 2014-08-09 DIAGNOSIS — Z01118 Encounter for examination of ears and hearing with other abnormal findings: Secondary | ICD-10-CM | POA: Diagnosis not present

## 2014-08-10 DIAGNOSIS — D899 Disorder involving the immune mechanism, unspecified: Secondary | ICD-10-CM | POA: Diagnosis not present

## 2014-08-10 DIAGNOSIS — Z9981 Dependence on supplemental oxygen: Secondary | ICD-10-CM | POA: Diagnosis not present

## 2014-08-10 DIAGNOSIS — I5023 Acute on chronic systolic (congestive) heart failure: Secondary | ICD-10-CM | POA: Diagnosis not present

## 2014-08-10 DIAGNOSIS — M159 Polyosteoarthritis, unspecified: Secondary | ICD-10-CM | POA: Diagnosis not present

## 2014-08-10 DIAGNOSIS — J449 Chronic obstructive pulmonary disease, unspecified: Secondary | ICD-10-CM | POA: Diagnosis not present

## 2014-08-10 DIAGNOSIS — I10 Essential (primary) hypertension: Secondary | ICD-10-CM | POA: Diagnosis not present

## 2014-08-10 DIAGNOSIS — E119 Type 2 diabetes mellitus without complications: Secondary | ICD-10-CM | POA: Diagnosis not present

## 2014-08-12 DIAGNOSIS — J449 Chronic obstructive pulmonary disease, unspecified: Secondary | ICD-10-CM | POA: Diagnosis not present

## 2014-08-15 DIAGNOSIS — J449 Chronic obstructive pulmonary disease, unspecified: Secondary | ICD-10-CM | POA: Diagnosis not present

## 2014-08-16 DIAGNOSIS — J449 Chronic obstructive pulmonary disease, unspecified: Secondary | ICD-10-CM | POA: Diagnosis not present

## 2014-08-16 DIAGNOSIS — D899 Disorder involving the immune mechanism, unspecified: Secondary | ICD-10-CM | POA: Diagnosis not present

## 2014-08-16 DIAGNOSIS — I5023 Acute on chronic systolic (congestive) heart failure: Secondary | ICD-10-CM | POA: Diagnosis not present

## 2014-08-16 DIAGNOSIS — I10 Essential (primary) hypertension: Secondary | ICD-10-CM | POA: Diagnosis not present

## 2014-08-16 DIAGNOSIS — M159 Polyosteoarthritis, unspecified: Secondary | ICD-10-CM | POA: Diagnosis not present

## 2014-08-16 DIAGNOSIS — E119 Type 2 diabetes mellitus without complications: Secondary | ICD-10-CM | POA: Diagnosis not present

## 2014-08-16 DIAGNOSIS — Z9981 Dependence on supplemental oxygen: Secondary | ICD-10-CM | POA: Diagnosis not present

## 2014-08-17 ENCOUNTER — Inpatient Hospital Stay (HOSPITAL_COMMUNITY): Admission: RE | Admit: 2014-08-17 | Payer: Medicare Other | Source: Ambulatory Visit

## 2014-08-18 DIAGNOSIS — I5023 Acute on chronic systolic (congestive) heart failure: Secondary | ICD-10-CM | POA: Diagnosis not present

## 2014-08-18 DIAGNOSIS — I10 Essential (primary) hypertension: Secondary | ICD-10-CM | POA: Diagnosis not present

## 2014-08-18 DIAGNOSIS — J449 Chronic obstructive pulmonary disease, unspecified: Secondary | ICD-10-CM | POA: Diagnosis not present

## 2014-08-18 DIAGNOSIS — E119 Type 2 diabetes mellitus without complications: Secondary | ICD-10-CM | POA: Diagnosis not present

## 2014-08-18 DIAGNOSIS — M159 Polyosteoarthritis, unspecified: Secondary | ICD-10-CM | POA: Diagnosis not present

## 2014-08-18 DIAGNOSIS — Z9981 Dependence on supplemental oxygen: Secondary | ICD-10-CM | POA: Diagnosis not present

## 2014-08-18 DIAGNOSIS — D899 Disorder involving the immune mechanism, unspecified: Secondary | ICD-10-CM | POA: Diagnosis not present

## 2014-08-22 ENCOUNTER — Telehealth: Payer: Self-pay | Admitting: Licensed Clinical Social Worker

## 2014-08-22 NOTE — Telephone Encounter (Signed)
CSW referred to assist patient with community resources. Patient did not show for her post hospital follow up visit in the clinic. CSW attempted to contact patient via phone although unable to leave message as no voicemail available. CSW available if patient should return call or reschedule clinic visit. Lasandra Beech, LCSW [phone]

## 2014-08-25 DIAGNOSIS — I10 Essential (primary) hypertension: Secondary | ICD-10-CM | POA: Diagnosis not present

## 2014-08-25 DIAGNOSIS — Z0000 Encounter for general adult medical examination without abnormal findings: Secondary | ICD-10-CM | POA: Diagnosis not present

## 2014-08-25 DIAGNOSIS — E785 Hyperlipidemia, unspecified: Secondary | ICD-10-CM | POA: Diagnosis not present

## 2014-08-25 DIAGNOSIS — E119 Type 2 diabetes mellitus without complications: Secondary | ICD-10-CM | POA: Diagnosis not present

## 2014-08-25 DIAGNOSIS — M25551 Pain in right hip: Secondary | ICD-10-CM | POA: Diagnosis not present

## 2014-08-25 DIAGNOSIS — I509 Heart failure, unspecified: Secondary | ICD-10-CM | POA: Diagnosis not present

## 2014-08-26 DIAGNOSIS — M159 Polyosteoarthritis, unspecified: Secondary | ICD-10-CM | POA: Diagnosis not present

## 2014-08-26 DIAGNOSIS — I5023 Acute on chronic systolic (congestive) heart failure: Secondary | ICD-10-CM | POA: Diagnosis not present

## 2014-08-26 DIAGNOSIS — J449 Chronic obstructive pulmonary disease, unspecified: Secondary | ICD-10-CM | POA: Diagnosis not present

## 2014-08-26 DIAGNOSIS — Z9981 Dependence on supplemental oxygen: Secondary | ICD-10-CM | POA: Diagnosis not present

## 2014-08-26 DIAGNOSIS — I10 Essential (primary) hypertension: Secondary | ICD-10-CM | POA: Diagnosis not present

## 2014-08-26 DIAGNOSIS — D899 Disorder involving the immune mechanism, unspecified: Secondary | ICD-10-CM | POA: Diagnosis not present

## 2014-08-26 DIAGNOSIS — E119 Type 2 diabetes mellitus without complications: Secondary | ICD-10-CM | POA: Diagnosis not present

## 2014-08-29 DIAGNOSIS — M01X0 Direct infection of unspecified joint in infectious and parasitic diseases classified elsewhere: Secondary | ICD-10-CM | POA: Diagnosis not present

## 2014-08-30 DIAGNOSIS — M01X0 Direct infection of unspecified joint in infectious and parasitic diseases classified elsewhere: Secondary | ICD-10-CM | POA: Diagnosis not present

## 2014-08-31 DIAGNOSIS — M01X0 Direct infection of unspecified joint in infectious and parasitic diseases classified elsewhere: Secondary | ICD-10-CM | POA: Diagnosis not present

## 2014-09-01 DIAGNOSIS — M01X0 Direct infection of unspecified joint in infectious and parasitic diseases classified elsewhere: Secondary | ICD-10-CM | POA: Diagnosis not present

## 2014-09-02 DIAGNOSIS — M01X0 Direct infection of unspecified joint in infectious and parasitic diseases classified elsewhere: Secondary | ICD-10-CM | POA: Diagnosis not present

## 2014-09-03 DIAGNOSIS — M01X0 Direct infection of unspecified joint in infectious and parasitic diseases classified elsewhere: Secondary | ICD-10-CM | POA: Diagnosis not present

## 2014-09-04 DIAGNOSIS — M01X0 Direct infection of unspecified joint in infectious and parasitic diseases classified elsewhere: Secondary | ICD-10-CM | POA: Diagnosis not present

## 2014-09-05 DIAGNOSIS — I2510 Atherosclerotic heart disease of native coronary artery without angina pectoris: Secondary | ICD-10-CM | POA: Diagnosis not present

## 2014-09-05 DIAGNOSIS — M01X0 Direct infection of unspecified joint in infectious and parasitic diseases classified elsewhere: Secondary | ICD-10-CM | POA: Diagnosis not present

## 2014-09-05 DIAGNOSIS — I429 Cardiomyopathy, unspecified: Secondary | ICD-10-CM | POA: Diagnosis not present

## 2014-09-05 DIAGNOSIS — E785 Hyperlipidemia, unspecified: Secondary | ICD-10-CM | POA: Diagnosis not present

## 2014-09-06 DIAGNOSIS — M01X0 Direct infection of unspecified joint in infectious and parasitic diseases classified elsewhere: Secondary | ICD-10-CM | POA: Diagnosis not present

## 2014-09-07 DIAGNOSIS — M01X0 Direct infection of unspecified joint in infectious and parasitic diseases classified elsewhere: Secondary | ICD-10-CM | POA: Diagnosis not present

## 2014-09-08 DIAGNOSIS — M01X0 Direct infection of unspecified joint in infectious and parasitic diseases classified elsewhere: Secondary | ICD-10-CM | POA: Diagnosis not present

## 2014-09-09 DIAGNOSIS — J449 Chronic obstructive pulmonary disease, unspecified: Secondary | ICD-10-CM | POA: Diagnosis not present

## 2014-09-09 DIAGNOSIS — I10 Essential (primary) hypertension: Secondary | ICD-10-CM | POA: Diagnosis not present

## 2014-09-09 DIAGNOSIS — I509 Heart failure, unspecified: Secondary | ICD-10-CM | POA: Diagnosis not present

## 2014-09-09 DIAGNOSIS — E785 Hyperlipidemia, unspecified: Secondary | ICD-10-CM | POA: Diagnosis not present

## 2014-09-09 DIAGNOSIS — M25551 Pain in right hip: Secondary | ICD-10-CM | POA: Diagnosis not present

## 2014-09-09 DIAGNOSIS — M01X0 Direct infection of unspecified joint in infectious and parasitic diseases classified elsewhere: Secondary | ICD-10-CM | POA: Diagnosis not present

## 2014-09-09 DIAGNOSIS — E119 Type 2 diabetes mellitus without complications: Secondary | ICD-10-CM | POA: Diagnosis not present

## 2014-09-10 DIAGNOSIS — M01X0 Direct infection of unspecified joint in infectious and parasitic diseases classified elsewhere: Secondary | ICD-10-CM | POA: Diagnosis not present

## 2014-09-11 DIAGNOSIS — M01X0 Direct infection of unspecified joint in infectious and parasitic diseases classified elsewhere: Secondary | ICD-10-CM | POA: Diagnosis not present

## 2014-09-12 DIAGNOSIS — J449 Chronic obstructive pulmonary disease, unspecified: Secondary | ICD-10-CM | POA: Diagnosis not present

## 2014-09-12 DIAGNOSIS — M01X0 Direct infection of unspecified joint in infectious and parasitic diseases classified elsewhere: Secondary | ICD-10-CM | POA: Diagnosis not present

## 2014-09-13 DIAGNOSIS — M01X0 Direct infection of unspecified joint in infectious and parasitic diseases classified elsewhere: Secondary | ICD-10-CM | POA: Diagnosis not present

## 2014-09-14 DIAGNOSIS — M01X0 Direct infection of unspecified joint in infectious and parasitic diseases classified elsewhere: Secondary | ICD-10-CM | POA: Diagnosis not present

## 2014-09-15 DIAGNOSIS — M01X0 Direct infection of unspecified joint in infectious and parasitic diseases classified elsewhere: Secondary | ICD-10-CM | POA: Diagnosis not present

## 2014-09-16 DIAGNOSIS — M01X0 Direct infection of unspecified joint in infectious and parasitic diseases classified elsewhere: Secondary | ICD-10-CM | POA: Diagnosis not present

## 2014-09-17 DIAGNOSIS — M01X0 Direct infection of unspecified joint in infectious and parasitic diseases classified elsewhere: Secondary | ICD-10-CM | POA: Diagnosis not present

## 2014-09-18 DIAGNOSIS — M01X0 Direct infection of unspecified joint in infectious and parasitic diseases classified elsewhere: Secondary | ICD-10-CM | POA: Diagnosis not present

## 2014-09-19 DIAGNOSIS — M01X0 Direct infection of unspecified joint in infectious and parasitic diseases classified elsewhere: Secondary | ICD-10-CM | POA: Diagnosis not present

## 2014-09-20 DIAGNOSIS — M01X0 Direct infection of unspecified joint in infectious and parasitic diseases classified elsewhere: Secondary | ICD-10-CM | POA: Diagnosis not present

## 2014-09-21 DIAGNOSIS — M01X0 Direct infection of unspecified joint in infectious and parasitic diseases classified elsewhere: Secondary | ICD-10-CM | POA: Diagnosis not present

## 2014-09-22 DIAGNOSIS — M01X0 Direct infection of unspecified joint in infectious and parasitic diseases classified elsewhere: Secondary | ICD-10-CM | POA: Diagnosis not present

## 2014-09-23 DIAGNOSIS — M01X0 Direct infection of unspecified joint in infectious and parasitic diseases classified elsewhere: Secondary | ICD-10-CM | POA: Diagnosis not present

## 2014-09-24 DIAGNOSIS — M01X0 Direct infection of unspecified joint in infectious and parasitic diseases classified elsewhere: Secondary | ICD-10-CM | POA: Diagnosis not present

## 2014-09-25 DIAGNOSIS — M01X0 Direct infection of unspecified joint in infectious and parasitic diseases classified elsewhere: Secondary | ICD-10-CM | POA: Diagnosis not present

## 2014-09-26 DIAGNOSIS — M01X0 Direct infection of unspecified joint in infectious and parasitic diseases classified elsewhere: Secondary | ICD-10-CM | POA: Diagnosis not present

## 2014-09-27 DIAGNOSIS — M01X0 Direct infection of unspecified joint in infectious and parasitic diseases classified elsewhere: Secondary | ICD-10-CM | POA: Diagnosis not present

## 2014-09-28 DIAGNOSIS — M01X0 Direct infection of unspecified joint in infectious and parasitic diseases classified elsewhere: Secondary | ICD-10-CM | POA: Diagnosis not present

## 2014-09-29 DIAGNOSIS — M01X0 Direct infection of unspecified joint in infectious and parasitic diseases classified elsewhere: Secondary | ICD-10-CM | POA: Diagnosis not present

## 2014-09-30 DIAGNOSIS — M01X0 Direct infection of unspecified joint in infectious and parasitic diseases classified elsewhere: Secondary | ICD-10-CM | POA: Diagnosis not present

## 2014-10-01 DIAGNOSIS — M01X0 Direct infection of unspecified joint in infectious and parasitic diseases classified elsewhere: Secondary | ICD-10-CM | POA: Diagnosis not present

## 2014-10-02 DIAGNOSIS — M01X0 Direct infection of unspecified joint in infectious and parasitic diseases classified elsewhere: Secondary | ICD-10-CM | POA: Diagnosis not present

## 2014-10-03 DIAGNOSIS — M01X0 Direct infection of unspecified joint in infectious and parasitic diseases classified elsewhere: Secondary | ICD-10-CM | POA: Diagnosis not present

## 2014-10-04 DIAGNOSIS — M01X0 Direct infection of unspecified joint in infectious and parasitic diseases classified elsewhere: Secondary | ICD-10-CM | POA: Diagnosis not present

## 2014-10-05 DIAGNOSIS — M01X0 Direct infection of unspecified joint in infectious and parasitic diseases classified elsewhere: Secondary | ICD-10-CM | POA: Diagnosis not present

## 2014-10-06 DIAGNOSIS — M01X0 Direct infection of unspecified joint in infectious and parasitic diseases classified elsewhere: Secondary | ICD-10-CM | POA: Diagnosis not present

## 2014-10-07 DIAGNOSIS — M01X0 Direct infection of unspecified joint in infectious and parasitic diseases classified elsewhere: Secondary | ICD-10-CM | POA: Diagnosis not present

## 2014-10-08 DIAGNOSIS — J449 Chronic obstructive pulmonary disease, unspecified: Secondary | ICD-10-CM | POA: Diagnosis not present

## 2014-10-08 DIAGNOSIS — M01X0 Direct infection of unspecified joint in infectious and parasitic diseases classified elsewhere: Secondary | ICD-10-CM | POA: Diagnosis not present

## 2014-10-09 DIAGNOSIS — M01X0 Direct infection of unspecified joint in infectious and parasitic diseases classified elsewhere: Secondary | ICD-10-CM | POA: Diagnosis not present

## 2014-10-10 DIAGNOSIS — M01X0 Direct infection of unspecified joint in infectious and parasitic diseases classified elsewhere: Secondary | ICD-10-CM | POA: Diagnosis not present

## 2014-10-11 DIAGNOSIS — J449 Chronic obstructive pulmonary disease, unspecified: Secondary | ICD-10-CM | POA: Diagnosis not present

## 2014-10-11 DIAGNOSIS — M01X0 Direct infection of unspecified joint in infectious and parasitic diseases classified elsewhere: Secondary | ICD-10-CM | POA: Diagnosis not present

## 2014-10-12 DIAGNOSIS — M01X0 Direct infection of unspecified joint in infectious and parasitic diseases classified elsewhere: Secondary | ICD-10-CM | POA: Diagnosis not present

## 2014-10-13 DIAGNOSIS — M01X0 Direct infection of unspecified joint in infectious and parasitic diseases classified elsewhere: Secondary | ICD-10-CM | POA: Diagnosis not present

## 2014-10-14 DIAGNOSIS — M01X0 Direct infection of unspecified joint in infectious and parasitic diseases classified elsewhere: Secondary | ICD-10-CM | POA: Diagnosis not present

## 2014-10-15 DIAGNOSIS — M01X0 Direct infection of unspecified joint in infectious and parasitic diseases classified elsewhere: Secondary | ICD-10-CM | POA: Diagnosis not present

## 2014-10-16 DIAGNOSIS — M01X0 Direct infection of unspecified joint in infectious and parasitic diseases classified elsewhere: Secondary | ICD-10-CM | POA: Diagnosis not present

## 2014-10-17 DIAGNOSIS — M01X0 Direct infection of unspecified joint in infectious and parasitic diseases classified elsewhere: Secondary | ICD-10-CM | POA: Diagnosis not present

## 2014-10-18 DIAGNOSIS — M01X0 Direct infection of unspecified joint in infectious and parasitic diseases classified elsewhere: Secondary | ICD-10-CM | POA: Diagnosis not present

## 2014-10-19 DIAGNOSIS — M01X0 Direct infection of unspecified joint in infectious and parasitic diseases classified elsewhere: Secondary | ICD-10-CM | POA: Diagnosis not present

## 2014-10-20 DIAGNOSIS — M01X0 Direct infection of unspecified joint in infectious and parasitic diseases classified elsewhere: Secondary | ICD-10-CM | POA: Diagnosis not present

## 2014-10-21 DIAGNOSIS — M01X0 Direct infection of unspecified joint in infectious and parasitic diseases classified elsewhere: Secondary | ICD-10-CM | POA: Diagnosis not present

## 2014-10-22 DIAGNOSIS — M01X0 Direct infection of unspecified joint in infectious and parasitic diseases classified elsewhere: Secondary | ICD-10-CM | POA: Diagnosis not present

## 2014-10-23 DIAGNOSIS — M01X0 Direct infection of unspecified joint in infectious and parasitic diseases classified elsewhere: Secondary | ICD-10-CM | POA: Diagnosis not present

## 2014-11-07 DIAGNOSIS — I509 Heart failure, unspecified: Secondary | ICD-10-CM | POA: Diagnosis not present

## 2014-11-07 DIAGNOSIS — E119 Type 2 diabetes mellitus without complications: Secondary | ICD-10-CM | POA: Diagnosis not present

## 2014-11-07 DIAGNOSIS — E785 Hyperlipidemia, unspecified: Secondary | ICD-10-CM | POA: Diagnosis not present

## 2014-11-07 DIAGNOSIS — M25551 Pain in right hip: Secondary | ICD-10-CM | POA: Diagnosis not present

## 2014-11-07 DIAGNOSIS — I10 Essential (primary) hypertension: Secondary | ICD-10-CM | POA: Diagnosis not present

## 2014-11-08 DIAGNOSIS — J449 Chronic obstructive pulmonary disease, unspecified: Secondary | ICD-10-CM | POA: Diagnosis not present

## 2014-11-11 DIAGNOSIS — J449 Chronic obstructive pulmonary disease, unspecified: Secondary | ICD-10-CM | POA: Diagnosis not present

## 2014-12-02 DIAGNOSIS — Z720 Tobacco use: Secondary | ICD-10-CM | POA: Diagnosis not present

## 2014-12-02 DIAGNOSIS — E119 Type 2 diabetes mellitus without complications: Secondary | ICD-10-CM | POA: Diagnosis not present

## 2014-12-02 DIAGNOSIS — E785 Hyperlipidemia, unspecified: Secondary | ICD-10-CM | POA: Diagnosis not present

## 2014-12-02 DIAGNOSIS — I70209 Unspecified atherosclerosis of native arteries of extremities, unspecified extremity: Secondary | ICD-10-CM | POA: Diagnosis not present

## 2014-12-02 DIAGNOSIS — I10 Essential (primary) hypertension: Secondary | ICD-10-CM | POA: Diagnosis not present

## 2014-12-08 DIAGNOSIS — J449 Chronic obstructive pulmonary disease, unspecified: Secondary | ICD-10-CM | POA: Diagnosis not present

## 2014-12-11 DIAGNOSIS — J449 Chronic obstructive pulmonary disease, unspecified: Secondary | ICD-10-CM | POA: Diagnosis not present

## 2014-12-24 DIAGNOSIS — J9611 Chronic respiratory failure with hypoxia: Secondary | ICD-10-CM | POA: Diagnosis not present

## 2014-12-24 DIAGNOSIS — I10 Essential (primary) hypertension: Secondary | ICD-10-CM | POA: Diagnosis not present

## 2014-12-24 DIAGNOSIS — M545 Low back pain: Secondary | ICD-10-CM | POA: Diagnosis not present

## 2014-12-24 DIAGNOSIS — I509 Heart failure, unspecified: Secondary | ICD-10-CM | POA: Diagnosis not present

## 2014-12-24 DIAGNOSIS — I081 Rheumatic disorders of both mitral and tricuspid valves: Secondary | ICD-10-CM | POA: Diagnosis not present

## 2014-12-24 DIAGNOSIS — Z9119 Patient's noncompliance with other medical treatment and regimen: Secondary | ICD-10-CM | POA: Diagnosis not present

## 2014-12-24 DIAGNOSIS — R05 Cough: Secondary | ICD-10-CM | POA: Diagnosis not present

## 2014-12-24 DIAGNOSIS — R0602 Shortness of breath: Secondary | ICD-10-CM | POA: Diagnosis not present

## 2014-12-24 DIAGNOSIS — Z794 Long term (current) use of insulin: Secondary | ICD-10-CM | POA: Diagnosis not present

## 2014-12-24 DIAGNOSIS — E8770 Fluid overload, unspecified: Secondary | ICD-10-CM | POA: Diagnosis not present

## 2014-12-24 DIAGNOSIS — I361 Nonrheumatic tricuspid (valve) insufficiency: Secondary | ICD-10-CM | POA: Diagnosis not present

## 2014-12-24 DIAGNOSIS — J449 Chronic obstructive pulmonary disease, unspecified: Secondary | ICD-10-CM | POA: Diagnosis not present

## 2014-12-24 DIAGNOSIS — J45909 Unspecified asthma, uncomplicated: Secondary | ICD-10-CM | POA: Diagnosis not present

## 2014-12-24 DIAGNOSIS — J439 Emphysema, unspecified: Secondary | ICD-10-CM | POA: Diagnosis not present

## 2014-12-24 DIAGNOSIS — I340 Nonrheumatic mitral (valve) insufficiency: Secondary | ICD-10-CM | POA: Diagnosis not present

## 2014-12-24 DIAGNOSIS — M5136 Other intervertebral disc degeneration, lumbar region: Secondary | ICD-10-CM | POA: Diagnosis not present

## 2014-12-24 DIAGNOSIS — I5023 Acute on chronic systolic (congestive) heart failure: Secondary | ICD-10-CM | POA: Diagnosis not present

## 2014-12-24 DIAGNOSIS — R531 Weakness: Secondary | ICD-10-CM | POA: Diagnosis not present

## 2014-12-24 DIAGNOSIS — E118 Type 2 diabetes mellitus with unspecified complications: Secondary | ICD-10-CM | POA: Diagnosis not present

## 2014-12-24 DIAGNOSIS — F17200 Nicotine dependence, unspecified, uncomplicated: Secondary | ICD-10-CM | POA: Diagnosis not present

## 2014-12-24 DIAGNOSIS — G8929 Other chronic pain: Secondary | ICD-10-CM | POA: Diagnosis not present

## 2014-12-24 DIAGNOSIS — E785 Hyperlipidemia, unspecified: Secondary | ICD-10-CM | POA: Diagnosis not present

## 2014-12-24 DIAGNOSIS — I272 Other secondary pulmonary hypertension: Secondary | ICD-10-CM | POA: Diagnosis not present

## 2014-12-24 DIAGNOSIS — I428 Other cardiomyopathies: Secondary | ICD-10-CM | POA: Diagnosis not present

## 2014-12-24 DIAGNOSIS — R069 Unspecified abnormalities of breathing: Secondary | ICD-10-CM | POA: Diagnosis not present

## 2014-12-24 DIAGNOSIS — I420 Dilated cardiomyopathy: Secondary | ICD-10-CM | POA: Diagnosis not present

## 2015-01-06 DIAGNOSIS — I491 Atrial premature depolarization: Secondary | ICD-10-CM | POA: Diagnosis not present

## 2015-01-06 DIAGNOSIS — M5136 Other intervertebral disc degeneration, lumbar region: Secondary | ICD-10-CM | POA: Diagnosis not present

## 2015-01-06 DIAGNOSIS — I509 Heart failure, unspecified: Secondary | ICD-10-CM | POA: Diagnosis not present

## 2015-01-06 DIAGNOSIS — I10 Essential (primary) hypertension: Secondary | ICD-10-CM | POA: Diagnosis not present

## 2015-01-06 DIAGNOSIS — R079 Chest pain, unspecified: Secondary | ICD-10-CM | POA: Diagnosis not present

## 2015-01-06 DIAGNOSIS — F17210 Nicotine dependence, cigarettes, uncomplicated: Secondary | ICD-10-CM | POA: Diagnosis not present

## 2015-01-06 DIAGNOSIS — N179 Acute kidney failure, unspecified: Secondary | ICD-10-CM | POA: Diagnosis not present

## 2015-01-06 DIAGNOSIS — E785 Hyperlipidemia, unspecified: Secondary | ICD-10-CM | POA: Diagnosis not present

## 2015-01-06 DIAGNOSIS — E118 Type 2 diabetes mellitus with unspecified complications: Secondary | ICD-10-CM | POA: Diagnosis not present

## 2015-01-06 DIAGNOSIS — Z9111 Patient's noncompliance with dietary regimen: Secondary | ICD-10-CM | POA: Diagnosis not present

## 2015-01-06 DIAGNOSIS — M546 Pain in thoracic spine: Secondary | ICD-10-CM | POA: Diagnosis not present

## 2015-01-06 DIAGNOSIS — S299XXA Unspecified injury of thorax, initial encounter: Secondary | ICD-10-CM | POA: Diagnosis not present

## 2015-01-06 DIAGNOSIS — N183 Chronic kidney disease, stage 3 (moderate): Secondary | ICD-10-CM | POA: Diagnosis not present

## 2015-01-06 DIAGNOSIS — Z9114 Patient's other noncompliance with medication regimen: Secondary | ICD-10-CM | POA: Diagnosis not present

## 2015-01-06 DIAGNOSIS — G8929 Other chronic pain: Secondary | ICD-10-CM | POA: Diagnosis not present

## 2015-01-06 DIAGNOSIS — M549 Dorsalgia, unspecified: Secondary | ICD-10-CM | POA: Diagnosis not present

## 2015-01-06 DIAGNOSIS — R0602 Shortness of breath: Secondary | ICD-10-CM | POA: Diagnosis not present

## 2015-01-06 DIAGNOSIS — E875 Hyperkalemia: Secondary | ICD-10-CM | POA: Diagnosis not present

## 2015-01-06 DIAGNOSIS — I129 Hypertensive chronic kidney disease with stage 1 through stage 4 chronic kidney disease, or unspecified chronic kidney disease: Secondary | ICD-10-CM | POA: Diagnosis not present

## 2015-01-06 DIAGNOSIS — J811 Chronic pulmonary edema: Secondary | ICD-10-CM | POA: Diagnosis not present

## 2015-01-06 DIAGNOSIS — I420 Dilated cardiomyopathy: Secondary | ICD-10-CM | POA: Diagnosis not present

## 2015-01-06 DIAGNOSIS — R0789 Other chest pain: Secondary | ICD-10-CM | POA: Diagnosis not present

## 2015-01-06 DIAGNOSIS — J45909 Unspecified asthma, uncomplicated: Secondary | ICD-10-CM | POA: Diagnosis not present

## 2015-01-06 DIAGNOSIS — I517 Cardiomegaly: Secondary | ICD-10-CM | POA: Diagnosis not present

## 2015-01-06 DIAGNOSIS — I5023 Acute on chronic systolic (congestive) heart failure: Secondary | ICD-10-CM | POA: Diagnosis not present

## 2015-01-06 DIAGNOSIS — J449 Chronic obstructive pulmonary disease, unspecified: Secondary | ICD-10-CM | POA: Diagnosis not present

## 2015-01-07 DIAGNOSIS — J45909 Unspecified asthma, uncomplicated: Secondary | ICD-10-CM | POA: Diagnosis not present

## 2015-01-07 DIAGNOSIS — I420 Dilated cardiomyopathy: Secondary | ICD-10-CM | POA: Diagnosis not present

## 2015-01-07 DIAGNOSIS — E785 Hyperlipidemia, unspecified: Secondary | ICD-10-CM | POA: Diagnosis not present

## 2015-01-07 DIAGNOSIS — I5023 Acute on chronic systolic (congestive) heart failure: Secondary | ICD-10-CM | POA: Diagnosis not present

## 2015-01-07 DIAGNOSIS — M5136 Other intervertebral disc degeneration, lumbar region: Secondary | ICD-10-CM | POA: Diagnosis not present

## 2015-01-07 DIAGNOSIS — G8929 Other chronic pain: Secondary | ICD-10-CM | POA: Diagnosis not present

## 2015-01-07 DIAGNOSIS — N179 Acute kidney failure, unspecified: Secondary | ICD-10-CM | POA: Diagnosis not present

## 2015-01-07 DIAGNOSIS — E875 Hyperkalemia: Secondary | ICD-10-CM | POA: Diagnosis not present

## 2015-01-07 DIAGNOSIS — I129 Hypertensive chronic kidney disease with stage 1 through stage 4 chronic kidney disease, or unspecified chronic kidney disease: Secondary | ICD-10-CM | POA: Diagnosis not present

## 2015-01-07 DIAGNOSIS — F17210 Nicotine dependence, cigarettes, uncomplicated: Secondary | ICD-10-CM | POA: Diagnosis not present

## 2015-01-07 DIAGNOSIS — Z9114 Patient's other noncompliance with medication regimen: Secondary | ICD-10-CM | POA: Diagnosis not present

## 2015-01-07 DIAGNOSIS — N183 Chronic kidney disease, stage 3 (moderate): Secondary | ICD-10-CM | POA: Diagnosis not present

## 2015-01-07 DIAGNOSIS — R0789 Other chest pain: Secondary | ICD-10-CM | POA: Diagnosis not present

## 2015-01-07 DIAGNOSIS — Z9111 Patient's noncompliance with dietary regimen: Secondary | ICD-10-CM | POA: Diagnosis not present

## 2015-01-07 DIAGNOSIS — J449 Chronic obstructive pulmonary disease, unspecified: Secondary | ICD-10-CM | POA: Diagnosis not present

## 2015-01-07 DIAGNOSIS — M549 Dorsalgia, unspecified: Secondary | ICD-10-CM | POA: Diagnosis not present

## 2015-01-07 DIAGNOSIS — E118 Type 2 diabetes mellitus with unspecified complications: Secondary | ICD-10-CM | POA: Diagnosis not present

## 2015-01-08 DIAGNOSIS — R0789 Other chest pain: Secondary | ICD-10-CM | POA: Diagnosis not present

## 2015-01-08 DIAGNOSIS — M549 Dorsalgia, unspecified: Secondary | ICD-10-CM | POA: Diagnosis not present

## 2015-01-08 DIAGNOSIS — E785 Hyperlipidemia, unspecified: Secondary | ICD-10-CM | POA: Diagnosis not present

## 2015-01-08 DIAGNOSIS — Z9111 Patient's noncompliance with dietary regimen: Secondary | ICD-10-CM | POA: Diagnosis not present

## 2015-01-08 DIAGNOSIS — M5136 Other intervertebral disc degeneration, lumbar region: Secondary | ICD-10-CM | POA: Diagnosis not present

## 2015-01-08 DIAGNOSIS — I420 Dilated cardiomyopathy: Secondary | ICD-10-CM | POA: Diagnosis not present

## 2015-01-08 DIAGNOSIS — F17210 Nicotine dependence, cigarettes, uncomplicated: Secondary | ICD-10-CM | POA: Diagnosis not present

## 2015-01-08 DIAGNOSIS — G8929 Other chronic pain: Secondary | ICD-10-CM | POA: Diagnosis not present

## 2015-01-08 DIAGNOSIS — Z9114 Patient's other noncompliance with medication regimen: Secondary | ICD-10-CM | POA: Diagnosis not present

## 2015-01-08 DIAGNOSIS — E875 Hyperkalemia: Secondary | ICD-10-CM | POA: Diagnosis not present

## 2015-01-08 DIAGNOSIS — I129 Hypertensive chronic kidney disease with stage 1 through stage 4 chronic kidney disease, or unspecified chronic kidney disease: Secondary | ICD-10-CM | POA: Diagnosis not present

## 2015-01-08 DIAGNOSIS — I5023 Acute on chronic systolic (congestive) heart failure: Secondary | ICD-10-CM | POA: Diagnosis not present

## 2015-01-08 DIAGNOSIS — J449 Chronic obstructive pulmonary disease, unspecified: Secondary | ICD-10-CM | POA: Diagnosis not present

## 2015-01-08 DIAGNOSIS — E118 Type 2 diabetes mellitus with unspecified complications: Secondary | ICD-10-CM | POA: Diagnosis not present

## 2015-01-08 DIAGNOSIS — N183 Chronic kidney disease, stage 3 (moderate): Secondary | ICD-10-CM | POA: Diagnosis not present

## 2015-01-08 DIAGNOSIS — J45909 Unspecified asthma, uncomplicated: Secondary | ICD-10-CM | POA: Diagnosis not present

## 2015-01-08 DIAGNOSIS — N179 Acute kidney failure, unspecified: Secondary | ICD-10-CM | POA: Diagnosis not present

## 2015-01-09 DIAGNOSIS — I5023 Acute on chronic systolic (congestive) heart failure: Secondary | ICD-10-CM | POA: Diagnosis not present

## 2015-01-09 DIAGNOSIS — I129 Hypertensive chronic kidney disease with stage 1 through stage 4 chronic kidney disease, or unspecified chronic kidney disease: Secondary | ICD-10-CM | POA: Diagnosis not present

## 2015-01-09 DIAGNOSIS — Z9114 Patient's other noncompliance with medication regimen: Secondary | ICD-10-CM | POA: Diagnosis not present

## 2015-01-09 DIAGNOSIS — M549 Dorsalgia, unspecified: Secondary | ICD-10-CM | POA: Diagnosis not present

## 2015-01-09 DIAGNOSIS — J45909 Unspecified asthma, uncomplicated: Secondary | ICD-10-CM | POA: Diagnosis not present

## 2015-01-09 DIAGNOSIS — E785 Hyperlipidemia, unspecified: Secondary | ICD-10-CM | POA: Diagnosis not present

## 2015-01-09 DIAGNOSIS — E875 Hyperkalemia: Secondary | ICD-10-CM | POA: Diagnosis not present

## 2015-01-09 DIAGNOSIS — J449 Chronic obstructive pulmonary disease, unspecified: Secondary | ICD-10-CM | POA: Diagnosis not present

## 2015-01-09 DIAGNOSIS — N179 Acute kidney failure, unspecified: Secondary | ICD-10-CM | POA: Diagnosis not present

## 2015-01-09 DIAGNOSIS — F17210 Nicotine dependence, cigarettes, uncomplicated: Secondary | ICD-10-CM | POA: Diagnosis not present

## 2015-01-09 DIAGNOSIS — G8929 Other chronic pain: Secondary | ICD-10-CM | POA: Diagnosis not present

## 2015-01-09 DIAGNOSIS — R0789 Other chest pain: Secondary | ICD-10-CM | POA: Diagnosis not present

## 2015-01-09 DIAGNOSIS — I420 Dilated cardiomyopathy: Secondary | ICD-10-CM | POA: Diagnosis not present

## 2015-01-09 DIAGNOSIS — Z9111 Patient's noncompliance with dietary regimen: Secondary | ICD-10-CM | POA: Diagnosis not present

## 2015-01-09 DIAGNOSIS — M5136 Other intervertebral disc degeneration, lumbar region: Secondary | ICD-10-CM | POA: Diagnosis not present

## 2015-01-09 DIAGNOSIS — N183 Chronic kidney disease, stage 3 (moderate): Secondary | ICD-10-CM | POA: Diagnosis not present

## 2015-01-09 DIAGNOSIS — E118 Type 2 diabetes mellitus with unspecified complications: Secondary | ICD-10-CM | POA: Diagnosis not present

## 2015-01-10 DIAGNOSIS — M549 Dorsalgia, unspecified: Secondary | ICD-10-CM | POA: Diagnosis not present

## 2015-01-10 DIAGNOSIS — N179 Acute kidney failure, unspecified: Secondary | ICD-10-CM | POA: Diagnosis not present

## 2015-01-10 DIAGNOSIS — E785 Hyperlipidemia, unspecified: Secondary | ICD-10-CM | POA: Diagnosis not present

## 2015-01-10 DIAGNOSIS — I420 Dilated cardiomyopathy: Secondary | ICD-10-CM | POA: Diagnosis not present

## 2015-01-10 DIAGNOSIS — G8929 Other chronic pain: Secondary | ICD-10-CM | POA: Diagnosis not present

## 2015-01-10 DIAGNOSIS — M5136 Other intervertebral disc degeneration, lumbar region: Secondary | ICD-10-CM | POA: Diagnosis not present

## 2015-01-10 DIAGNOSIS — Z9111 Patient's noncompliance with dietary regimen: Secondary | ICD-10-CM | POA: Diagnosis not present

## 2015-01-10 DIAGNOSIS — E875 Hyperkalemia: Secondary | ICD-10-CM | POA: Diagnosis not present

## 2015-01-10 DIAGNOSIS — R0789 Other chest pain: Secondary | ICD-10-CM | POA: Diagnosis not present

## 2015-01-10 DIAGNOSIS — I5023 Acute on chronic systolic (congestive) heart failure: Secondary | ICD-10-CM | POA: Diagnosis not present

## 2015-01-10 DIAGNOSIS — I129 Hypertensive chronic kidney disease with stage 1 through stage 4 chronic kidney disease, or unspecified chronic kidney disease: Secondary | ICD-10-CM | POA: Diagnosis not present

## 2015-01-10 DIAGNOSIS — J45909 Unspecified asthma, uncomplicated: Secondary | ICD-10-CM | POA: Diagnosis not present

## 2015-01-10 DIAGNOSIS — N183 Chronic kidney disease, stage 3 (moderate): Secondary | ICD-10-CM | POA: Diagnosis not present

## 2015-01-10 DIAGNOSIS — Z9114 Patient's other noncompliance with medication regimen: Secondary | ICD-10-CM | POA: Diagnosis not present

## 2015-01-10 DIAGNOSIS — F17210 Nicotine dependence, cigarettes, uncomplicated: Secondary | ICD-10-CM | POA: Diagnosis not present

## 2015-01-10 DIAGNOSIS — J449 Chronic obstructive pulmonary disease, unspecified: Secondary | ICD-10-CM | POA: Diagnosis not present

## 2015-01-10 DIAGNOSIS — E118 Type 2 diabetes mellitus with unspecified complications: Secondary | ICD-10-CM | POA: Diagnosis not present

## 2015-01-11 DIAGNOSIS — J449 Chronic obstructive pulmonary disease, unspecified: Secondary | ICD-10-CM | POA: Diagnosis not present

## 2015-01-19 DIAGNOSIS — I70209 Unspecified atherosclerosis of native arteries of extremities, unspecified extremity: Secondary | ICD-10-CM | POA: Diagnosis not present

## 2015-01-19 DIAGNOSIS — I10 Essential (primary) hypertension: Secondary | ICD-10-CM | POA: Diagnosis not present

## 2015-01-19 DIAGNOSIS — Z720 Tobacco use: Secondary | ICD-10-CM | POA: Diagnosis not present

## 2015-01-19 DIAGNOSIS — E119 Type 2 diabetes mellitus without complications: Secondary | ICD-10-CM | POA: Diagnosis not present

## 2015-01-19 DIAGNOSIS — E785 Hyperlipidemia, unspecified: Secondary | ICD-10-CM | POA: Diagnosis not present

## 2015-01-19 DIAGNOSIS — Z7689 Persons encountering health services in other specified circumstances: Secondary | ICD-10-CM | POA: Diagnosis not present

## 2015-02-07 DIAGNOSIS — R2681 Unsteadiness on feet: Secondary | ICD-10-CM | POA: Diagnosis not present

## 2015-02-07 DIAGNOSIS — M2550 Pain in unspecified joint: Secondary | ICD-10-CM | POA: Diagnosis not present

## 2015-02-07 DIAGNOSIS — J449 Chronic obstructive pulmonary disease, unspecified: Secondary | ICD-10-CM | POA: Diagnosis not present

## 2015-02-10 DIAGNOSIS — J449 Chronic obstructive pulmonary disease, unspecified: Secondary | ICD-10-CM | POA: Diagnosis not present

## 2015-03-03 DIAGNOSIS — I70209 Unspecified atherosclerosis of native arteries of extremities, unspecified extremity: Secondary | ICD-10-CM | POA: Diagnosis not present

## 2015-03-03 DIAGNOSIS — Z0000 Encounter for general adult medical examination without abnormal findings: Secondary | ICD-10-CM | POA: Diagnosis not present

## 2015-03-03 DIAGNOSIS — Z1389 Encounter for screening for other disorder: Secondary | ICD-10-CM | POA: Diagnosis not present

## 2015-03-03 DIAGNOSIS — Z7689 Persons encountering health services in other specified circumstances: Secondary | ICD-10-CM | POA: Diagnosis not present

## 2015-03-03 DIAGNOSIS — E119 Type 2 diabetes mellitus without complications: Secondary | ICD-10-CM | POA: Diagnosis not present

## 2015-03-03 DIAGNOSIS — I10 Essential (primary) hypertension: Secondary | ICD-10-CM | POA: Diagnosis not present

## 2015-03-10 DIAGNOSIS — J449 Chronic obstructive pulmonary disease, unspecified: Secondary | ICD-10-CM | POA: Diagnosis not present

## 2015-03-13 DIAGNOSIS — J449 Chronic obstructive pulmonary disease, unspecified: Secondary | ICD-10-CM | POA: Diagnosis not present

## 2015-03-14 DIAGNOSIS — I10 Essential (primary) hypertension: Secondary | ICD-10-CM | POA: Diagnosis not present

## 2015-03-14 DIAGNOSIS — Z9114 Patient's other noncompliance with medication regimen: Secondary | ICD-10-CM | POA: Diagnosis not present

## 2015-03-14 DIAGNOSIS — Z9111 Patient's noncompliance with dietary regimen: Secondary | ICD-10-CM | POA: Diagnosis not present

## 2015-03-14 DIAGNOSIS — I420 Dilated cardiomyopathy: Secondary | ICD-10-CM | POA: Diagnosis not present

## 2015-03-28 DIAGNOSIS — J9691 Respiratory failure, unspecified with hypoxia: Secondary | ICD-10-CM | POA: Diagnosis not present

## 2015-03-28 DIAGNOSIS — I479 Paroxysmal tachycardia, unspecified: Secondary | ICD-10-CM | POA: Diagnosis not present

## 2015-03-28 DIAGNOSIS — E875 Hyperkalemia: Secondary | ICD-10-CM | POA: Diagnosis not present

## 2015-03-28 DIAGNOSIS — J441 Chronic obstructive pulmonary disease with (acute) exacerbation: Secondary | ICD-10-CM | POA: Diagnosis not present

## 2015-03-28 DIAGNOSIS — Z9981 Dependence on supplemental oxygen: Secondary | ICD-10-CM | POA: Diagnosis not present

## 2015-03-28 DIAGNOSIS — J9690 Respiratory failure, unspecified, unspecified whether with hypoxia or hypercapnia: Secondary | ICD-10-CM | POA: Diagnosis not present

## 2015-03-28 DIAGNOSIS — J45909 Unspecified asthma, uncomplicated: Secondary | ICD-10-CM | POA: Diagnosis not present

## 2015-03-28 DIAGNOSIS — I420 Dilated cardiomyopathy: Secondary | ICD-10-CM | POA: Diagnosis not present

## 2015-03-28 DIAGNOSIS — Z452 Encounter for adjustment and management of vascular access device: Secondary | ICD-10-CM | POA: Diagnosis not present

## 2015-03-28 DIAGNOSIS — D631 Anemia in chronic kidney disease: Secondary | ICD-10-CM | POA: Diagnosis not present

## 2015-03-28 DIAGNOSIS — I130 Hypertensive heart and chronic kidney disease with heart failure and stage 1 through stage 4 chronic kidney disease, or unspecified chronic kidney disease: Secondary | ICD-10-CM | POA: Diagnosis not present

## 2015-03-28 DIAGNOSIS — I517 Cardiomegaly: Secondary | ICD-10-CM | POA: Diagnosis not present

## 2015-03-28 DIAGNOSIS — J9601 Acute respiratory failure with hypoxia: Secondary | ICD-10-CM | POA: Diagnosis not present

## 2015-03-28 DIAGNOSIS — Z515 Encounter for palliative care: Secondary | ICD-10-CM | POA: Diagnosis not present

## 2015-03-28 DIAGNOSIS — R0602 Shortness of breath: Secondary | ICD-10-CM | POA: Diagnosis not present

## 2015-03-28 DIAGNOSIS — Z992 Dependence on renal dialysis: Secondary | ICD-10-CM | POA: Diagnosis not present

## 2015-03-28 DIAGNOSIS — J449 Chronic obstructive pulmonary disease, unspecified: Secondary | ICD-10-CM | POA: Diagnosis not present

## 2015-03-28 DIAGNOSIS — J9621 Acute and chronic respiratory failure with hypoxia: Secondary | ICD-10-CM | POA: Diagnosis not present

## 2015-03-28 DIAGNOSIS — Z8249 Family history of ischemic heart disease and other diseases of the circulatory system: Secondary | ICD-10-CM | POA: Diagnosis not present

## 2015-03-28 DIAGNOSIS — J810 Acute pulmonary edema: Secondary | ICD-10-CM | POA: Diagnosis not present

## 2015-03-28 DIAGNOSIS — J9600 Acute respiratory failure, unspecified whether with hypoxia or hypercapnia: Secondary | ICD-10-CM | POA: Diagnosis not present

## 2015-03-28 DIAGNOSIS — E118 Type 2 diabetes mellitus with unspecified complications: Secondary | ICD-10-CM | POA: Diagnosis not present

## 2015-03-28 DIAGNOSIS — E785 Hyperlipidemia, unspecified: Secondary | ICD-10-CM | POA: Diagnosis not present

## 2015-03-28 DIAGNOSIS — R0989 Other specified symptoms and signs involving the circulatory and respiratory systems: Secondary | ICD-10-CM | POA: Diagnosis not present

## 2015-03-28 DIAGNOSIS — N183 Chronic kidney disease, stage 3 (moderate): Secondary | ICD-10-CM | POA: Diagnosis not present

## 2015-03-28 DIAGNOSIS — D649 Anemia, unspecified: Secondary | ICD-10-CM | POA: Diagnosis not present

## 2015-03-28 DIAGNOSIS — D630 Anemia in neoplastic disease: Secondary | ICD-10-CM | POA: Diagnosis not present

## 2015-03-28 DIAGNOSIS — J9811 Atelectasis: Secondary | ICD-10-CM | POA: Diagnosis not present

## 2015-03-28 DIAGNOSIS — E870 Hyperosmolality and hypernatremia: Secondary | ICD-10-CM | POA: Diagnosis not present

## 2015-03-28 DIAGNOSIS — N186 End stage renal disease: Secondary | ICD-10-CM | POA: Diagnosis not present

## 2015-03-28 DIAGNOSIS — I959 Hypotension, unspecified: Secondary | ICD-10-CM | POA: Diagnosis not present

## 2015-03-28 DIAGNOSIS — E876 Hypokalemia: Secondary | ICD-10-CM | POA: Diagnosis not present

## 2015-03-28 DIAGNOSIS — F17210 Nicotine dependence, cigarettes, uncomplicated: Secondary | ICD-10-CM | POA: Diagnosis not present

## 2015-03-28 DIAGNOSIS — I10 Essential (primary) hypertension: Secondary | ICD-10-CM | POA: Diagnosis not present

## 2015-03-28 DIAGNOSIS — J439 Emphysema, unspecified: Secondary | ICD-10-CM | POA: Diagnosis not present

## 2015-03-28 DIAGNOSIS — R0902 Hypoxemia: Secondary | ICD-10-CM | POA: Diagnosis not present

## 2015-03-28 DIAGNOSIS — I080 Rheumatic disorders of both mitral and aortic valves: Secondary | ICD-10-CM | POA: Diagnosis not present

## 2015-03-28 DIAGNOSIS — I2510 Atherosclerotic heart disease of native coronary artery without angina pectoris: Secondary | ICD-10-CM | POA: Diagnosis not present

## 2015-03-28 DIAGNOSIS — Z720 Tobacco use: Secondary | ICD-10-CM | POA: Diagnosis not present

## 2015-03-28 DIAGNOSIS — R609 Edema, unspecified: Secondary | ICD-10-CM | POA: Diagnosis not present

## 2015-03-28 DIAGNOSIS — I428 Other cardiomyopathies: Secondary | ICD-10-CM | POA: Diagnosis not present

## 2015-03-28 DIAGNOSIS — R600 Localized edema: Secondary | ICD-10-CM | POA: Diagnosis not present

## 2015-03-28 DIAGNOSIS — Z7982 Long term (current) use of aspirin: Secondary | ICD-10-CM | POA: Diagnosis not present

## 2015-03-28 DIAGNOSIS — E669 Obesity, unspecified: Secondary | ICD-10-CM | POA: Diagnosis not present

## 2015-03-28 DIAGNOSIS — I5023 Acute on chronic systolic (congestive) heart failure: Secondary | ICD-10-CM | POA: Diagnosis not present

## 2015-03-28 DIAGNOSIS — I509 Heart failure, unspecified: Secondary | ICD-10-CM | POA: Diagnosis not present

## 2015-03-28 DIAGNOSIS — I132 Hypertensive heart and chronic kidney disease with heart failure and with stage 5 chronic kidney disease, or end stage renal disease: Secondary | ICD-10-CM | POA: Diagnosis not present

## 2015-03-28 DIAGNOSIS — N179 Acute kidney failure, unspecified: Secondary | ICD-10-CM | POA: Diagnosis not present

## 2015-04-15 DIAGNOSIS — N186 End stage renal disease: Secondary | ICD-10-CM | POA: Diagnosis not present

## 2015-04-15 DIAGNOSIS — I10 Essential (primary) hypertension: Secondary | ICD-10-CM | POA: Diagnosis not present

## 2015-04-15 DIAGNOSIS — D631 Anemia in chronic kidney disease: Secondary | ICD-10-CM | POA: Diagnosis not present

## 2015-04-20 DIAGNOSIS — D509 Iron deficiency anemia, unspecified: Secondary | ICD-10-CM | POA: Diagnosis not present

## 2015-04-20 DIAGNOSIS — T8249XA Other complication of vascular dialysis catheter, initial encounter: Secondary | ICD-10-CM | POA: Diagnosis not present

## 2015-04-20 DIAGNOSIS — E118 Type 2 diabetes mellitus with unspecified complications: Secondary | ICD-10-CM | POA: Diagnosis not present

## 2015-04-20 DIAGNOSIS — K769 Liver disease, unspecified: Secondary | ICD-10-CM | POA: Diagnosis not present

## 2015-04-20 DIAGNOSIS — N186 End stage renal disease: Secondary | ICD-10-CM | POA: Diagnosis not present

## 2015-04-21 DIAGNOSIS — N183 Chronic kidney disease, stage 3 (moderate): Secondary | ICD-10-CM | POA: Diagnosis not present

## 2015-04-21 DIAGNOSIS — I10 Essential (primary) hypertension: Secondary | ICD-10-CM | POA: Diagnosis not present

## 2015-04-21 DIAGNOSIS — Z9981 Dependence on supplemental oxygen: Secondary | ICD-10-CM | POA: Diagnosis not present

## 2015-04-21 DIAGNOSIS — I480 Paroxysmal atrial fibrillation: Secondary | ICD-10-CM | POA: Diagnosis not present

## 2015-04-21 DIAGNOSIS — Z9114 Patient's other noncompliance with medication regimen: Secondary | ICD-10-CM | POA: Diagnosis not present

## 2015-04-21 DIAGNOSIS — E1122 Type 2 diabetes mellitus with diabetic chronic kidney disease: Secondary | ICD-10-CM | POA: Diagnosis not present

## 2015-04-21 DIAGNOSIS — E119 Type 2 diabetes mellitus without complications: Secondary | ICD-10-CM | POA: Diagnosis not present

## 2015-04-21 DIAGNOSIS — I70209 Unspecified atherosclerosis of native arteries of extremities, unspecified extremity: Secondary | ICD-10-CM | POA: Diagnosis not present

## 2015-04-21 DIAGNOSIS — Z9111 Patient's noncompliance with dietary regimen: Secondary | ICD-10-CM | POA: Diagnosis not present

## 2015-04-21 DIAGNOSIS — Z794 Long term (current) use of insulin: Secondary | ICD-10-CM | POA: Diagnosis not present

## 2015-04-21 DIAGNOSIS — M549 Dorsalgia, unspecified: Secondary | ICD-10-CM | POA: Diagnosis not present

## 2015-04-21 DIAGNOSIS — E785 Hyperlipidemia, unspecified: Secondary | ICD-10-CM | POA: Diagnosis not present

## 2015-04-21 DIAGNOSIS — I129 Hypertensive chronic kidney disease with stage 1 through stage 4 chronic kidney disease, or unspecified chronic kidney disease: Secondary | ICD-10-CM | POA: Diagnosis not present

## 2015-04-21 DIAGNOSIS — Z720 Tobacco use: Secondary | ICD-10-CM | POA: Diagnosis not present

## 2015-04-21 DIAGNOSIS — J441 Chronic obstructive pulmonary disease with (acute) exacerbation: Secondary | ICD-10-CM | POA: Diagnosis not present

## 2015-04-21 DIAGNOSIS — I5023 Acute on chronic systolic (congestive) heart failure: Secondary | ICD-10-CM | POA: Diagnosis not present

## 2015-04-22 ENCOUNTER — Encounter (HOSPITAL_COMMUNITY): Payer: Self-pay

## 2015-04-22 ENCOUNTER — Observation Stay (HOSPITAL_COMMUNITY)
Admission: EM | Admit: 2015-04-22 | Discharge: 2015-04-24 | Disposition: A | Discharge status: Home / Self Care | Payer: Medicare Other | Attending: Internal Medicine | Admitting: Internal Medicine

## 2015-04-22 ENCOUNTER — Emergency Department (HOSPITAL_COMMUNITY): Payer: Medicare Other

## 2015-04-22 DIAGNOSIS — Z7982 Long term (current) use of aspirin: Secondary | ICD-10-CM | POA: Diagnosis not present

## 2015-04-22 DIAGNOSIS — N183 Chronic kidney disease, stage 3 unspecified: Secondary | ICD-10-CM | POA: Diagnosis present

## 2015-04-22 DIAGNOSIS — Z5321 Procedure and treatment not carried out due to patient leaving prior to being seen by health care provider: Secondary | ICD-10-CM | POA: Diagnosis not present

## 2015-04-22 DIAGNOSIS — I10 Essential (primary) hypertension: Secondary | ICD-10-CM | POA: Diagnosis present

## 2015-04-22 DIAGNOSIS — Z681 Body mass index (BMI) 19 or less, adult: Secondary | ICD-10-CM | POA: Diagnosis not present

## 2015-04-22 DIAGNOSIS — I5022 Chronic systolic (congestive) heart failure: Secondary | ICD-10-CM | POA: Diagnosis not present

## 2015-04-22 DIAGNOSIS — E7800 Pure hypercholesterolemia, unspecified: Secondary | ICD-10-CM | POA: Diagnosis not present

## 2015-04-22 DIAGNOSIS — I953 Hypotension of hemodialysis: Secondary | ICD-10-CM | POA: Diagnosis not present

## 2015-04-22 DIAGNOSIS — Z794 Long term (current) use of insulin: Secondary | ICD-10-CM | POA: Diagnosis not present

## 2015-04-22 DIAGNOSIS — I420 Dilated cardiomyopathy: Secondary | ICD-10-CM | POA: Insufficient documentation

## 2015-04-22 DIAGNOSIS — Z992 Dependence on renal dialysis: Secondary | ICD-10-CM | POA: Diagnosis not present

## 2015-04-22 DIAGNOSIS — R0789 Other chest pain: Principal | ICD-10-CM | POA: Insufficient documentation

## 2015-04-22 DIAGNOSIS — Z79899 Other long term (current) drug therapy: Secondary | ICD-10-CM | POA: Insufficient documentation

## 2015-04-22 DIAGNOSIS — D509 Iron deficiency anemia, unspecified: Secondary | ICD-10-CM | POA: Diagnosis not present

## 2015-04-22 DIAGNOSIS — M1990 Unspecified osteoarthritis, unspecified site: Secondary | ICD-10-CM | POA: Insufficient documentation

## 2015-04-22 DIAGNOSIS — I132 Hypertensive heart and chronic kidney disease with heart failure and with stage 5 chronic kidney disease, or end stage renal disease: Secondary | ICD-10-CM | POA: Insufficient documentation

## 2015-04-22 DIAGNOSIS — J449 Chronic obstructive pulmonary disease, unspecified: Secondary | ICD-10-CM | POA: Insufficient documentation

## 2015-04-22 DIAGNOSIS — E43 Unspecified severe protein-calorie malnutrition: Secondary | ICD-10-CM | POA: Diagnosis not present

## 2015-04-22 DIAGNOSIS — I509 Heart failure, unspecified: Secondary | ICD-10-CM

## 2015-04-22 DIAGNOSIS — F17210 Nicotine dependence, cigarettes, uncomplicated: Secondary | ICD-10-CM | POA: Insufficient documentation

## 2015-04-22 DIAGNOSIS — R7989 Other specified abnormal findings of blood chemistry: Secondary | ICD-10-CM | POA: Diagnosis present

## 2015-04-22 DIAGNOSIS — Z9981 Dependence on supplemental oxygen: Secondary | ICD-10-CM | POA: Insufficient documentation

## 2015-04-22 DIAGNOSIS — R079 Chest pain, unspecified: Secondary | ICD-10-CM | POA: Diagnosis not present

## 2015-04-22 DIAGNOSIS — R778 Other specified abnormalities of plasma proteins: Secondary | ICD-10-CM | POA: Diagnosis present

## 2015-04-22 DIAGNOSIS — R748 Abnormal levels of other serum enzymes: Secondary | ICD-10-CM | POA: Insufficient documentation

## 2015-04-22 DIAGNOSIS — N186 End stage renal disease: Secondary | ICD-10-CM | POA: Insufficient documentation

## 2015-04-22 DIAGNOSIS — D849 Immunodeficiency, unspecified: Secondary | ICD-10-CM | POA: Insufficient documentation

## 2015-04-22 DIAGNOSIS — T8249XA Other complication of vascular dialysis catheter, initial encounter: Secondary | ICD-10-CM | POA: Diagnosis not present

## 2015-04-22 DIAGNOSIS — E119 Type 2 diabetes mellitus without complications: Secondary | ICD-10-CM

## 2015-04-22 DIAGNOSIS — E1122 Type 2 diabetes mellitus with diabetic chronic kidney disease: Secondary | ICD-10-CM | POA: Diagnosis not present

## 2015-04-22 DIAGNOSIS — J439 Emphysema, unspecified: Secondary | ICD-10-CM | POA: Diagnosis not present

## 2015-04-22 DIAGNOSIS — I959 Hypotension, unspecified: Secondary | ICD-10-CM | POA: Diagnosis not present

## 2015-04-22 DIAGNOSIS — R0602 Shortness of breath: Secondary | ICD-10-CM | POA: Diagnosis not present

## 2015-04-22 LAB — CBC
HEMATOCRIT: 45.7 % (ref 36.0–46.0)
Hemoglobin: 13.5 g/dL (ref 12.0–15.0)
MCH: 25.4 pg — ABNORMAL LOW (ref 26.0–34.0)
MCHC: 29.5 g/dL — ABNORMAL LOW (ref 30.0–36.0)
MCV: 85.9 fL (ref 78.0–100.0)
Platelets: 181 10*3/uL (ref 150–400)
RBC: 5.32 MIL/uL — AB (ref 3.87–5.11)
RDW: 22.9 % — AB (ref 11.5–15.5)
WBC: 5.1 10*3/uL (ref 4.0–10.5)

## 2015-04-22 LAB — COMPREHENSIVE METABOLIC PANEL
ALBUMIN: 3.9 g/dL (ref 3.5–5.0)
ALT: 27 U/L (ref 14–54)
AST: 37 U/L (ref 15–41)
Alkaline Phosphatase: 94 U/L (ref 38–126)
Anion gap: 11 (ref 5–15)
BUN: 34 mg/dL — ABNORMAL HIGH (ref 6–20)
CHLORIDE: 99 mmol/L — AB (ref 101–111)
CO2: 25 mmol/L (ref 22–32)
Calcium: 8.4 mg/dL — ABNORMAL LOW (ref 8.9–10.3)
Creatinine, Ser: 1.25 mg/dL — ABNORMAL HIGH (ref 0.44–1.00)
GFR calc Af Amer: 49 mL/min — ABNORMAL LOW (ref >60)
GFR calc non Af Amer: 42 mL/min — ABNORMAL LOW (ref >60)
Glucose, Bld: 93 mg/dL (ref 65–99)
POTASSIUM: 3.7 mmol/L (ref 3.5–5.1)
Sodium: 135 mmol/L (ref 135–145)
Total Bilirubin: 0.8 mg/dL (ref 0.3–1.2)
Total Protein: 7 g/dL (ref 6.5–8.1)

## 2015-04-22 LAB — PROTIME-INR
INR: 1.22 (ref 0.00–1.49)
Prothrombin Time: 15.6 seconds — ABNORMAL HIGH (ref 11.6–15.2)

## 2015-04-22 LAB — TROPONIN I: Troponin I: 0.04 ng/mL — ABNORMAL HIGH (ref <0.031)

## 2015-04-22 LAB — APTT: aPTT: 32 seconds (ref 24–37)

## 2015-04-22 NOTE — H&P (Signed)
PCP:  Jackie Plum, MD  Cardiology [facility]  Referring provider Hyacinth Meeker   Chief Complaint:  Chest pain, shortness of breath  HPI: [name] [name] is a 69 y.o. female   has a past medical history of Immune deficiency disorder (HCC); Hypertension; CHF (congestive heart failure) (HCC); Shortness of breath dyspnea; High cholesterol; Pneumonia; Chronic kidney disease (CKD), stage III (moderate); On home oxygen therapy; Type II diabetes mellitus (HCC); Chronic systolic CHF (congestive heart failure) (HCC); COPD (chronic obstructive pulmonary disease) (HCC); and Arthritis.   Presented with vague chest and back discomfort. Patient has hx of sever dialated nonischemic cardiomyopathy with EF 20% had recent admission to [facility]. Patietn went in respiratory distress was not able to be diuresed  currently on HD   on Tuesday, Thursday Saturday last treatment was today. Discharge weight was 112 she is 104 Lb today. Patient reports some low BP after her treatment which she states have happened in the pst. She has been having some headache and  reports some chest pain and back pain. This was at rest and seems vague. States she is chest pain free at this point.  Hospitalist was called for admission for chest pain in the setting of systolic heart falure  Review of Systems:    Pertinent positives include: chest pain, dyspnea on exertion,   Constitutional:  No weight loss, night sweats, Fevers, chills, fatigue, weight loss  HEENT:  No headaches, Difficulty swallowing,Tooth/dental problems,Sore throat,  No sneezing, itching, ear ache, nasal congestion, post nasal drip,  Cardio-vascular:  No  Orthopnea, PND, anasarca, dizziness, palpitations.no Bilateral lower extremity swelling  GI:  No heartburn, indigestion, abdominal pain, nausea, vomiting, diarrhea, change in bowel habits, loss of appetite, melena, blood in stool, hematemesis Resp:  no shortness of breath at rest. No No excess mucus, no  productive cough, No non-productive cough, No coughing up of blood.No change in color of mucus.No wheezing. Skin:  no rash or lesions. No jaundice GU:  no dysuria, change in color of urine, no urgency or frequency. No straining to urinate.  No flank pain.  Musculoskeletal:  No joint pain or no joint swelling. No decreased range of motion. No back pain.  Psych:  No change in mood or affect. No depression or anxiety. No memory loss.  Neuro: no localizing neurological complaints, no tingling, no weakness, no double vision, no gait abnormality, no slurred speech, no confusion  Otherwise ROS are negative except for above, 10 systems were reviewed  Past Medical History: Past Medical History  Diagnosis Date  . Immune deficiency disorder (HCC)   . Hypertension   . CHF (congestive heart failure) (HCC)   . Shortness of breath dyspnea   . High cholesterol   . Pneumonia     "back when I was little"  . Chronic kidney disease (CKD), stage III (moderate)     [name] [name] 08/01/2014  . On home oxygen therapy     "2-3L; 24/7" (08/01/2014)  . Type II diabetes mellitus (HCC)   . Chronic systolic CHF (congestive heart failure) (HCC)     [name] [name] 08/01/2014  . COPD (chronic obstructive pulmonary disease) (HCC)     "severe" /notes 08/01/2014  . Arthritis     legs, shoulders (08/01/2014)   Past Surgical History  Procedure Laterality Date  . Breast surgery Right     "probably was a boil"     Medications: Prior to Admission medications   Medication Sig Start Date End Date Taking? Authorizing Provider  albuterol (PROVENTIL HFA;VENTOLIN HFA) 108 (90  BASE) MCG/ACT inhaler Inhale 1 puff into the lungs every 6 (six) hours as needed for shortness of breath.   Yes Historical Provider, MD  aspirin 325 MG EC tablet Take 325 mg by mouth daily.   Yes Historical Provider, MD  carvedilol (COREG) 6.25 MG tablet Take 1 tablet (6.25 mg total) by mouth 2 (two) times daily with a meal. 08/05/14  Yes Estela Isaiah Blakes, MD  famotidine (PEPCID) 20 MG tablet Take 20 mg by mouth 2 (two) times daily.   Yes Historical Provider, MD  Fluticasone-Salmeterol (ADVAIR) 250-50 MCG/DOSE AEPB Inhale 1 puff into the lungs 2 (two) times daily.   Yes Historical Provider, MD  gabapentin (NEURONTIN) 300 MG capsule Take 300 mg by mouth 2 (two) times daily.   Yes Historical Provider, MD  insulin regular (HUMULIN R) 100 units/mL injection Inject 0-24 Units into the skin 3 (three) times daily before meals.   Yes Historical Provider, MD  rosuvastatin (CRESTOR) 40 MG tablet Take 40 mg by mouth daily.   Yes Historical Provider, MD  torsemide (DEMADEX) 20 MG tablet Take 20 mg by mouth 2 (two) times daily.   Yes Historical Provider, MD  furosemide (LASIX) 40 MG tablet Take 1 tablet (40 mg total) by mouth 2 (two) times daily. Patient not taking: Reported on 04/22/2015 08/05/14   Henderson Cloud, MD  lisinopril (PRINIVIL,ZESTRIL) 5 MG tablet Take 1 tablet (5 mg total) by mouth daily. Patient not taking: Reported on 04/22/2015 08/06/14   Henderson Cloud, MD  potassium chloride (K-DUR,KLOR-CON) 10 MEQ tablet Take 1 tablet (10 mEq total) by mouth 2 (two) times daily. Patient not taking: Reported on 04/22/2015 08/05/14   Henderson Cloud, MD    Allergies:  No Known Allergies  Social History:  Ambulatory  walker   Lives at home  With family    reports that she has been smoking Cigarettes.  She has a 27.5 pack-year smoking history. She has never used smokeless tobacco. She reports that she drinks alcohol. She reports that she uses illicit drugs (Marijuana).    Family History: family history includes Other in her father and mother.    Physical Exam: Patient Vitals for the past 24 hrs:  BP Temp Temp src Pulse Resp SpO2 Height Weight  04/22/15 2200 93/64 mmHg - - 74 21 97 % - -  04/22/15 2145 99/62 mmHg - - 80 21 97 % - -  04/22/15 2138 - - - - - - 5\' 3"  (1.6 m) 47.5 kg (104 lb 11.5 oz)  04/22/15 2130  103/65 mmHg - - - 22 - - -  04/22/15 2100 92/62 mmHg - - (!) 41 19 97 % - -  04/22/15 2015 94/58 mmHg - - - 21 - - -  04/22/15 2004 (!) 84/58 mmHg 98.3 F (36.8 C) Oral 84 22 97 % - -    1. General:  in No Acute distress 2. Psychological: Alert and  Oriented 3. Head/ENT:    Dry Mucous Membranes                          Head Non traumatic, neck supple                           Poor Dentition 4. SKIN:  decreased Skin turgor,  Skin clean Dry and intact no rash 5. Heart: Regular rate and rhythm no Murmur, Rub or  gallop 6. Lungs: Clear to auscultation bilaterally, no wheezes or crackles   7. Abdomen: Soft, non-tender, Non distended 8. Lower extremities: no clubbing, cyanosis, or edema 9. Neurologically Grossly intact, moving all 4 extremities equally 10. MSK: Normal range of motion  body mass index is 18.55 kg/(m^2).   Labs on Admission:   Results for orders placed or performed during the hospital encounter of 04/22/15 (from the past 24 hour(s))  APTT     Status: None   Collection Time: 04/22/15  8:40 PM  Result Value Ref Range   aPTT 32 24 - 37 seconds  CBC     Status: Abnormal   Collection Time: 04/22/15  8:40 PM  Result Value Ref Range   WBC 5.1 4.0 - 10.5 K/uL   RBC 5.32 (H) 3.87 - 5.11 MIL/uL   Hemoglobin 13.5 12.0 - 15.0 g/dL   HCT 54.0 98.1 - 19.1 %   MCV 85.9 78.0 - 100.0 fL   MCH 25.4 (L) 26.0 - 34.0 pg   MCHC 29.5 (L) 30.0 - 36.0 g/dL   RDW 47.8 (H) 29.5 - 62.1 %   Platelets 181 150 - 400 K/uL  Comprehensive metabolic panel     Status: Abnormal   Collection Time: 04/22/15  8:40 PM  Result Value Ref Range   Sodium 135 135 - 145 mmol/L   Potassium 3.7 3.5 - 5.1 mmol/L   Chloride 99 (L) 101 - 111 mmol/L   CO2 25 22 - 32 mmol/L   Glucose, Bld 93 65 - 99 mg/dL   BUN 34 (H) 6 - 20 mg/dL   Creatinine, Ser 3.08 (H) 0.44 - 1.00 mg/dL   Calcium 8.4 (L) 8.9 - 10.3 mg/dL   Total Protein 7.0 6.5 - 8.1 g/dL   Albumin 3.9 3.5 - 5.0 g/dL   AST 37 15 - 41 U/L   ALT 27 14 -  54 U/L   Alkaline Phosphatase 94 38 - 126 U/L   Total Bilirubin 0.8 0.3 - 1.2 mg/dL   GFR calc non Af Amer 42 (L) >60 mL/min   GFR calc Af Amer 49 (L) >60 mL/min   Anion gap 11 5 - 15  Protime-INR     Status: Abnormal   Collection Time: 04/22/15  8:40 PM  Result Value Ref Range   Prothrombin Time 15.6 (H) 11.6 - 15.2 seconds   INR 1.22 0.00 - 1.49  Troponin I     Status: Abnormal   Collection Time: 04/22/15  8:40 PM  Result Value Ref Range   Troponin I 0.04 (H) <0.031 ng/mL    UA odered  Lab Results  Component Value Date   HGBA1C 6.9* 06/22/2014    Estimated Creatinine Clearance: 31 mL/min (by C-G formula based on Cr of 1.25).  BNP (last 3 results)  Recent Labs  06/21/14 0229  PROBNP 28259.0*    Other results:  I have pearsonaly reviewed this: ECG REPORT  Rate: 778  Rhythm: Sinus with PAC ST&T Change: inverted t waves in V5 v6 QTC 446  Filed Weights   04/22/15 2138  Weight: 47.5 kg (104 lb 11.5 oz)     Cultures:    Component Value Date/Time   SDES URINE, CLEAN CATCH 01/09/2014 2128   SPECREQUEST NONE 01/09/2014 2128   CULT NO GROWTH Performed at [facility] 01/09/2014 2128   REPTSTATUS 01/11/2014 FINAL 01/09/2014 2128     Radiological Exams on Admission: Dg Chest Portable 1 View  04/22/2015   CLINICAL DATA:  Shortness of  breath, chest pain.  EXAM: PORTABLE CHEST 1 VIEW  COMPARISON:  04/22/2015  FINDINGS: Dual lumen right central venous catheter has unchanged, tip at the expected location of superior vena cava/ right atrium.  Cardiomediastinal silhouette is markedly enlarged. Mediastinal contours appear intact. Aortic knob calcifications are noted.  There is no evidence of focal airspace consolidation or pneumothorax. Left pleural effusion cannot be excluded, as the left costophrenic angle is obscured. There is mild prominence of the interstitial markings.  Osseous structures are without acute abnormality. Soft tissues are grossly normal.   IMPRESSION: Marked enlargement of the cardiac silhouette. This may represent cardiomegaly and/ or pericardial effusion.  Mild increase of the interstitial markings, likely due to mild pulmonary edema.  Left pleural effusion cannot be excluded, as the left costophrenic angle is obscured.   Electronically Signed   By: Ted Mcalpine M.D.   On: 04/22/2015 20:48    Chart has been reviewed  Family not  at  Bedside   Assessment/Plan  70 yo F wuith hx of NICM with EF of 20% here with atypical chest pain and transient hypotension now improved  Present on Admission:   . Chest pain - - given risk factors will admit, monitor on telemetry, cycle cardiac enzymes, obtain serial ECG. Further risk stratify with lipid panel, hgA1C, obtain TSH. Make sure patient is on Aspirin. Further treatment based on the currently pending results.  . Hypotension - currently improved possibly secondary to recent dialysis: The patient is symptomatic to hold off on IV fluids give a history of severe systolic heart failure. Currently appears to be stable  . COPD (chronic obstructive pulmonary disease) (HCC) continue home medication  . Hypertension hold blood pressure medications  . Protein-calorie malnutrition, severe (HCC) check prealbumin as needed order nutritional consult  . CKD (chronic kidney disease) stage 3, GFR 30-59 ml/min - chronic currently on hemodialysis for diuresis purposes started while hospitalized at [facility]. We'll need to notify nephrology in the morning if patient is still here on Tuesday  . Elevated troponin likely multifactorial secondary to history of heart failure as well as renal insufficiency. We'll continue to cycle at this point no EKG changes. Chest pain was atypical and currently resolved. Continue to monitor     Prophylaxis:  Lovenox   CODE STATUS:  FULL CODE   as per patient   Disposition:    To home once workup is complete and patient is stable  Other plan as per orders.  I have  spent a total of on this admission  Alexei Ey 04/22/2015, 11:16 PM  [facility]  Pager [phone]   after 2 AM please page floor coverage PA If 7AM-7PM, please contact the day team taking care of the patient  [email or web address]  Password [email or web address]

## 2015-04-22 NOTE — ED Notes (Signed)
Admitting MD in room.

## 2015-04-22 NOTE — ED Provider Notes (Signed)
CSN: [id number]     Arrival date & time 04/22/15  1945 History   First MD Initiated Contact with Patient 04/22/15 1945     Chief Complaint  Patient presents with  . Chest Pain     (Consider location/radiation/quality/duration/timing/severity/associated sxs/prior Treatment) HPI  The patient is a 69 year old female, she presents to this hospital by ambulance after she was picked up at her house. She was in a car outside after being transported home from [facility]. I have discussed her care with the physician at that hospital who states that she was in the waiting room, there is a triage note stating that she had been complaining of some chest pain today, it had gradually improved but was still present, the patient states that she became upset that she had to wait in the waiting room so she left and came to this hospital instead. She reports that she has had 2 straight days of pain located in the right hip, the right side and the chest diffusely. She states that she is short of breath. Of note the patient does have chronic congestive heart failure and according to [facility] records via the emergency department physician at this time the patient has an ejection fraction of 20%, was recently admitted for 22 days because of acute on chronic heart failure and a dilated cardiomyopathy and had new onset acute renal failure started on dialysis on October 3 and has had dialysis on the fifth and then again today. She does have home health. The patient refuses to give me any more information and just states that it hurts all over.    Past Medical History  Diagnosis Date  . Immune deficiency disorder (HCC)   . Hypertension   . CHF (congestive heart failure) (HCC)   . Shortness of breath dyspnea   . High cholesterol   . Pneumonia     "back when I was little"  . Chronic kidney disease (CKD), stage III (moderate)     [name] 08/01/2014  . On home oxygen therapy    "2-3L; 24/7" (08/01/2014)  . Type II diabetes mellitus (HCC)   . Chronic systolic CHF (congestive heart failure) (HCC)     [name] 08/01/2014  . COPD (chronic obstructive pulmonary disease) (HCC)     "severe" /notes 08/01/2014  . Arthritis     legs, shoulders (08/01/2014)   Past Surgical History  Procedure Laterality Date  . Breast surgery Right     "probably was a boil"   Family History  Problem Relation Age of Onset  . Other Mother     Per patient died from old age.  . Other Father     Per patient died from old age.  . Diabetes type II Neg Hx   . Hypertension Neg Hx    Social History  Substance Use Topics  . Smoking status: Current Every Day Smoker -- 0.50 packs/day for 55 years    Types: Cigarettes  . Smokeless tobacco: Never Used  . Alcohol Use: No     Comment: reports not now   OB History    No data available     Review of Systems  All other systems reviewed and are negative.     Allergies  Review of patient's allergies indicates no known allergies.  Home Medications   Prior to Admission medications   Medication Sig Start Date End Date Taking? Authorizing Provider  albuterol (PROVENTIL HFA;VENTOLIN HFA) 108 (90 BASE) MCG/ACT inhaler Inhale 1 puff  into the lungs every 6 (six) hours as needed for shortness of breath.   Yes Historical Provider, MD  aspirin 325 MG EC tablet Take 325 mg by mouth daily.   Yes Historical Provider, MD  carvedilol (COREG) 6.25 MG tablet Take 1 tablet (6.25 mg total) by mouth 2 (two) times daily with a meal. 08/05/14  Yes Estela Isaiah Blakes, MD  famotidine (PEPCID) 20 MG tablet Take 20 mg by mouth 2 (two) times daily.   Yes Historical Provider, MD  Fluticasone-Salmeterol (ADVAIR) 250-50 MCG/DOSE AEPB Inhale 1 puff into the lungs 2 (two) times daily.   Yes Historical Provider, MD  gabapentin (NEURONTIN) 300 MG capsule Take 300 mg by mouth 2 (two) times daily.   Yes Historical Provider, MD  insulin regular (HUMULIN R) 100 units/mL  injection Inject 0-24 Units into the skin 3 (three) times daily before meals.   Yes Historical Provider, MD  rosuvastatin (CRESTOR) 40 MG tablet Take 40 mg by mouth daily.   Yes Historical Provider, MD  torsemide (DEMADEX) 20 MG tablet Take 20 mg by mouth 2 (two) times daily.   Yes Historical Provider, MD  furosemide (LASIX) 40 MG tablet Take 1 tablet (40 mg total) by mouth 2 (two) times daily. Patient not taking: Reported on 04/22/2015 08/05/14   Henderson Cloud, MD  lisinopril (PRINIVIL,ZESTRIL) 5 MG tablet Take 1 tablet (5 mg total) by mouth daily. Patient not taking: Reported on 04/22/2015 08/06/14   Henderson Cloud, MD  potassium chloride (K-DUR,KLOR-CON) 10 MEQ tablet Take 1 tablet (10 mEq total) by mouth 2 (two) times daily. Patient not taking: Reported on 04/22/2015 08/05/14   Henderson Cloud, MD   BP 101/69 mmHg  Pulse 55  Temp(Src) 98.3 F (36.8 C) (Oral)  Resp 19  Ht  (1.6 m)  Wt 104 lb 11.5 oz (47.5 kg)  BMI 18.55 kg/m2  SpO2 98% Physical Exam  Constitutional: She appears well-developed and well-nourished. No distress.  HENT:  Head: Normocephalic and atraumatic.  Mouth/Throat: Oropharynx is clear and moist. No oropharyngeal exudate.  Eyes: Conjunctivae and EOM are normal. Pupils are equal, round, and reactive to light. Right eye exhibits no discharge. Left eye exhibits no discharge. No scleral icterus.  Neck: Normal range of motion. Neck supple. No JVD present. No thyromegaly present.  Cardiovascular: Normal rate, regular rhythm, normal heart sounds and intact distal pulses.  Exam reveals no gallop and no friction rub.   No murmur heard. Pulmonary/Chest: No respiratory distress. She has no wheezes. She has rales ( Soft scattered rales bilaterally).  Mild tachypnea. Port-A-Cath in the right upper chest wall appears clean, no drainage discharge redness or swelling  Abdominal: Soft. Bowel sounds are normal. She exhibits no distension and no mass.  There is no tenderness.  Nontender abdomen  Musculoskeletal: Normal range of motion. She exhibits no edema or tenderness.  No rashes or redness over the joints, good range of motion of the bilateral hips and knees  Lymphadenopathy:    She has no cervical adenopathy.  Neurological: She is alert. Coordination normal.  Skin: Skin is warm and dry. No rash noted. No erythema.  Psychiatric: She has a normal mood and affect. Her behavior is normal.  Nursing note and vitals reviewed.   ED Course  Procedures (including critical care time) Labs Review Labs Reviewed  CBC - Abnormal; Notable for the following:    RBC 5.32 (*)    MCH 25.4 (*)    MCHC 29.5 (*)  RDW 22.9 (*)    All other components within normal limits  COMPREHENSIVE METABOLIC PANEL - Abnormal; Notable for the following:    Chloride 99 (*)    BUN 34 (*)    Creatinine, Ser 1.25 (*)    Calcium 8.4 (*)    GFR calc non Af Amer 42 (*)    GFR calc Af Amer 49 (*)    All other components within normal limits  PROTIME-INR - Abnormal; Notable for the following:    Prothrombin Time 15.6 (*)    All other components within normal limits  TROPONIN I - Abnormal; Notable for the following:    Troponin I 0.04 (*)    All other components within normal limits  APTT  PREALBUMIN  URINALYSIS, ROUTINE W REFLEX MICROSCOPIC (NOT AT [facility])  TROPONIN I  TROPONIN I  TROPONIN I  MAGNESIUM  PHOSPHORUS  TSH  COMPREHENSIVE METABOLIC PANEL  CBC  CBC  CREATININE, SERUM  TROPONIN I    Imaging Review Dg Chest Portable 1 View  04/22/2015   CLINICAL DATA:  Shortness of breath, chest pain.  EXAM: PORTABLE CHEST 1 VIEW  COMPARISON:  04/22/2015  FINDINGS: Dual lumen right central venous catheter has unchanged, tip at the expected location of superior vena cava/ right atrium.  Cardiomediastinal silhouette is markedly enlarged. Mediastinal contours appear intact. Aortic knob calcifications are noted.  There is no evidence of focal airspace  consolidation or pneumothorax. Left pleural effusion cannot be excluded, as the left costophrenic angle is obscured. There is mild prominence of the interstitial markings.  Osseous structures are without acute abnormality. Soft tissues are grossly normal.  IMPRESSION: Marked enlargement of the cardiac silhouette. This may represent cardiomegaly and/ or pericardial effusion.  Mild increase of the interstitial markings, likely due to mild pulmonary edema.  Left pleural effusion cannot be excluded, as the left costophrenic angle is obscured.   Electronically Signed   By: Ted Mcalpine M.D.   On: 04/22/2015 20:48   I have personally reviewed and evaluated these images and lab results as part of my medical decision-making.   EKG Interpretation   Date/Time:  Saturday April 22 2015 20:00:45 EDT Ventricular Rate:  78 PR Interval:  160 QRS Duration: 79 QT Interval:  388 QTC Calculation: 442 R Axis:   -39 Text Interpretation:  Sinus rhythm Atrial premature complexes Probable  left atrial enlargement LVH with secondary repolarization abnormality  Since last tracing voltages increased, LVH with repolarization  abnormalities still present Confirmed by Geofrey Silliman  MD, Kalynn Declercq ([id number]) on  04/22/2015 8:46:04 PM      MDM   Final diagnoses:  Hypotension of hemodialysis  Other chest pain    The patient has hypotension, requiring oxygen though the patient does have oxygen requirement at baseline and uses oxygen at home. We'll obtain a chest x-ray labs, the patient is frustrated by her recent downturn in her health which may be prompting her new symptoms however given chest pain shortness of breath and hypotension she will need a further evaluation. Suspect inpatient evaluation will be needed  The patient did receive aspirin at the other hospital prior to her transport to this hospital. With hypotension will avoid nitroglycerin. In addition her prior medical record suggests that she has a nonischemic  cardiomyopathy.  The pt has abnormal BP consistently hypotensive - she will be admitted for observation  D/w Dr. Adela Glimpse who is in agreement.    Eber Hong, MD 04/23/15 0005

## 2015-04-22 NOTE — ED Notes (Addendum)
Admitting MD out of room.

## 2015-04-22 NOTE — ED Notes (Addendum)
Per EMS, pt was seen at [facility] and was checked for initial complaint of CP, and didn't like how she was treated at [facility], drove home and then called EMS to bring her here. 12 Lead unremarkable, Pt's complaint now is generalized CP and abd pain worse with inspiration. BP 120/60, CBG 153, pt wears o2 all the time and 98% on 2L, temporal 97.3. Pt alert and oriented x 4. Pt now states that she hurts down her right side. MD in room. Vague story. Pt is newly dialysis pt as of 10/03. Pt has hx of kidney problems and chf.

## 2015-04-23 DIAGNOSIS — R079 Chest pain, unspecified: Secondary | ICD-10-CM | POA: Diagnosis not present

## 2015-04-23 DIAGNOSIS — I953 Hypotension of hemodialysis: Secondary | ICD-10-CM | POA: Diagnosis not present

## 2015-04-23 DIAGNOSIS — E43 Unspecified severe protein-calorie malnutrition: Secondary | ICD-10-CM

## 2015-04-23 DIAGNOSIS — I132 Hypertensive heart and chronic kidney disease with heart failure and with stage 5 chronic kidney disease, or end stage renal disease: Secondary | ICD-10-CM | POA: Diagnosis not present

## 2015-04-23 DIAGNOSIS — E1122 Type 2 diabetes mellitus with diabetic chronic kidney disease: Secondary | ICD-10-CM | POA: Diagnosis not present

## 2015-04-23 DIAGNOSIS — R0789 Other chest pain: Secondary | ICD-10-CM | POA: Diagnosis not present

## 2015-04-23 DIAGNOSIS — E119 Type 2 diabetes mellitus without complications: Secondary | ICD-10-CM

## 2015-04-23 LAB — COMPREHENSIVE METABOLIC PANEL
ALK PHOS: 82 U/L (ref 38–126)
ALT: 27 U/L (ref 14–54)
AST: 39 U/L (ref 15–41)
Albumin: 3.5 g/dL (ref 3.5–5.0)
Anion gap: 15 (ref 5–15)
BILIRUBIN TOTAL: 0.7 mg/dL (ref 0.3–1.2)
BUN: 40 mg/dL — AB (ref 6–20)
CALCIUM: 8.4 mg/dL — AB (ref 8.9–10.3)
CHLORIDE: 100 mmol/L — AB (ref 101–111)
CO2: 24 mmol/L (ref 22–32)
Creatinine, Ser: 1.25 mg/dL — ABNORMAL HIGH (ref 0.44–1.00)
GFR, EST AFRICAN AMERICAN: 49 mL/min — AB (ref >60)
GFR, EST NON AFRICAN AMERICAN: 42 mL/min — AB (ref >60)
Glucose, Bld: 161 mg/dL — ABNORMAL HIGH (ref 65–99)
Potassium: 3.8 mmol/L (ref 3.5–5.1)
Sodium: 139 mmol/L (ref 135–145)
TOTAL PROTEIN: 6.6 g/dL (ref 6.5–8.1)

## 2015-04-23 LAB — URINALYSIS, ROUTINE W REFLEX MICROSCOPIC
BILIRUBIN URINE: NEGATIVE
Glucose, UA: NEGATIVE mg/dL
Ketones, ur: NEGATIVE mg/dL
NITRITE: NEGATIVE
PROTEIN: NEGATIVE mg/dL
SPECIFIC GRAVITY, URINE: 1.011 (ref 1.005–1.030)
UROBILINOGEN UA: 0.2 mg/dL (ref 0.0–1.0)
pH: 5.5 (ref 5.0–8.0)

## 2015-04-23 LAB — TROPONIN I
TROPONIN I: 0.04 ng/mL — AB (ref <0.031)
TROPONIN I: 0.03 ng/mL (ref <0.031)

## 2015-04-23 LAB — GLUCOSE, CAPILLARY
GLUCOSE-CAPILLARY: 131 mg/dL — AB (ref 65–99)
GLUCOSE-CAPILLARY: 155 mg/dL — AB (ref 65–99)
GLUCOSE-CAPILLARY: 53 mg/dL — AB (ref 65–99)
Glucose-Capillary: 137 mg/dL — ABNORMAL HIGH (ref 65–99)
Glucose-Capillary: 137 mg/dL — ABNORMAL HIGH (ref 65–99)

## 2015-04-23 LAB — TSH: TSH: 0.666 u[IU]/mL (ref 0.350–4.500)

## 2015-04-23 LAB — URINE MICROSCOPIC-ADD ON

## 2015-04-23 LAB — MAGNESIUM: Magnesium: 1.9 mg/dL (ref 1.7–2.4)

## 2015-04-23 LAB — CBC
HCT: 43.8 % (ref 36.0–46.0)
Hemoglobin: 12.9 g/dL (ref 12.0–15.0)
MCH: 25.2 pg — ABNORMAL LOW (ref 26.0–34.0)
MCHC: 29.5 g/dL — ABNORMAL LOW (ref 30.0–36.0)
MCV: 85.7 fL (ref 78.0–100.0)
PLATELETS: 169 10*3/uL (ref 150–400)
RBC: 5.11 MIL/uL (ref 3.87–5.11)
RDW: 23.1 % — AB (ref 11.5–15.5)
WBC: 4.8 10*3/uL (ref 4.0–10.5)

## 2015-04-23 LAB — PREALBUMIN: Prealbumin: 26.1 mg/dL (ref 18–38)

## 2015-04-23 LAB — PHOSPHORUS: Phosphorus: 5.1 mg/dL — ABNORMAL HIGH (ref 2.5–4.6)

## 2015-04-23 MED ORDER — ASPIRIN EC 325 MG PO TBEC
325.0000 mg | DELAYED_RELEASE_TABLET | Freq: Every day | ORAL | Status: DC
  Administered 2015-04-23 – 2015-04-24 (×2): 325 mg via ORAL
  Filled 2015-04-23 (×3): qty 1

## 2015-04-23 MED ORDER — MOMETASONE FURO-FORMOTEROL FUM 100-5 MCG/ACT IN AERO
2.0000 | INHALATION_SPRAY | Freq: Two times a day (BID) | RESPIRATORY_TRACT | Status: DC
  Administered 2015-04-23 (×3): 2 via RESPIRATORY_TRACT
  Filled 2015-04-23 (×2): qty 8.8

## 2015-04-23 MED ORDER — ACETAMINOPHEN 650 MG RE SUPP: 650.0000 mg | Freq: Four times a day (QID) | RECTAL | Status: DC | PRN

## 2015-04-23 MED ORDER — ONDANSETRON HCL 4 MG PO TABS: 4.0000 mg | ORAL_TABLET | Freq: Four times a day (QID) | ORAL | Status: DC | PRN

## 2015-04-23 MED ORDER — ALBUTEROL SULFATE (2.5 MG/3ML) 0.083% IN NEBU: 3.0000 mL | INHALATION_SOLUTION | Freq: Four times a day (QID) | RESPIRATORY_TRACT | Status: DC | PRN

## 2015-04-23 MED ORDER — INSULIN ASPART 100 UNIT/ML ~~LOC~~ SOLN
0.0000 [IU] | Freq: Three times a day (TID) | SUBCUTANEOUS | Status: DC
  Administered 2015-04-23 (×3): 1 [IU] via SUBCUTANEOUS
  Administered 2015-04-24: 3 [IU] via SUBCUTANEOUS
  Administered 2015-04-24: 1 [IU] via SUBCUTANEOUS

## 2015-04-23 MED ORDER — GABAPENTIN 300 MG PO CAPS
300.0000 mg | ORAL_CAPSULE | Freq: Two times a day (BID) | ORAL | Status: DC
  Administered 2015-04-23 (×3): 300 mg via ORAL
  Filled 2015-04-23 (×4): qty 1

## 2015-04-23 MED ORDER — SODIUM CHLORIDE 0.9 % IJ SOLN
3.0000 mL | Freq: Two times a day (BID) | INTRAMUSCULAR | Status: DC
  Administered 2015-04-23: 3 mL via INTRAVENOUS

## 2015-04-23 MED ORDER — ACETAMINOPHEN 325 MG PO TABS: 650.0000 mg | ORAL_TABLET | Freq: Four times a day (QID) | ORAL | Status: DC | PRN

## 2015-04-23 MED ORDER — TORSEMIDE 20 MG PO TABS: 20.0000 mg | ORAL_TABLET | Freq: Two times a day (BID) | ORAL | Status: DC

## 2015-04-23 MED ORDER — HEPARIN SODIUM (PORCINE) 5000 UNIT/ML IJ SOLN
5000.0000 [IU] | Freq: Three times a day (TID) | INTRAMUSCULAR | Status: DC
  Administered 2015-04-23 – 2015-04-24 (×5): 5000 [IU] via SUBCUTANEOUS
  Filled 2015-04-23 (×5): qty 1

## 2015-04-23 MED ORDER — SODIUM CHLORIDE 0.9 % IV SOLN: 250.0000 mL | INTRAVENOUS | Status: DC | PRN

## 2015-04-23 MED ORDER — SODIUM CHLORIDE 0.9 % IJ SOLN: 3.0000 mL | INTRAMUSCULAR | Status: DC | PRN

## 2015-04-23 MED ORDER — SODIUM CHLORIDE 0.9 % IJ SOLN
3.0000 mL | Freq: Two times a day (BID) | INTRAMUSCULAR | Status: DC
  Administered 2015-04-23 (×3): 3 mL via INTRAVENOUS

## 2015-04-23 MED ORDER — FAMOTIDINE 20 MG PO TABS
20.0000 mg | ORAL_TABLET | Freq: Two times a day (BID) | ORAL | Status: DC
  Administered 2015-04-23 – 2015-04-24 (×4): 20 mg via ORAL
  Filled 2015-04-23 (×3): qty 1

## 2015-04-23 MED ORDER — INSULIN ASPART 100 UNIT/ML ~~LOC~~ SOLN: 0.0000 [IU] | Freq: Every day | SUBCUTANEOUS | Status: DC

## 2015-04-23 MED ORDER — ONDANSETRON HCL 4 MG/2ML IJ SOLN: 4.0000 mg | Freq: Four times a day (QID) | INTRAMUSCULAR | Status: DC | PRN

## 2015-04-23 MED ORDER — ONDANSETRON HCL 4 MG/2ML IJ SOLN: 4.0000 mg | Freq: Three times a day (TID) | INTRAMUSCULAR | Status: DC | PRN

## 2015-04-23 MED ORDER — ROSUVASTATIN CALCIUM 40 MG PO TABS
40.0000 mg | ORAL_TABLET | Freq: Every day | ORAL | Status: DC
  Administered 2015-04-23 – 2015-04-24 (×2): 40 mg via ORAL
  Filled 2015-04-23 (×3): qty 1

## 2015-04-23 NOTE — Progress Notes (Signed)
[facility] PROGRESS NOTE  [name] [name] ZOX:[id number] DOB: 03-10-1946 DOA: 04/22/2015  PCP: Jackie Plum, MD  Brief HPI: 69 year old African-American female with a past medical history of hypertension, chronic systolic congestive heart failure, end-stage renal disease recently started on dialysis, type 2 diabetes, presented with chest pain, back pain and upper abdominal pain. She was noted to be hypotensive in the emergency department. She was hospitalized for further management.  Past medical history:  Past Medical History  Diagnosis Date  . Immune deficiency disorder (HCC)   . Hypertension   . CHF (congestive heart failure) (HCC)   . Shortness of breath dyspnea   . High cholesterol   . Pneumonia     "back when I was little"  . Chronic kidney disease (CKD), stage III (moderate)     [name] 08/01/2014  . On home oxygen therapy     "2-3L; 24/7" (08/01/2014)  . Type II diabetes mellitus (HCC)   . Chronic systolic CHF (congestive heart failure) (HCC)     [name] 08/01/2014  . COPD (chronic obstructive pulmonary disease) (HCC)     "severe" /notes 08/01/2014  . Arthritis     legs, shoulders (08/01/2014)    Consultants: None  Procedures: None  Antibiotics: None  Subjective: Patient feels better this morning. Denies any chest pain or shortness of breath. No dizziness or lightheadedness. No nausea or vomiting. Symptoms have resolved.  Objective: Vital Signs  Filed Vitals:   04/22/15 2330 04/23/15 0013 04/23/15 0500 04/23/15 0800  BP: 101/69 104/71 92/67 100/61  Pulse: 55  79 89  Temp:  97.7 F (36.5 C) 97.8 F (36.6 C) 97.7 F (36.5 C)  TempSrc:  Oral Oral Oral  Resp: Height:   (1.549 m)    Weight:  47.356 kg (104 lb 6.4 oz)    SpO2: 98% 99% 96% 96%    Intake/Output Summary (Last 24 hours) at 04/23/15 1210 Last data filed at 04/23/15 1000  Gross per 24 hour  Intake    240 ml  Output    400 ml  Net   -160 ml   Filed Weights   04/22/15 2138 04/23/15 0013  Weight: 47.5 kg (104 lb 11.5 oz) 47.356 kg (104 lb 6.4 oz)    General appearance: alert, cooperative, appears stated age and no distress Resp: clear to auscultation bilaterally Cardio: regular rate and rhythm, S1, S2 normal, no murmur, click, rub or gallop GI: soft, non-tender; bowel sounds normal; no masses,  no organomegaly Extremities: extremities normal, atraumatic, no cyanosis or edema Neurologic: Alert and oriented 3. No facial asymmetry. Tongue is midline. No pronator drift. Motor strength is equal bilateral upper and lower extremities.  Lab Results:  Basic Metabolic Panel:  Recent Labs Lab 04/22/15 2040 04/23/15 0146  NA 135 139  K 3.7 3.8  CL 99* 100*  CO2 25 24  GLUCOSE 93 161*  BUN 34* 40*  CREATININE 1.25* 1.25*  CALCIUM 8.4* 8.4*  MG  --  1.9  PHOS  --  5.1*   Liver Function Tests:  Recent Labs Lab 04/22/15 2040 04/23/15 0146  AST 37 39  ALT 27 27  ALKPHOS 94 82  BILITOT 0.8 0.7  PROT 7.0 6.6  ALBUMIN 3.9 3.5   CBC:  Recent Labs Lab 04/22/15 2040 04/23/15 0146  WBC 5.1 4.8  HGB 13.5 12.9  HCT 45.7 43.8  MCV 85.9 85.7  PLT 181 169   Cardiac Enzymes:  Recent Labs Lab 04/22/15 2040 04/23/15 0146 04/23/15  0603  TROPONINI 0.04* 0.04* 0.03   BNP (last 3 results)  Recent Labs  07/30/14 0129  BNP >4500.0*    CBG:  Recent Labs Lab 04/23/15 0035 04/23/15 0140 04/23/15 0644  GLUCAP 53* 155* 137*    No results found for this or any previous visit (from the past 240 hour(s)).    Studies/Results: Dg Chest Portable 1 View  04/22/2015   CLINICAL DATA:  Shortness of breath, chest pain.  EXAM: PORTABLE CHEST 1 VIEW  COMPARISON:  04/22/2015  FINDINGS: Dual lumen right central venous catheter has unchanged, tip at the expected location of superior vena cava/ right atrium.  Cardiomediastinal silhouette is markedly enlarged. Mediastinal contours appear intact. Aortic knob calcifications are noted.  There is no  evidence of focal airspace consolidation or pneumothorax. Left pleural effusion cannot be excluded, as the left costophrenic angle is obscured. There is mild prominence of the interstitial markings.  Osseous structures are without acute abnormality. Soft tissues are grossly normal.  IMPRESSION: Marked enlargement of the cardiac silhouette. This may represent cardiomegaly and/ or pericardial effusion.  Mild increase of the interstitial markings, likely due to mild pulmonary edema.  Left pleural effusion cannot be excluded, as the left costophrenic angle is obscured.   Electronically Signed   By: Ted Mcalpine M.D.   On: 04/22/2015 20:48    Medications:  Scheduled: . aspirin  325 mg Oral Daily  . famotidine  20 mg Oral BID  . gabapentin  300 mg Oral BID  . heparin  5,000 Units Subcutaneous 3 times per day  . insulin aspart  0-5 Units Subcutaneous QHS  . insulin aspart  0-9 Units Subcutaneous TID WC  . mometasone-formoterol  2 puff Inhalation BID  . rosuvastatin  40 mg Oral Daily  . sodium chloride  3 mL Intravenous Q12H  . sodium chloride  3 mL Intravenous Q12H   Continuous:  ZOX:WRUEAV chloride, acetaminophen **OR** acetaminophen, albuterol, ondansetron **OR** ondansetron (ZOFRAN) IV, sodium chloride  Assessment/Plan:  Active Problems:   Diabetes mellitus without complication (HCC)   COPD (chronic obstructive pulmonary disease) (HCC)   Hypertension   Protein-calorie malnutrition, severe (HCC)   CHF (congestive heart failure) (HCC)   CKD (chronic kidney disease) stage 3, GFR 30-59 ml/min   Hypotension   Elevated troponin   Chest pain    Nonspecific chest pain Patient was admitted due to multiple risk activities. However, her symptoms were nonspecific. EKG did not show any new changes. T inversion was noted in leads V5 and V6. Troponins minimally elevated, which is most likely due to her renal failure. Symptoms have resolved this morning. It is also possible that this may have  had something to do with low blood pressures that was noted at the time of admission.  Hypotension Patient was recently started on hemodialysis. She is almost lost 8 pounds in the last few days. This along with the fact that she was on multiple blood pressure lowering agents could've contributed. Her antihypertensives have been held. Blood pressure has improved. She will be ambulated in the hallway.  Chronic systolic congestive heart failure Based on echocardiogram from September done at [facility] and available through care everywhere. She had an ejection fraction of 20-25% with severe global hypokinesis. Moderate to severely dilated left ventricle was noted. Apparently she was having issues with volume management with oral diuretics and hence was started on dialysis. Currently well compensated.  End-stage renal disease on hemodialysis Dialyzed on Tuesday, Thursday, Saturday. Anticipate she'll be able to go  home by tomorrow and then can resume her usual outpatient dialysis sessions. Lungs are clear.  Severe protein calorie malnutrition Nutritional supplements  History of diabetes mellitus type 2 Continue sliding scale insulin coverage.  DVT Prophylaxis: Subcutaneous heparin    Code Status: Full code  Family Communication: Discussed with the patient  Disposition Plan: Ambulate. Anticipate discharge tomorrow.      [facility]  [facility] Pager [phone] 04/23/2015, 12:10 PM  If 7PM-7AM, please contact night-coverage at [email or web address], password [facility]

## 2015-04-23 NOTE — Progress Notes (Addendum)
Patient admitted to floor from ED, she is alert and oriented x 3 (disoriented to time). She has dial-tek vac cath (double lumen) on right chest in place. Patient denies any pain. Patient placed on telemetry. Given room information and call bell system reviewed. Admission database completed.   Patient blood glucose taken, blood sugar was taken, 53 mg/dl. Patient given snack.   Repeat CBG 155 mg/dl. Will continue to monitor.

## 2015-04-24 DIAGNOSIS — I953 Hypotension of hemodialysis: Secondary | ICD-10-CM | POA: Diagnosis not present

## 2015-04-24 DIAGNOSIS — R0789 Other chest pain: Secondary | ICD-10-CM | POA: Diagnosis not present

## 2015-04-24 DIAGNOSIS — R079 Chest pain, unspecified: Secondary | ICD-10-CM | POA: Diagnosis not present

## 2015-04-24 LAB — BASIC METABOLIC PANEL
ANION GAP: 9 (ref 5–15)
BUN: 61 mg/dL — AB (ref 6–20)
CALCIUM: 8.3 mg/dL — AB (ref 8.9–10.3)
CO2: 25 mmol/L (ref 22–32)
CREATININE: 1.29 mg/dL — AB (ref 0.44–1.00)
Chloride: 100 mmol/L — ABNORMAL LOW (ref 101–111)
GFR calc Af Amer: 47 mL/min — ABNORMAL LOW (ref >60)
GFR, EST NON AFRICAN AMERICAN: 41 mL/min — AB (ref >60)
GLUCOSE: 159 mg/dL — AB (ref 65–99)
Potassium: 4.3 mmol/L (ref 3.5–5.1)
Sodium: 134 mmol/L — ABNORMAL LOW (ref 135–145)

## 2015-04-24 LAB — CBC
HCT: 41.5 % (ref 36.0–46.0)
Hemoglobin: 12.1 g/dL (ref 12.0–15.0)
MCH: 25.2 pg — AB (ref 26.0–34.0)
MCHC: 29.2 g/dL — AB (ref 30.0–36.0)
MCV: 86.5 fL (ref 78.0–100.0)
PLATELETS: 173 10*3/uL (ref 150–400)
RBC: 4.8 MIL/uL (ref 3.87–5.11)
RDW: 23.3 % — AB (ref 11.5–15.5)
WBC: 6.7 10*3/uL (ref 4.0–10.5)

## 2015-04-24 LAB — GLUCOSE, CAPILLARY
Glucose-Capillary: 126 mg/dL — ABNORMAL HIGH (ref 65–99)
Glucose-Capillary: 201 mg/dL — ABNORMAL HIGH (ref 65–99)

## 2015-04-24 MED ORDER — MIDODRINE HCL 10 MG PO TABS: ORAL_TABLET | ORAL | Status: AC

## 2015-04-24 NOTE — Discharge Summary (Signed)
[facility]  Physician Discharge Summary   Patient ID: [name] [name] MRN: [id number] DOB/AGE: Sep 21, 1945 69 y.o.  Admit date: 04/22/2015 Discharge date: 04/24/2015  PCP: Jackie Plum, MD  DISCHARGE DIAGNOSES:  Active Problems:   Diabetes mellitus without complication (HCC)   COPD (chronic obstructive pulmonary disease) (HCC)   Hypertension   Protein-calorie malnutrition, severe (HCC)   CHF (congestive heart failure) (HCC)   CKD (chronic kidney disease) stage 3, GFR 30-59 ml/min   Hypotension   Elevated troponin   Chest pain   RECOMMENDATIONS FOR OUTPATIENT FOLLOW UP: 1. Patient instructed to go for her usual dialysis schedule starting tomorrow. 2. She has been asked not to take any of her blood pressure lowering agents until she has seen her outpatient providers.   DISCHARGE CONDITION: fair  Diet recommendation: Heart healthy  Filed Weights   04/22/15 2138 04/23/15 0013 04/24/15 0454  Weight: 47.5 kg (104 lb 11.5 oz) 47.356 kg (104 lb 6.4 oz) 47.35 kg (104 lb 6.2 oz)    INITIAL HISTORY: 69 year old African-American female with a past medical history of hypertension, chronic systolic congestive heart failure, end-stage renal disease recently started on dialysis, type 2 diabetes, presented with chest pain, back pain and upper abdominal pain. She was noted to be hypotensive in the emergency department. She was hospitalized for further management.   HOSPITAL COURSE:   Nonspecific chest pain Patient was admitted due to multiple risk factors. However, her symptoms were nonspecific. EKG did not show any new changes. T inversion was noted in leads V5 and V6 in multiple EKGs. Troponins minimally elevated, which is most likely due to her renal failure. Symptoms resolved. It is also possible that this may have had something to do with low blood pressures that was noted at the time of admission. Patient was ambulated in the hallway without any difficulty. He is  followed by cardiology in [facility].  Hypotension Patient was recently started on hemodialysis. She is almost lost 8 pounds in the last few days. This along with the fact that she was on multiple blood pressure lowering agents could've contributed. Her antihypertensives have been held. Blood pressure has improved. She will be ambulated in the hallway. Midodrine on the days of dialysis. She may need less aggressive dialysis regimen.  Chronic systolic congestive heart failure Based on echocardiogram from September done at [facility] and available through care everywhere, she had an ejection fraction of 20-25% with severe global hypokinesis. Moderate to severely dilated left ventricle was noted. Apparently she was having issues with volume management with oral diuretics and hence was started on dialysis. Currently well compensated. On home oxygen.  End-stage renal disease on hemodialysis Dialyzed on Tuesday, Thursday, Saturday. Can resume outpatient dialysis tomorrow.  Severe protein calorie malnutrition Nutritional supplements  History of diabetes mellitus type 2 Continue home regimen  Patient is stable overall. Randel Pigg on going home. Okay for discharge.   PERTINENT LABS:  The results of significant diagnostics from this hospitalization (including imaging, microbiology, ancillary and laboratory) are listed below for reference.      Labs: Basic Metabolic Panel:  Recent Labs Lab 04/22/15 2040 04/23/15 0146 04/24/15 0333  NA 135 139 134*  K 3.7 3.8 4.3  CL 99* 100* 100*  CO2 25 24 25   GLUCOSE 93 161* 159*  BUN 34* 40* 61*  CREATININE 1.25* 1.25* 1.29*  CALCIUM 8.4* 8.4* 8.3*  MG  --  1.9  --   PHOS  --  5.1*  --    Liver Function Tests:  Recent Labs Lab 04/22/15 2040 04/23/15 0146  AST 37 39  ALT 27 27  ALKPHOS 94 82  BILITOT 0.8 0.7  PROT 7.0 6.6  ALBUMIN 3.9 3.5   CBC:  Recent Labs Lab 04/22/15 2040 04/23/15 0146 04/24/15 0333  WBC 5.1 4.8 6.7  HGB 13.5  12.9 12.1  HCT 45.7 43.8 41.5  MCV 85.9 85.7 86.5  PLT 181 169 173   Cardiac Enzymes:  Recent Labs Lab 04/22/15 2040 04/23/15 0146 04/23/15 0603 04/23/15 1420  TROPONINI 0.04* 0.04* 0.03 <0.03   BNP: BNP (last 3 results)  Recent Labs  07/30/14 0129  BNP >4500.0*     CBG:  Recent Labs Lab 04/23/15 0644 04/23/15 1624 04/23/15 2225 04/24/15 0537 04/24/15 1131  GLUCAP 137* 137* 131* 126* 201*     IMAGING STUDIES Dg Chest Portable 1 View  04/22/2015   CLINICAL DATA:  Shortness of breath, chest pain.  EXAM: PORTABLE CHEST 1 VIEW  COMPARISON:  04/22/2015  FINDINGS: Dual lumen right central venous catheter has unchanged, tip at the expected location of superior vena cava/ right atrium.  Cardiomediastinal silhouette is markedly enlarged. Mediastinal contours appear intact. Aortic knob calcifications are noted.  There is no evidence of focal airspace consolidation or pneumothorax. Left pleural effusion cannot be excluded, as the left costophrenic angle is obscured. There is mild prominence of the interstitial markings.  Osseous structures are without acute abnormality. Soft tissues are grossly normal.  IMPRESSION: Marked enlargement of the cardiac silhouette. This may represent cardiomegaly and/ or pericardial effusion.  Mild increase of the interstitial markings, likely due to mild pulmonary edema.  Left pleural effusion cannot be excluded, as the left costophrenic angle is obscured.   Electronically Signed   By: Ted Mcalpine M.D.   On: 04/22/2015 20:48    DISCHARGE EXAMINATION: Filed Vitals:   04/23/15 2141 04/24/15 0011 04/24/15 0454 04/24/15 0941  BP: 122/60  Pulse: 72 70 77 77  Temp: 97.5 F (36.4 C) 97.3 F (36.3 C) 97.9 F (36.6 C) 97.9 F (36.6 C)  TempSrc: Oral Oral Oral Oral  Resp: Height:      Weight:   47.35 kg (104 lb 6.2 oz)   SpO2: 100% 100% 95% 95%   General appearance: alert, cooperative, appears stated age and no  distress Resp: few crackles at the bases. No wheezing. Cardio: regular rate and rhythm, S1, S2 normal, no murmur, click, rub or gallop GI: soft, non-tender; bowel sounds normal; no masses,  no organomegaly Extremities: extremities normal, atraumatic, no cyanosis or edema   DISPOSITION: Home  Discharge Instructions    Call MD for:  difficulty breathing, headache or visual disturbances    Complete by:  As directed      Call MD for:  extreme fatigue    Complete by:  As directed      Call MD for:  persistant dizziness or light-headedness    Complete by:  As directed      Call MD for:  persistant nausea and vomiting    Complete by:  As directed      Call MD for:  severe uncontrolled pain    Complete by:  As directed      Call MD for:  temperature >100.4    Complete by:  As directed      Diet - low sodium heart healthy    Complete by:  As directed      Discharge instructions    Complete  by:  As directed   Please note that all your Blood pressure lowering medications have been stopped due to your low blood pressure. Please go for dialysis tomorrow as scheduled. Let them know about your low blood pressure. Please talk to your heart doctor and your kidney doctor regarding your medications.  You were cared for by a hospitalist during your hospital stay. If you have any questions about your discharge medications or the care you received while you were in the hospital after you are discharged, you can call the unit and asked to speak with the hospitalist on call if the hospitalist that took care of you is not available. Once you are discharged, your primary care physician will handle any further medical issues. Please note that NO REFILLS for any discharge medications will be authorized once you are discharged, as it is imperative that you return to your primary care physician (or establish a relationship with a primary care physician if you do not have one) for your aftercare needs so that they can  reassess your need for medications and monitor your lab values. If you do not have a primary care physician, you can call [phone] for a physician referral.     Increase activity slowly    Complete by:  As directed            ALLERGIES: No Known Allergies   Discharge Medication List as of 04/24/2015 12:24 PM    START taking these medications   Details  midodrine (PROAMATINE) 10 MG tablet Take one tablet on your dialysis days before you go for dialysis., Print      CONTINUE these medications which have NOT CHANGED   Details  albuterol (PROVENTIL HFA;VENTOLIN HFA) 108 (90 BASE) MCG/ACT inhaler Inhale 1 puff into the lungs every 6 (six) hours as needed for shortness of breath., Until Discontinued, Historical Med    aspirin 325 MG EC tablet Take 325 mg by mouth daily., Until Discontinued, Historical Med    famotidine (PEPCID) 20 MG tablet Take 20 mg by mouth 2 (two) times daily., Until Discontinued, Historical Med    Fluticasone-Salmeterol (ADVAIR) 250-50 MCG/DOSE AEPB Inhale 1 puff into the lungs 2 (two) times daily., Until Discontinued, Historical Med    gabapentin (NEURONTIN) 300 MG capsule Take 300 mg by mouth 2 (two) times daily., Until Discontinued, Historical Med    insulin regular (HUMULIN R) 100 units/mL injection Inject 0-24 Units into the skin 3 (three) times daily before meals., Until Discontinued, Historical Med    rosuvastatin (CRESTOR) 40 MG tablet Take 40 mg by mouth daily., Until Discontinued, Historical Med      STOP taking these medications     carvedilol (COREG) 6.25 MG tablet      torsemide (DEMADEX) 20 MG tablet        Follow-up Information    Follow up with OSEI-BONSU,GEORGE, MD On 05/01/2015.   Specialty:  Internal Medicine   Why:  @ 2:00 PM post hospitalization follow up. Confirmed appointment with Claiborne Billings information:   [address] [place] [address] [phone]       TOTAL DISCHARGE TIME: 35  minutes  [facility]  [facility] Pager [phone]  04/24/2015, 2:10 PM

## 2015-04-24 NOTE — Progress Notes (Signed)
SATURATION QUALIFICATIONS: (This note is used to comply with regulatory documentation for home oxygen)  Patient Saturations on Room Air at Rest = did not do%  Patient Saturations on Room Air while Ambulating = did not do%  Patient Saturations on 3 Liters of oxygen while Ambulating = 89%  Please briefly explain why patient needs home oxygen: Already on O2 at home. Ambulated 250 ft. With O2 3LPM- O2 Sat to 89%. Base line on 3LPM was 96%.

## 2015-04-24 NOTE — Discharge Instructions (Signed)
Hypotension  As your heart beats, it forces blood through your arteries. This force is your blood pressure. If your blood pressure is too low for you to go about your normal activities or to support the organs of your body, you have hypotension. Hypotension is also referred to as low blood pressure. When your blood pressure becomes too low, you may not get enough blood to your brain. As a result, you may feel weak, feel lightheaded, or develop a rapid heart rate. In a more severe case, you may faint.  CAUSES  Various conditions can cause hypotension. These include:  · Blood loss.  · Dehydration.  · Heart or endocrine problems.  · Pregnancy.  · Severe infection.  · Not having a well-balanced diet filled with needed nutrients.  · Severe allergic reactions (anaphylaxis).  Some medicines, such as blood pressure medicine or water pills (diuretics), may lower your blood pressure below normal. Sometimes taking too much medicine or taking medicine not as directed can cause hypotension.  TREATMENT   Hospitalization is sometimes required for hypotension if fluid or blood replacement is needed, if time is needed for medicines to wear off, or if further monitoring is needed. Treatment might include changing your diet, changing your medicines (including medicines aimed at raising your blood pressure), and use of support stockings.  HOME CARE INSTRUCTIONS   · Drink enough fluids to keep your urine clear or pale yellow.  · Take your medicines as directed by your health care provider.  · Get up slowly from reclining or sitting positions. This gives your blood pressure a chance to adjust.  · Wear support stockings as directed by your health care provider.  · Maintain a healthy diet by including nutritious food, such as fruits, vegetables, nuts, whole grains, and lean meats.  SEEK MEDICAL CARE IF:  · You have vomiting or diarrhea.  · You have a fever for more than 2-3 days.  · You feel more thirsty than usual.  · You feel weak and  tired.  SEEK IMMEDIATE MEDICAL CARE IF:   · You have chest pain or a fast or irregular heartbeat.  · You have a loss of feeling in some part of your body, or you lose movement in your arms or legs.  · You have trouble speaking.  · You become sweaty or feel lightheaded.  · You faint.  MAKE SURE YOU:   · Understand these instructions.  · Will watch your condition.  · Will get help right away if you are not doing well or get worse.     This information is not intended to replace advice given to you by your health care provider. Make sure you discuss any questions you have with your health care provider.     Document Released: 07/01/2005 Document Revised: 04/21/2013 Document Reviewed: 01/01/2013  Elsevier Interactive Patient Education ©2016 [employer]

## 2015-04-24 NOTE — Progress Notes (Signed)
IV removed per discharge order. Discharge paperwork and scripts given and explained to patient with teach back. Discharged via wheelchair to son's care with NT present.

## 2015-04-24 NOTE — Progress Notes (Signed)
Patient needs oxygen for transpiration home, pt/ son do not want to wait for oxygen to be delivered to the room and wants to leave without it. Patient has oxygen at home. [name] [name] [facility] [phone]

## 2015-04-25 DIAGNOSIS — D509 Iron deficiency anemia, unspecified: Secondary | ICD-10-CM | POA: Diagnosis not present

## 2015-04-25 DIAGNOSIS — N186 End stage renal disease: Secondary | ICD-10-CM | POA: Diagnosis not present

## 2015-04-25 DIAGNOSIS — T8249XA Other complication of vascular dialysis catheter, initial encounter: Secondary | ICD-10-CM | POA: Diagnosis not present

## 2015-04-26 DIAGNOSIS — Z794 Long term (current) use of insulin: Secondary | ICD-10-CM | POA: Diagnosis not present

## 2015-04-26 DIAGNOSIS — I130 Hypertensive heart and chronic kidney disease with heart failure and stage 1 through stage 4 chronic kidney disease, or unspecified chronic kidney disease: Secondary | ICD-10-CM | POA: Diagnosis not present

## 2015-04-26 DIAGNOSIS — Z7982 Long term (current) use of aspirin: Secondary | ICD-10-CM | POA: Diagnosis not present

## 2015-04-26 DIAGNOSIS — E785 Hyperlipidemia, unspecified: Secondary | ICD-10-CM | POA: Diagnosis not present

## 2015-04-26 DIAGNOSIS — E11649 Type 2 diabetes mellitus with hypoglycemia without coma: Secondary | ICD-10-CM | POA: Diagnosis not present

## 2015-04-26 DIAGNOSIS — Z720 Tobacco use: Secondary | ICD-10-CM | POA: Diagnosis not present

## 2015-04-26 DIAGNOSIS — R531 Weakness: Secondary | ICD-10-CM | POA: Diagnosis not present

## 2015-04-26 DIAGNOSIS — M549 Dorsalgia, unspecified: Secondary | ICD-10-CM | POA: Diagnosis not present

## 2015-04-26 DIAGNOSIS — N183 Chronic kidney disease, stage 3 (moderate): Secondary | ICD-10-CM | POA: Diagnosis not present

## 2015-04-26 DIAGNOSIS — Z9111 Patient's noncompliance with dietary regimen: Secondary | ICD-10-CM | POA: Diagnosis not present

## 2015-04-26 DIAGNOSIS — I509 Heart failure, unspecified: Secondary | ICD-10-CM | POA: Diagnosis not present

## 2015-04-26 DIAGNOSIS — Z9114 Patient's other noncompliance with medication regimen: Secondary | ICD-10-CM | POA: Diagnosis not present

## 2015-04-26 DIAGNOSIS — I129 Hypertensive chronic kidney disease with stage 1 through stage 4 chronic kidney disease, or unspecified chronic kidney disease: Secondary | ICD-10-CM | POA: Diagnosis not present

## 2015-04-26 DIAGNOSIS — J441 Chronic obstructive pulmonary disease with (acute) exacerbation: Secondary | ICD-10-CM | POA: Diagnosis not present

## 2015-04-26 DIAGNOSIS — Z79899 Other long term (current) drug therapy: Secondary | ICD-10-CM | POA: Diagnosis not present

## 2015-04-26 DIAGNOSIS — Z9981 Dependence on supplemental oxygen: Secondary | ICD-10-CM | POA: Diagnosis not present

## 2015-04-26 DIAGNOSIS — Z87891 Personal history of nicotine dependence: Secondary | ICD-10-CM | POA: Diagnosis not present

## 2015-04-26 DIAGNOSIS — I480 Paroxysmal atrial fibrillation: Secondary | ICD-10-CM | POA: Diagnosis not present

## 2015-04-26 DIAGNOSIS — J449 Chronic obstructive pulmonary disease, unspecified: Secondary | ICD-10-CM | POA: Diagnosis not present

## 2015-04-26 DIAGNOSIS — E1122 Type 2 diabetes mellitus with diabetic chronic kidney disease: Secondary | ICD-10-CM | POA: Diagnosis not present

## 2015-04-26 DIAGNOSIS — I5023 Acute on chronic systolic (congestive) heart failure: Secondary | ICD-10-CM | POA: Diagnosis not present

## 2015-04-26 DIAGNOSIS — J45909 Unspecified asthma, uncomplicated: Secondary | ICD-10-CM | POA: Diagnosis not present

## 2015-04-27 DIAGNOSIS — T8249XA Other complication of vascular dialysis catheter, initial encounter: Secondary | ICD-10-CM | POA: Diagnosis not present

## 2015-04-27 DIAGNOSIS — D509 Iron deficiency anemia, unspecified: Secondary | ICD-10-CM | POA: Diagnosis not present

## 2015-04-27 DIAGNOSIS — N186 End stage renal disease: Secondary | ICD-10-CM | POA: Diagnosis not present

## 2015-04-27 NOTE — Patient Outreach (Signed)
[facility]) Care Management  04/27/2015  Chelsea Junkin 06/17/1946 [id number]   Referral from [facility] Risk Analysis and recent admission, assigned Elliot Cousin, RN to outreach for [facility] Care Management services.  Thanks, Corrie Mckusick. Sharlee Blew [facility] Care Management [facility] CM Assistant Phone: [phone] Fax: [phone]

## 2015-04-28 DIAGNOSIS — N183 Chronic kidney disease, stage 3 (moderate): Secondary | ICD-10-CM | POA: Diagnosis not present

## 2015-04-28 DIAGNOSIS — M549 Dorsalgia, unspecified: Secondary | ICD-10-CM | POA: Diagnosis not present

## 2015-04-28 DIAGNOSIS — I5023 Acute on chronic systolic (congestive) heart failure: Secondary | ICD-10-CM | POA: Diagnosis not present

## 2015-04-28 DIAGNOSIS — J441 Chronic obstructive pulmonary disease with (acute) exacerbation: Secondary | ICD-10-CM | POA: Diagnosis not present

## 2015-04-28 DIAGNOSIS — Z720 Tobacco use: Secondary | ICD-10-CM | POA: Diagnosis not present

## 2015-04-28 DIAGNOSIS — I480 Paroxysmal atrial fibrillation: Secondary | ICD-10-CM | POA: Diagnosis not present

## 2015-04-28 DIAGNOSIS — E1122 Type 2 diabetes mellitus with diabetic chronic kidney disease: Secondary | ICD-10-CM | POA: Diagnosis not present

## 2015-04-28 DIAGNOSIS — Z9114 Patient's other noncompliance with medication regimen: Secondary | ICD-10-CM | POA: Diagnosis not present

## 2015-04-28 DIAGNOSIS — Z794 Long term (current) use of insulin: Secondary | ICD-10-CM | POA: Diagnosis not present

## 2015-04-28 DIAGNOSIS — Z9111 Patient's noncompliance with dietary regimen: Secondary | ICD-10-CM | POA: Diagnosis not present

## 2015-04-28 DIAGNOSIS — I129 Hypertensive chronic kidney disease with stage 1 through stage 4 chronic kidney disease, or unspecified chronic kidney disease: Secondary | ICD-10-CM | POA: Diagnosis not present

## 2015-04-28 DIAGNOSIS — Z9981 Dependence on supplemental oxygen: Secondary | ICD-10-CM | POA: Diagnosis not present

## 2015-04-29 DIAGNOSIS — N186 End stage renal disease: Secondary | ICD-10-CM | POA: Diagnosis not present

## 2015-04-29 DIAGNOSIS — T8249XA Other complication of vascular dialysis catheter, initial encounter: Secondary | ICD-10-CM | POA: Diagnosis not present

## 2015-04-29 DIAGNOSIS — D509 Iron deficiency anemia, unspecified: Secondary | ICD-10-CM | POA: Diagnosis not present

## 2015-04-30 DIAGNOSIS — E1122 Type 2 diabetes mellitus with diabetic chronic kidney disease: Secondary | ICD-10-CM | POA: Diagnosis not present

## 2015-04-30 DIAGNOSIS — M549 Dorsalgia, unspecified: Secondary | ICD-10-CM | POA: Diagnosis not present

## 2015-04-30 DIAGNOSIS — I480 Paroxysmal atrial fibrillation: Secondary | ICD-10-CM | POA: Diagnosis not present

## 2015-04-30 DIAGNOSIS — Z794 Long term (current) use of insulin: Secondary | ICD-10-CM | POA: Diagnosis not present

## 2015-04-30 DIAGNOSIS — Z9111 Patient's noncompliance with dietary regimen: Secondary | ICD-10-CM | POA: Diagnosis not present

## 2015-04-30 DIAGNOSIS — Z720 Tobacco use: Secondary | ICD-10-CM | POA: Diagnosis not present

## 2015-04-30 DIAGNOSIS — I129 Hypertensive chronic kidney disease with stage 1 through stage 4 chronic kidney disease, or unspecified chronic kidney disease: Secondary | ICD-10-CM | POA: Diagnosis not present

## 2015-04-30 DIAGNOSIS — Z9114 Patient's other noncompliance with medication regimen: Secondary | ICD-10-CM | POA: Diagnosis not present

## 2015-04-30 DIAGNOSIS — N183 Chronic kidney disease, stage 3 (moderate): Secondary | ICD-10-CM | POA: Diagnosis not present

## 2015-04-30 DIAGNOSIS — J441 Chronic obstructive pulmonary disease with (acute) exacerbation: Secondary | ICD-10-CM | POA: Diagnosis not present

## 2015-04-30 DIAGNOSIS — Z9981 Dependence on supplemental oxygen: Secondary | ICD-10-CM | POA: Diagnosis not present

## 2015-04-30 DIAGNOSIS — I5023 Acute on chronic systolic (congestive) heart failure: Secondary | ICD-10-CM | POA: Diagnosis not present

## 2015-05-01 ENCOUNTER — Other Ambulatory Visit: Payer: Self-pay | Admitting: *Deleted

## 2015-05-01 DIAGNOSIS — M549 Dorsalgia, unspecified: Secondary | ICD-10-CM | POA: Diagnosis not present

## 2015-05-01 DIAGNOSIS — I129 Hypertensive chronic kidney disease with stage 1 through stage 4 chronic kidney disease, or unspecified chronic kidney disease: Secondary | ICD-10-CM | POA: Diagnosis not present

## 2015-05-01 DIAGNOSIS — Z9111 Patient's noncompliance with dietary regimen: Secondary | ICD-10-CM | POA: Diagnosis not present

## 2015-05-01 DIAGNOSIS — J441 Chronic obstructive pulmonary disease with (acute) exacerbation: Secondary | ICD-10-CM | POA: Diagnosis not present

## 2015-05-01 DIAGNOSIS — Z9114 Patient's other noncompliance with medication regimen: Secondary | ICD-10-CM | POA: Diagnosis not present

## 2015-05-01 DIAGNOSIS — N183 Chronic kidney disease, stage 3 (moderate): Secondary | ICD-10-CM | POA: Diagnosis not present

## 2015-05-01 DIAGNOSIS — I480 Paroxysmal atrial fibrillation: Secondary | ICD-10-CM | POA: Diagnosis not present

## 2015-05-01 DIAGNOSIS — Z9981 Dependence on supplemental oxygen: Secondary | ICD-10-CM | POA: Diagnosis not present

## 2015-05-01 DIAGNOSIS — Z794 Long term (current) use of insulin: Secondary | ICD-10-CM | POA: Diagnosis not present

## 2015-05-01 DIAGNOSIS — I5023 Acute on chronic systolic (congestive) heart failure: Secondary | ICD-10-CM | POA: Diagnosis not present

## 2015-05-01 DIAGNOSIS — E1122 Type 2 diabetes mellitus with diabetic chronic kidney disease: Secondary | ICD-10-CM | POA: Diagnosis not present

## 2015-05-01 DIAGNOSIS — Z720 Tobacco use: Secondary | ICD-10-CM | POA: Diagnosis not present

## 2015-05-01 NOTE — Patient Outreach (Signed)
[facility]) Care Management  05/01/2015  Aloura Stepper Sep 12, 1945 [id number]   RN attempted to reach this pt however unsuccessful. Will continue outreach attempted for [facility] services.  Elliot Cousin, RN Care Management Coordinator [facility] Main Office [phone]

## 2015-05-01 NOTE — Patient Outreach (Signed)
[facility]) Care Management  04/28/2015  [name] [name] 17-Mar-1946 [id number]  Discharged on 04/24/2015 based upon the referral.   Initial outreach call however unsuccessful. Will continue to contact this pt for [facility] services.  Elliot Cousin, RN Care Management Coordinator [facility] Main Office [phone]

## 2015-05-02 ENCOUNTER — Encounter: Payer: Self-pay | Admitting: *Deleted

## 2015-05-02 ENCOUNTER — Other Ambulatory Visit: Payer: Self-pay | Admitting: *Deleted

## 2015-05-02 DIAGNOSIS — D509 Iron deficiency anemia, unspecified: Secondary | ICD-10-CM | POA: Diagnosis not present

## 2015-05-02 DIAGNOSIS — N186 End stage renal disease: Secondary | ICD-10-CM | POA: Diagnosis not present

## 2015-05-02 DIAGNOSIS — T8249XA Other complication of vascular dialysis catheter, initial encounter: Secondary | ICD-10-CM | POA: Diagnosis not present

## 2015-05-02 NOTE — Patient Outreach (Signed)
[facility]) Care Management  05/02/2015  Trishia Carawan [birth year] [id number]   3rd Outreach contact RN attempted to reach pt at several contact numbers however pt not available and RN unable to leave a voice message to request a call back. Therefore RN will send outreach letter to allow pt time to possibly response for pending [facility] services.  Elliot Cousin, RN Care Management Coordinator [facility] Main Office [phone]

## 2015-05-03 DIAGNOSIS — Z7982 Long term (current) use of aspirin: Secondary | ICD-10-CM | POA: Diagnosis not present

## 2015-05-03 DIAGNOSIS — I959 Hypotension, unspecified: Secondary | ICD-10-CM | POA: Diagnosis not present

## 2015-05-03 DIAGNOSIS — M5136 Other intervertebral disc degeneration, lumbar region: Secondary | ICD-10-CM | POA: Diagnosis not present

## 2015-05-03 DIAGNOSIS — M549 Dorsalgia, unspecified: Secondary | ICD-10-CM | POA: Diagnosis not present

## 2015-05-03 DIAGNOSIS — I5022 Chronic systolic (congestive) heart failure: Secondary | ICD-10-CM | POA: Diagnosis not present

## 2015-05-03 DIAGNOSIS — I10 Essential (primary) hypertension: Secondary | ICD-10-CM | POA: Diagnosis not present

## 2015-05-03 DIAGNOSIS — G9341 Metabolic encephalopathy: Secondary | ICD-10-CM | POA: Diagnosis not present

## 2015-05-03 DIAGNOSIS — D631 Anemia in chronic kidney disease: Secondary | ICD-10-CM | POA: Diagnosis not present

## 2015-05-03 DIAGNOSIS — N184 Chronic kidney disease, stage 4 (severe): Secondary | ICD-10-CM | POA: Diagnosis not present

## 2015-05-03 DIAGNOSIS — Z794 Long term (current) use of insulin: Secondary | ICD-10-CM | POA: Diagnosis not present

## 2015-05-03 DIAGNOSIS — Z720 Tobacco use: Secondary | ICD-10-CM | POA: Diagnosis not present

## 2015-05-03 DIAGNOSIS — I132 Hypertensive heart and chronic kidney disease with heart failure and with stage 5 chronic kidney disease, or end stage renal disease: Secondary | ICD-10-CM | POA: Diagnosis not present

## 2015-05-03 DIAGNOSIS — E11649 Type 2 diabetes mellitus with hypoglycemia without coma: Secondary | ICD-10-CM | POA: Diagnosis not present

## 2015-05-03 DIAGNOSIS — E860 Dehydration: Secondary | ICD-10-CM | POA: Diagnosis not present

## 2015-05-03 DIAGNOSIS — J441 Chronic obstructive pulmonary disease with (acute) exacerbation: Secondary | ICD-10-CM | POA: Diagnosis not present

## 2015-05-03 DIAGNOSIS — R402411 Glasgow coma scale score 13-15, in the field [EMT or ambulance]: Secondary | ICD-10-CM | POA: Diagnosis not present

## 2015-05-03 DIAGNOSIS — Z9981 Dependence on supplemental oxygen: Secondary | ICD-10-CM | POA: Diagnosis not present

## 2015-05-03 DIAGNOSIS — E1122 Type 2 diabetes mellitus with diabetic chronic kidney disease: Secondary | ICD-10-CM | POA: Diagnosis not present

## 2015-05-03 DIAGNOSIS — I5023 Acute on chronic systolic (congestive) heart failure: Secondary | ICD-10-CM | POA: Diagnosis not present

## 2015-05-03 DIAGNOSIS — Z9114 Patient's other noncompliance with medication regimen: Secondary | ICD-10-CM | POA: Diagnosis not present

## 2015-05-03 DIAGNOSIS — Z992 Dependence on renal dialysis: Secondary | ICD-10-CM | POA: Diagnosis not present

## 2015-05-03 DIAGNOSIS — I480 Paroxysmal atrial fibrillation: Secondary | ICD-10-CM | POA: Diagnosis not present

## 2015-05-03 DIAGNOSIS — I120 Hypertensive chronic kidney disease with stage 5 chronic kidney disease or end stage renal disease: Secondary | ICD-10-CM | POA: Diagnosis not present

## 2015-05-03 DIAGNOSIS — J45909 Unspecified asthma, uncomplicated: Secondary | ICD-10-CM | POA: Diagnosis not present

## 2015-05-03 DIAGNOSIS — Z8249 Family history of ischemic heart disease and other diseases of the circulatory system: Secondary | ICD-10-CM | POA: Diagnosis not present

## 2015-05-03 DIAGNOSIS — Z87891 Personal history of nicotine dependence: Secondary | ICD-10-CM | POA: Diagnosis not present

## 2015-05-03 DIAGNOSIS — N186 End stage renal disease: Secondary | ICD-10-CM | POA: Diagnosis not present

## 2015-05-03 DIAGNOSIS — N183 Chronic kidney disease, stage 3 (moderate): Secondary | ICD-10-CM | POA: Diagnosis not present

## 2015-05-03 DIAGNOSIS — J439 Emphysema, unspecified: Secondary | ICD-10-CM | POA: Diagnosis not present

## 2015-05-03 DIAGNOSIS — R4182 Altered mental status, unspecified: Secondary | ICD-10-CM | POA: Diagnosis not present

## 2015-05-03 DIAGNOSIS — Z9111 Patient's noncompliance with dietary regimen: Secondary | ICD-10-CM | POA: Diagnosis not present

## 2015-05-03 DIAGNOSIS — I129 Hypertensive chronic kidney disease with stage 1 through stage 4 chronic kidney disease, or unspecified chronic kidney disease: Secondary | ICD-10-CM | POA: Diagnosis not present

## 2015-05-03 DIAGNOSIS — E785 Hyperlipidemia, unspecified: Secondary | ICD-10-CM | POA: Diagnosis not present

## 2015-05-03 DIAGNOSIS — I420 Dilated cardiomyopathy: Secondary | ICD-10-CM | POA: Diagnosis not present

## 2015-05-03 DIAGNOSIS — T383X5A Adverse effect of insulin and oral hypoglycemic [antidiabetic] drugs, initial encounter: Secondary | ICD-10-CM | POA: Diagnosis not present

## 2015-05-04 DIAGNOSIS — Z794 Long term (current) use of insulin: Secondary | ICD-10-CM | POA: Diagnosis not present

## 2015-05-04 DIAGNOSIS — E785 Hyperlipidemia, unspecified: Secondary | ICD-10-CM | POA: Diagnosis not present

## 2015-05-04 DIAGNOSIS — Z9111 Patient's noncompliance with dietary regimen: Secondary | ICD-10-CM | POA: Diagnosis not present

## 2015-05-04 DIAGNOSIS — J439 Emphysema, unspecified: Secondary | ICD-10-CM | POA: Diagnosis not present

## 2015-05-04 DIAGNOSIS — G9341 Metabolic encephalopathy: Secondary | ICD-10-CM | POA: Diagnosis not present

## 2015-05-04 DIAGNOSIS — I5022 Chronic systolic (congestive) heart failure: Secondary | ICD-10-CM | POA: Diagnosis not present

## 2015-05-04 DIAGNOSIS — T383X5A Adverse effect of insulin and oral hypoglycemic [antidiabetic] drugs, initial encounter: Secondary | ICD-10-CM | POA: Diagnosis not present

## 2015-05-04 DIAGNOSIS — Z8249 Family history of ischemic heart disease and other diseases of the circulatory system: Secondary | ICD-10-CM | POA: Diagnosis not present

## 2015-05-04 DIAGNOSIS — M5136 Other intervertebral disc degeneration, lumbar region: Secondary | ICD-10-CM | POA: Diagnosis not present

## 2015-05-04 DIAGNOSIS — E11649 Type 2 diabetes mellitus with hypoglycemia without coma: Secondary | ICD-10-CM | POA: Diagnosis not present

## 2015-05-04 DIAGNOSIS — I959 Hypotension, unspecified: Secondary | ICD-10-CM | POA: Diagnosis not present

## 2015-05-04 DIAGNOSIS — E1122 Type 2 diabetes mellitus with diabetic chronic kidney disease: Secondary | ICD-10-CM | POA: Diagnosis not present

## 2015-05-04 DIAGNOSIS — I132 Hypertensive heart and chronic kidney disease with heart failure and with stage 5 chronic kidney disease, or end stage renal disease: Secondary | ICD-10-CM | POA: Diagnosis not present

## 2015-05-04 DIAGNOSIS — I420 Dilated cardiomyopathy: Secondary | ICD-10-CM | POA: Diagnosis not present

## 2015-05-04 DIAGNOSIS — N186 End stage renal disease: Secondary | ICD-10-CM | POA: Diagnosis not present

## 2015-05-04 DIAGNOSIS — Z992 Dependence on renal dialysis: Secondary | ICD-10-CM | POA: Diagnosis not present

## 2015-05-04 DIAGNOSIS — E860 Dehydration: Secondary | ICD-10-CM | POA: Diagnosis not present

## 2015-05-04 DIAGNOSIS — Z87891 Personal history of nicotine dependence: Secondary | ICD-10-CM | POA: Diagnosis not present

## 2015-05-04 DIAGNOSIS — J45909 Unspecified asthma, uncomplicated: Secondary | ICD-10-CM | POA: Diagnosis not present

## 2015-05-04 DIAGNOSIS — Z7982 Long term (current) use of aspirin: Secondary | ICD-10-CM | POA: Diagnosis not present

## 2015-05-04 DIAGNOSIS — Z9114 Patient's other noncompliance with medication regimen: Secondary | ICD-10-CM | POA: Diagnosis not present

## 2015-05-05 DIAGNOSIS — Z720 Tobacco use: Secondary | ICD-10-CM | POA: Diagnosis not present

## 2015-05-05 DIAGNOSIS — N183 Chronic kidney disease, stage 3 (moderate): Secondary | ICD-10-CM | POA: Diagnosis not present

## 2015-05-05 DIAGNOSIS — Z794 Long term (current) use of insulin: Secondary | ICD-10-CM | POA: Diagnosis not present

## 2015-05-05 DIAGNOSIS — I129 Hypertensive chronic kidney disease with stage 1 through stage 4 chronic kidney disease, or unspecified chronic kidney disease: Secondary | ICD-10-CM | POA: Diagnosis not present

## 2015-05-05 DIAGNOSIS — I480 Paroxysmal atrial fibrillation: Secondary | ICD-10-CM | POA: Diagnosis not present

## 2015-05-05 DIAGNOSIS — Z9114 Patient's other noncompliance with medication regimen: Secondary | ICD-10-CM | POA: Diagnosis not present

## 2015-05-05 DIAGNOSIS — E1122 Type 2 diabetes mellitus with diabetic chronic kidney disease: Secondary | ICD-10-CM | POA: Diagnosis not present

## 2015-05-05 DIAGNOSIS — J441 Chronic obstructive pulmonary disease with (acute) exacerbation: Secondary | ICD-10-CM | POA: Diagnosis not present

## 2015-05-05 DIAGNOSIS — Z9981 Dependence on supplemental oxygen: Secondary | ICD-10-CM | POA: Diagnosis not present

## 2015-05-05 DIAGNOSIS — M549 Dorsalgia, unspecified: Secondary | ICD-10-CM | POA: Diagnosis not present

## 2015-05-05 DIAGNOSIS — Z9111 Patient's noncompliance with dietary regimen: Secondary | ICD-10-CM | POA: Diagnosis not present

## 2015-05-05 DIAGNOSIS — I5023 Acute on chronic systolic (congestive) heart failure: Secondary | ICD-10-CM | POA: Diagnosis not present

## 2015-05-06 DIAGNOSIS — D509 Iron deficiency anemia, unspecified: Secondary | ICD-10-CM | POA: Diagnosis not present

## 2015-05-06 DIAGNOSIS — N186 End stage renal disease: Secondary | ICD-10-CM | POA: Diagnosis not present

## 2015-05-06 DIAGNOSIS — T8249XA Other complication of vascular dialysis catheter, initial encounter: Secondary | ICD-10-CM | POA: Diagnosis not present

## 2015-05-08 DIAGNOSIS — J441 Chronic obstructive pulmonary disease with (acute) exacerbation: Secondary | ICD-10-CM | POA: Diagnosis not present

## 2015-05-08 DIAGNOSIS — M549 Dorsalgia, unspecified: Secondary | ICD-10-CM | POA: Diagnosis not present

## 2015-05-08 DIAGNOSIS — I480 Paroxysmal atrial fibrillation: Secondary | ICD-10-CM | POA: Diagnosis not present

## 2015-05-08 DIAGNOSIS — Z9981 Dependence on supplemental oxygen: Secondary | ICD-10-CM | POA: Diagnosis not present

## 2015-05-08 DIAGNOSIS — E119 Type 2 diabetes mellitus without complications: Secondary | ICD-10-CM | POA: Diagnosis not present

## 2015-05-08 DIAGNOSIS — Z794 Long term (current) use of insulin: Secondary | ICD-10-CM | POA: Diagnosis not present

## 2015-05-08 DIAGNOSIS — I70209 Unspecified atherosclerosis of native arteries of extremities, unspecified extremity: Secondary | ICD-10-CM | POA: Diagnosis not present

## 2015-05-08 DIAGNOSIS — E1122 Type 2 diabetes mellitus with diabetic chronic kidney disease: Secondary | ICD-10-CM | POA: Diagnosis not present

## 2015-05-08 DIAGNOSIS — Z9114 Patient's other noncompliance with medication regimen: Secondary | ICD-10-CM | POA: Diagnosis not present

## 2015-05-08 DIAGNOSIS — I129 Hypertensive chronic kidney disease with stage 1 through stage 4 chronic kidney disease, or unspecified chronic kidney disease: Secondary | ICD-10-CM | POA: Diagnosis not present

## 2015-05-08 DIAGNOSIS — I5023 Acute on chronic systolic (congestive) heart failure: Secondary | ICD-10-CM | POA: Diagnosis not present

## 2015-05-08 DIAGNOSIS — I10 Essential (primary) hypertension: Secondary | ICD-10-CM | POA: Diagnosis not present

## 2015-05-08 DIAGNOSIS — N183 Chronic kidney disease, stage 3 (moderate): Secondary | ICD-10-CM | POA: Diagnosis not present

## 2015-05-08 DIAGNOSIS — E785 Hyperlipidemia, unspecified: Secondary | ICD-10-CM | POA: Diagnosis not present

## 2015-05-08 DIAGNOSIS — Z9111 Patient's noncompliance with dietary regimen: Secondary | ICD-10-CM | POA: Diagnosis not present

## 2015-05-08 DIAGNOSIS — Z720 Tobacco use: Secondary | ICD-10-CM | POA: Diagnosis not present

## 2015-05-09 DIAGNOSIS — T8249XA Other complication of vascular dialysis catheter, initial encounter: Secondary | ICD-10-CM | POA: Diagnosis not present

## 2015-05-09 DIAGNOSIS — D509 Iron deficiency anemia, unspecified: Secondary | ICD-10-CM | POA: Diagnosis not present

## 2015-05-09 DIAGNOSIS — N186 End stage renal disease: Secondary | ICD-10-CM | POA: Diagnosis not present

## 2015-05-10 DIAGNOSIS — J449 Chronic obstructive pulmonary disease, unspecified: Secondary | ICD-10-CM | POA: Diagnosis not present

## 2015-05-11 DIAGNOSIS — N186 End stage renal disease: Secondary | ICD-10-CM | POA: Diagnosis not present

## 2015-05-11 DIAGNOSIS — D509 Iron deficiency anemia, unspecified: Secondary | ICD-10-CM | POA: Diagnosis not present

## 2015-05-11 DIAGNOSIS — T8249XA Other complication of vascular dialysis catheter, initial encounter: Secondary | ICD-10-CM | POA: Diagnosis not present

## 2015-05-12 DIAGNOSIS — M549 Dorsalgia, unspecified: Secondary | ICD-10-CM | POA: Diagnosis not present

## 2015-05-12 DIAGNOSIS — J441 Chronic obstructive pulmonary disease with (acute) exacerbation: Secondary | ICD-10-CM | POA: Diagnosis not present

## 2015-05-12 DIAGNOSIS — E1122 Type 2 diabetes mellitus with diabetic chronic kidney disease: Secondary | ICD-10-CM | POA: Diagnosis not present

## 2015-05-12 DIAGNOSIS — I129 Hypertensive chronic kidney disease with stage 1 through stage 4 chronic kidney disease, or unspecified chronic kidney disease: Secondary | ICD-10-CM | POA: Diagnosis not present

## 2015-05-12 DIAGNOSIS — N183 Chronic kidney disease, stage 3 (moderate): Secondary | ICD-10-CM | POA: Diagnosis not present

## 2015-05-12 DIAGNOSIS — Z794 Long term (current) use of insulin: Secondary | ICD-10-CM | POA: Diagnosis not present

## 2015-05-12 DIAGNOSIS — Z720 Tobacco use: Secondary | ICD-10-CM | POA: Diagnosis not present

## 2015-05-12 DIAGNOSIS — Z9981 Dependence on supplemental oxygen: Secondary | ICD-10-CM | POA: Diagnosis not present

## 2015-05-12 DIAGNOSIS — Z9114 Patient's other noncompliance with medication regimen: Secondary | ICD-10-CM | POA: Diagnosis not present

## 2015-05-12 DIAGNOSIS — I5023 Acute on chronic systolic (congestive) heart failure: Secondary | ICD-10-CM | POA: Diagnosis not present

## 2015-05-12 DIAGNOSIS — Z9111 Patient's noncompliance with dietary regimen: Secondary | ICD-10-CM | POA: Diagnosis not present

## 2015-05-12 DIAGNOSIS — I480 Paroxysmal atrial fibrillation: Secondary | ICD-10-CM | POA: Diagnosis not present

## 2015-05-13 DIAGNOSIS — D509 Iron deficiency anemia, unspecified: Secondary | ICD-10-CM | POA: Diagnosis not present

## 2015-05-13 DIAGNOSIS — T8249XA Other complication of vascular dialysis catheter, initial encounter: Secondary | ICD-10-CM | POA: Diagnosis not present

## 2015-05-13 DIAGNOSIS — N186 End stage renal disease: Secondary | ICD-10-CM | POA: Diagnosis not present

## 2015-05-13 DIAGNOSIS — J449 Chronic obstructive pulmonary disease, unspecified: Secondary | ICD-10-CM | POA: Diagnosis not present

## 2015-05-15 DIAGNOSIS — Z794 Long term (current) use of insulin: Secondary | ICD-10-CM | POA: Diagnosis not present

## 2015-05-15 DIAGNOSIS — I5023 Acute on chronic systolic (congestive) heart failure: Secondary | ICD-10-CM | POA: Diagnosis not present

## 2015-05-15 DIAGNOSIS — I480 Paroxysmal atrial fibrillation: Secondary | ICD-10-CM | POA: Diagnosis not present

## 2015-05-15 DIAGNOSIS — I129 Hypertensive chronic kidney disease with stage 1 through stage 4 chronic kidney disease, or unspecified chronic kidney disease: Secondary | ICD-10-CM | POA: Diagnosis not present

## 2015-05-15 DIAGNOSIS — J441 Chronic obstructive pulmonary disease with (acute) exacerbation: Secondary | ICD-10-CM | POA: Diagnosis not present

## 2015-05-15 DIAGNOSIS — N183 Chronic kidney disease, stage 3 (moderate): Secondary | ICD-10-CM | POA: Diagnosis not present

## 2015-05-15 DIAGNOSIS — Z9114 Patient's other noncompliance with medication regimen: Secondary | ICD-10-CM | POA: Diagnosis not present

## 2015-05-15 DIAGNOSIS — Z720 Tobacco use: Secondary | ICD-10-CM | POA: Diagnosis not present

## 2015-05-15 DIAGNOSIS — M549 Dorsalgia, unspecified: Secondary | ICD-10-CM | POA: Diagnosis not present

## 2015-05-15 DIAGNOSIS — E1122 Type 2 diabetes mellitus with diabetic chronic kidney disease: Secondary | ICD-10-CM | POA: Diagnosis not present

## 2015-05-15 DIAGNOSIS — Z9111 Patient's noncompliance with dietary regimen: Secondary | ICD-10-CM | POA: Diagnosis not present

## 2015-05-15 DIAGNOSIS — Z9981 Dependence on supplemental oxygen: Secondary | ICD-10-CM | POA: Diagnosis not present

## 2015-05-16 DIAGNOSIS — N2581 Secondary hyperparathyroidism of renal origin: Secondary | ICD-10-CM | POA: Diagnosis not present

## 2015-05-16 DIAGNOSIS — N186 End stage renal disease: Secondary | ICD-10-CM | POA: Diagnosis not present

## 2015-05-16 DIAGNOSIS — I10 Essential (primary) hypertension: Secondary | ICD-10-CM | POA: Diagnosis not present

## 2015-05-16 DIAGNOSIS — D509 Iron deficiency anemia, unspecified: Secondary | ICD-10-CM | POA: Diagnosis not present

## 2015-05-16 DIAGNOSIS — D631 Anemia in chronic kidney disease: Secondary | ICD-10-CM | POA: Diagnosis not present

## 2015-05-17 DIAGNOSIS — Z720 Tobacco use: Secondary | ICD-10-CM | POA: Diagnosis not present

## 2015-05-17 DIAGNOSIS — Z794 Long term (current) use of insulin: Secondary | ICD-10-CM | POA: Diagnosis not present

## 2015-05-17 DIAGNOSIS — M549 Dorsalgia, unspecified: Secondary | ICD-10-CM | POA: Diagnosis not present

## 2015-05-17 DIAGNOSIS — I480 Paroxysmal atrial fibrillation: Secondary | ICD-10-CM | POA: Diagnosis not present

## 2015-05-17 DIAGNOSIS — N183 Chronic kidney disease, stage 3 (moderate): Secondary | ICD-10-CM | POA: Diagnosis not present

## 2015-05-17 DIAGNOSIS — E1122 Type 2 diabetes mellitus with diabetic chronic kidney disease: Secondary | ICD-10-CM | POA: Diagnosis not present

## 2015-05-17 DIAGNOSIS — Z9981 Dependence on supplemental oxygen: Secondary | ICD-10-CM | POA: Diagnosis not present

## 2015-05-17 DIAGNOSIS — Z9114 Patient's other noncompliance with medication regimen: Secondary | ICD-10-CM | POA: Diagnosis not present

## 2015-05-17 DIAGNOSIS — I129 Hypertensive chronic kidney disease with stage 1 through stage 4 chronic kidney disease, or unspecified chronic kidney disease: Secondary | ICD-10-CM | POA: Diagnosis not present

## 2015-05-17 DIAGNOSIS — Z9111 Patient's noncompliance with dietary regimen: Secondary | ICD-10-CM | POA: Diagnosis not present

## 2015-05-17 DIAGNOSIS — I5023 Acute on chronic systolic (congestive) heart failure: Secondary | ICD-10-CM | POA: Diagnosis not present

## 2015-05-17 DIAGNOSIS — J441 Chronic obstructive pulmonary disease with (acute) exacerbation: Secondary | ICD-10-CM | POA: Diagnosis not present

## 2015-05-18 DIAGNOSIS — Z9114 Patient's other noncompliance with medication regimen: Secondary | ICD-10-CM | POA: Diagnosis not present

## 2015-05-18 DIAGNOSIS — N186 End stage renal disease: Secondary | ICD-10-CM | POA: Diagnosis not present

## 2015-05-18 DIAGNOSIS — N2581 Secondary hyperparathyroidism of renal origin: Secondary | ICD-10-CM | POA: Diagnosis not present

## 2015-05-18 DIAGNOSIS — Z794 Long term (current) use of insulin: Secondary | ICD-10-CM | POA: Diagnosis not present

## 2015-05-18 DIAGNOSIS — E118 Type 2 diabetes mellitus with unspecified complications: Secondary | ICD-10-CM | POA: Diagnosis not present

## 2015-05-18 DIAGNOSIS — Z720 Tobacco use: Secondary | ICD-10-CM | POA: Diagnosis not present

## 2015-05-18 DIAGNOSIS — J441 Chronic obstructive pulmonary disease with (acute) exacerbation: Secondary | ICD-10-CM | POA: Diagnosis not present

## 2015-05-18 DIAGNOSIS — N183 Chronic kidney disease, stage 3 (moderate): Secondary | ICD-10-CM | POA: Diagnosis not present

## 2015-05-18 DIAGNOSIS — K769 Liver disease, unspecified: Secondary | ICD-10-CM | POA: Diagnosis not present

## 2015-05-18 DIAGNOSIS — I480 Paroxysmal atrial fibrillation: Secondary | ICD-10-CM | POA: Diagnosis not present

## 2015-05-18 DIAGNOSIS — E1122 Type 2 diabetes mellitus with diabetic chronic kidney disease: Secondary | ICD-10-CM | POA: Diagnosis not present

## 2015-05-18 DIAGNOSIS — D509 Iron deficiency anemia, unspecified: Secondary | ICD-10-CM | POA: Diagnosis not present

## 2015-05-18 DIAGNOSIS — M549 Dorsalgia, unspecified: Secondary | ICD-10-CM | POA: Diagnosis not present

## 2015-05-18 DIAGNOSIS — Z9111 Patient's noncompliance with dietary regimen: Secondary | ICD-10-CM | POA: Diagnosis not present

## 2015-05-18 DIAGNOSIS — Z9981 Dependence on supplemental oxygen: Secondary | ICD-10-CM | POA: Diagnosis not present

## 2015-05-18 DIAGNOSIS — D631 Anemia in chronic kidney disease: Secondary | ICD-10-CM | POA: Diagnosis not present

## 2015-05-18 DIAGNOSIS — I5023 Acute on chronic systolic (congestive) heart failure: Secondary | ICD-10-CM | POA: Diagnosis not present

## 2015-05-18 DIAGNOSIS — I129 Hypertensive chronic kidney disease with stage 1 through stage 4 chronic kidney disease, or unspecified chronic kidney disease: Secondary | ICD-10-CM | POA: Diagnosis not present

## 2015-05-20 DIAGNOSIS — N186 End stage renal disease: Secondary | ICD-10-CM | POA: Diagnosis not present

## 2015-05-20 DIAGNOSIS — D509 Iron deficiency anemia, unspecified: Secondary | ICD-10-CM | POA: Diagnosis not present

## 2015-05-20 DIAGNOSIS — D631 Anemia in chronic kidney disease: Secondary | ICD-10-CM | POA: Diagnosis not present

## 2015-05-20 DIAGNOSIS — N2581 Secondary hyperparathyroidism of renal origin: Secondary | ICD-10-CM | POA: Diagnosis not present

## 2015-05-23 DIAGNOSIS — N186 End stage renal disease: Secondary | ICD-10-CM | POA: Diagnosis not present

## 2015-05-23 DIAGNOSIS — D509 Iron deficiency anemia, unspecified: Secondary | ICD-10-CM | POA: Diagnosis not present

## 2015-05-23 DIAGNOSIS — D631 Anemia in chronic kidney disease: Secondary | ICD-10-CM | POA: Diagnosis not present

## 2015-05-23 DIAGNOSIS — N2581 Secondary hyperparathyroidism of renal origin: Secondary | ICD-10-CM | POA: Diagnosis not present

## 2015-05-24 DIAGNOSIS — N183 Chronic kidney disease, stage 3 (moderate): Secondary | ICD-10-CM | POA: Diagnosis not present

## 2015-05-24 DIAGNOSIS — I129 Hypertensive chronic kidney disease with stage 1 through stage 4 chronic kidney disease, or unspecified chronic kidney disease: Secondary | ICD-10-CM | POA: Diagnosis not present

## 2015-05-24 DIAGNOSIS — J441 Chronic obstructive pulmonary disease with (acute) exacerbation: Secondary | ICD-10-CM | POA: Diagnosis not present

## 2015-05-24 DIAGNOSIS — Z720 Tobacco use: Secondary | ICD-10-CM | POA: Diagnosis not present

## 2015-05-24 DIAGNOSIS — Z9114 Patient's other noncompliance with medication regimen: Secondary | ICD-10-CM | POA: Diagnosis not present

## 2015-05-24 DIAGNOSIS — Z9981 Dependence on supplemental oxygen: Secondary | ICD-10-CM | POA: Diagnosis not present

## 2015-05-24 DIAGNOSIS — E1122 Type 2 diabetes mellitus with diabetic chronic kidney disease: Secondary | ICD-10-CM | POA: Diagnosis not present

## 2015-05-24 DIAGNOSIS — Z9111 Patient's noncompliance with dietary regimen: Secondary | ICD-10-CM | POA: Diagnosis not present

## 2015-05-24 DIAGNOSIS — I480 Paroxysmal atrial fibrillation: Secondary | ICD-10-CM | POA: Diagnosis not present

## 2015-05-24 DIAGNOSIS — I5023 Acute on chronic systolic (congestive) heart failure: Secondary | ICD-10-CM | POA: Diagnosis not present

## 2015-05-24 DIAGNOSIS — M549 Dorsalgia, unspecified: Secondary | ICD-10-CM | POA: Diagnosis not present

## 2015-05-24 DIAGNOSIS — Z794 Long term (current) use of insulin: Secondary | ICD-10-CM | POA: Diagnosis not present

## 2015-05-25 DIAGNOSIS — N2581 Secondary hyperparathyroidism of renal origin: Secondary | ICD-10-CM | POA: Diagnosis not present

## 2015-05-25 DIAGNOSIS — D631 Anemia in chronic kidney disease: Secondary | ICD-10-CM | POA: Diagnosis not present

## 2015-05-25 DIAGNOSIS — N186 End stage renal disease: Secondary | ICD-10-CM | POA: Diagnosis not present

## 2015-05-25 DIAGNOSIS — D509 Iron deficiency anemia, unspecified: Secondary | ICD-10-CM | POA: Diagnosis not present

## 2015-05-26 DIAGNOSIS — Z794 Long term (current) use of insulin: Secondary | ICD-10-CM | POA: Diagnosis not present

## 2015-05-26 DIAGNOSIS — M549 Dorsalgia, unspecified: Secondary | ICD-10-CM | POA: Diagnosis not present

## 2015-05-26 DIAGNOSIS — Z720 Tobacco use: Secondary | ICD-10-CM | POA: Diagnosis not present

## 2015-05-26 DIAGNOSIS — Z9114 Patient's other noncompliance with medication regimen: Secondary | ICD-10-CM | POA: Diagnosis not present

## 2015-05-26 DIAGNOSIS — J441 Chronic obstructive pulmonary disease with (acute) exacerbation: Secondary | ICD-10-CM | POA: Diagnosis not present

## 2015-05-26 DIAGNOSIS — E1122 Type 2 diabetes mellitus with diabetic chronic kidney disease: Secondary | ICD-10-CM | POA: Diagnosis not present

## 2015-05-26 DIAGNOSIS — I129 Hypertensive chronic kidney disease with stage 1 through stage 4 chronic kidney disease, or unspecified chronic kidney disease: Secondary | ICD-10-CM | POA: Diagnosis not present

## 2015-05-26 DIAGNOSIS — Z9981 Dependence on supplemental oxygen: Secondary | ICD-10-CM | POA: Diagnosis not present

## 2015-05-26 DIAGNOSIS — I480 Paroxysmal atrial fibrillation: Secondary | ICD-10-CM | POA: Diagnosis not present

## 2015-05-26 DIAGNOSIS — N183 Chronic kidney disease, stage 3 (moderate): Secondary | ICD-10-CM | POA: Diagnosis not present

## 2015-05-26 DIAGNOSIS — I5023 Acute on chronic systolic (congestive) heart failure: Secondary | ICD-10-CM | POA: Diagnosis not present

## 2015-05-26 DIAGNOSIS — Z9111 Patient's noncompliance with dietary regimen: Secondary | ICD-10-CM | POA: Diagnosis not present

## 2015-05-27 DIAGNOSIS — N2581 Secondary hyperparathyroidism of renal origin: Secondary | ICD-10-CM | POA: Diagnosis not present

## 2015-05-27 DIAGNOSIS — N186 End stage renal disease: Secondary | ICD-10-CM | POA: Diagnosis not present

## 2015-05-29 DIAGNOSIS — I10 Essential (primary) hypertension: Secondary | ICD-10-CM | POA: Diagnosis not present

## 2015-05-29 DIAGNOSIS — I70209 Unspecified atherosclerosis of native arteries of extremities, unspecified extremity: Secondary | ICD-10-CM | POA: Diagnosis not present

## 2015-05-29 DIAGNOSIS — Z720 Tobacco use: Secondary | ICD-10-CM | POA: Diagnosis not present

## 2015-05-29 DIAGNOSIS — E785 Hyperlipidemia, unspecified: Secondary | ICD-10-CM | POA: Diagnosis not present

## 2015-05-29 DIAGNOSIS — E119 Type 2 diabetes mellitus without complications: Secondary | ICD-10-CM | POA: Diagnosis not present

## 2015-05-30 DIAGNOSIS — N186 End stage renal disease: Secondary | ICD-10-CM | POA: Diagnosis not present

## 2015-05-30 DIAGNOSIS — N2581 Secondary hyperparathyroidism of renal origin: Secondary | ICD-10-CM | POA: Diagnosis not present

## 2015-05-31 DIAGNOSIS — E1122 Type 2 diabetes mellitus with diabetic chronic kidney disease: Secondary | ICD-10-CM | POA: Diagnosis not present

## 2015-05-31 DIAGNOSIS — I480 Paroxysmal atrial fibrillation: Secondary | ICD-10-CM | POA: Diagnosis not present

## 2015-05-31 DIAGNOSIS — Z9981 Dependence on supplemental oxygen: Secondary | ICD-10-CM | POA: Diagnosis not present

## 2015-05-31 DIAGNOSIS — I129 Hypertensive chronic kidney disease with stage 1 through stage 4 chronic kidney disease, or unspecified chronic kidney disease: Secondary | ICD-10-CM | POA: Diagnosis not present

## 2015-05-31 DIAGNOSIS — Z794 Long term (current) use of insulin: Secondary | ICD-10-CM | POA: Diagnosis not present

## 2015-05-31 DIAGNOSIS — J441 Chronic obstructive pulmonary disease with (acute) exacerbation: Secondary | ICD-10-CM | POA: Diagnosis not present

## 2015-05-31 DIAGNOSIS — M549 Dorsalgia, unspecified: Secondary | ICD-10-CM | POA: Diagnosis not present

## 2015-05-31 DIAGNOSIS — Z720 Tobacco use: Secondary | ICD-10-CM | POA: Diagnosis not present

## 2015-05-31 DIAGNOSIS — I5023 Acute on chronic systolic (congestive) heart failure: Secondary | ICD-10-CM | POA: Diagnosis not present

## 2015-05-31 DIAGNOSIS — Z9111 Patient's noncompliance with dietary regimen: Secondary | ICD-10-CM | POA: Diagnosis not present

## 2015-05-31 DIAGNOSIS — N183 Chronic kidney disease, stage 3 (moderate): Secondary | ICD-10-CM | POA: Diagnosis not present

## 2015-05-31 DIAGNOSIS — Z9114 Patient's other noncompliance with medication regimen: Secondary | ICD-10-CM | POA: Diagnosis not present

## 2015-06-01 DIAGNOSIS — N186 End stage renal disease: Secondary | ICD-10-CM | POA: Diagnosis not present

## 2015-06-01 DIAGNOSIS — N2581 Secondary hyperparathyroidism of renal origin: Secondary | ICD-10-CM | POA: Diagnosis not present

## 2015-06-02 DIAGNOSIS — Z794 Long term (current) use of insulin: Secondary | ICD-10-CM | POA: Diagnosis not present

## 2015-06-02 DIAGNOSIS — Z9114 Patient's other noncompliance with medication regimen: Secondary | ICD-10-CM | POA: Diagnosis not present

## 2015-06-02 DIAGNOSIS — N183 Chronic kidney disease, stage 3 (moderate): Secondary | ICD-10-CM | POA: Diagnosis not present

## 2015-06-02 DIAGNOSIS — E1122 Type 2 diabetes mellitus with diabetic chronic kidney disease: Secondary | ICD-10-CM | POA: Diagnosis not present

## 2015-06-02 DIAGNOSIS — M549 Dorsalgia, unspecified: Secondary | ICD-10-CM | POA: Diagnosis not present

## 2015-06-02 DIAGNOSIS — I5023 Acute on chronic systolic (congestive) heart failure: Secondary | ICD-10-CM | POA: Diagnosis not present

## 2015-06-02 DIAGNOSIS — Z9111 Patient's noncompliance with dietary regimen: Secondary | ICD-10-CM | POA: Diagnosis not present

## 2015-06-02 DIAGNOSIS — I129 Hypertensive chronic kidney disease with stage 1 through stage 4 chronic kidney disease, or unspecified chronic kidney disease: Secondary | ICD-10-CM | POA: Diagnosis not present

## 2015-06-02 DIAGNOSIS — Z9981 Dependence on supplemental oxygen: Secondary | ICD-10-CM | POA: Diagnosis not present

## 2015-06-02 DIAGNOSIS — J441 Chronic obstructive pulmonary disease with (acute) exacerbation: Secondary | ICD-10-CM | POA: Diagnosis not present

## 2015-06-02 DIAGNOSIS — Z720 Tobacco use: Secondary | ICD-10-CM | POA: Diagnosis not present

## 2015-06-02 DIAGNOSIS — I480 Paroxysmal atrial fibrillation: Secondary | ICD-10-CM | POA: Diagnosis not present

## 2015-06-03 DIAGNOSIS — N2581 Secondary hyperparathyroidism of renal origin: Secondary | ICD-10-CM | POA: Diagnosis not present

## 2015-06-03 DIAGNOSIS — N186 End stage renal disease: Secondary | ICD-10-CM | POA: Diagnosis not present

## 2015-06-05 DIAGNOSIS — N186 End stage renal disease: Secondary | ICD-10-CM | POA: Diagnosis not present

## 2015-06-05 DIAGNOSIS — N2581 Secondary hyperparathyroidism of renal origin: Secondary | ICD-10-CM | POA: Diagnosis not present

## 2015-06-07 DIAGNOSIS — N2581 Secondary hyperparathyroidism of renal origin: Secondary | ICD-10-CM | POA: Diagnosis not present

## 2015-06-07 DIAGNOSIS — N186 End stage renal disease: Secondary | ICD-10-CM | POA: Diagnosis not present

## 2015-06-09 DIAGNOSIS — J441 Chronic obstructive pulmonary disease with (acute) exacerbation: Secondary | ICD-10-CM | POA: Diagnosis not present

## 2015-06-09 DIAGNOSIS — E1122 Type 2 diabetes mellitus with diabetic chronic kidney disease: Secondary | ICD-10-CM | POA: Diagnosis not present

## 2015-06-09 DIAGNOSIS — Z720 Tobacco use: Secondary | ICD-10-CM | POA: Diagnosis not present

## 2015-06-09 DIAGNOSIS — I480 Paroxysmal atrial fibrillation: Secondary | ICD-10-CM | POA: Diagnosis not present

## 2015-06-09 DIAGNOSIS — Z794 Long term (current) use of insulin: Secondary | ICD-10-CM | POA: Diagnosis not present

## 2015-06-09 DIAGNOSIS — N183 Chronic kidney disease, stage 3 (moderate): Secondary | ICD-10-CM | POA: Diagnosis not present

## 2015-06-09 DIAGNOSIS — Z9111 Patient's noncompliance with dietary regimen: Secondary | ICD-10-CM | POA: Diagnosis not present

## 2015-06-09 DIAGNOSIS — Z9114 Patient's other noncompliance with medication regimen: Secondary | ICD-10-CM | POA: Diagnosis not present

## 2015-06-09 DIAGNOSIS — I5023 Acute on chronic systolic (congestive) heart failure: Secondary | ICD-10-CM | POA: Diagnosis not present

## 2015-06-09 DIAGNOSIS — I129 Hypertensive chronic kidney disease with stage 1 through stage 4 chronic kidney disease, or unspecified chronic kidney disease: Secondary | ICD-10-CM | POA: Diagnosis not present

## 2015-06-09 DIAGNOSIS — M549 Dorsalgia, unspecified: Secondary | ICD-10-CM | POA: Diagnosis not present

## 2015-06-09 DIAGNOSIS — Z9981 Dependence on supplemental oxygen: Secondary | ICD-10-CM | POA: Diagnosis not present

## 2015-06-10 DIAGNOSIS — N186 End stage renal disease: Secondary | ICD-10-CM | POA: Diagnosis not present

## 2015-06-10 DIAGNOSIS — N2581 Secondary hyperparathyroidism of renal origin: Secondary | ICD-10-CM | POA: Diagnosis not present

## 2015-06-10 DIAGNOSIS — J449 Chronic obstructive pulmonary disease, unspecified: Secondary | ICD-10-CM | POA: Diagnosis not present

## 2015-06-13 DIAGNOSIS — N2581 Secondary hyperparathyroidism of renal origin: Secondary | ICD-10-CM | POA: Diagnosis not present

## 2015-06-13 DIAGNOSIS — N186 End stage renal disease: Secondary | ICD-10-CM | POA: Diagnosis not present

## 2015-06-13 DIAGNOSIS — J449 Chronic obstructive pulmonary disease, unspecified: Secondary | ICD-10-CM | POA: Diagnosis not present

## 2015-06-15 DIAGNOSIS — Z9114 Patient's other noncompliance with medication regimen: Secondary | ICD-10-CM | POA: Diagnosis not present

## 2015-06-15 DIAGNOSIS — N183 Chronic kidney disease, stage 3 (moderate): Secondary | ICD-10-CM | POA: Diagnosis not present

## 2015-06-15 DIAGNOSIS — N2581 Secondary hyperparathyroidism of renal origin: Secondary | ICD-10-CM | POA: Diagnosis not present

## 2015-06-15 DIAGNOSIS — E1122 Type 2 diabetes mellitus with diabetic chronic kidney disease: Secondary | ICD-10-CM | POA: Diagnosis not present

## 2015-06-15 DIAGNOSIS — I480 Paroxysmal atrial fibrillation: Secondary | ICD-10-CM | POA: Diagnosis not present

## 2015-06-15 DIAGNOSIS — D631 Anemia in chronic kidney disease: Secondary | ICD-10-CM | POA: Diagnosis not present

## 2015-06-15 DIAGNOSIS — Z720 Tobacco use: Secondary | ICD-10-CM | POA: Diagnosis not present

## 2015-06-15 DIAGNOSIS — I5023 Acute on chronic systolic (congestive) heart failure: Secondary | ICD-10-CM | POA: Diagnosis not present

## 2015-06-15 DIAGNOSIS — Z794 Long term (current) use of insulin: Secondary | ICD-10-CM | POA: Diagnosis not present

## 2015-06-15 DIAGNOSIS — T8249XA Other complication of vascular dialysis catheter, initial encounter: Secondary | ICD-10-CM | POA: Diagnosis not present

## 2015-06-15 DIAGNOSIS — Z9981 Dependence on supplemental oxygen: Secondary | ICD-10-CM | POA: Diagnosis not present

## 2015-06-15 DIAGNOSIS — I129 Hypertensive chronic kidney disease with stage 1 through stage 4 chronic kidney disease, or unspecified chronic kidney disease: Secondary | ICD-10-CM | POA: Diagnosis not present

## 2015-06-15 DIAGNOSIS — Z9111 Patient's noncompliance with dietary regimen: Secondary | ICD-10-CM | POA: Diagnosis not present

## 2015-06-15 DIAGNOSIS — I10 Essential (primary) hypertension: Secondary | ICD-10-CM | POA: Diagnosis not present

## 2015-06-15 DIAGNOSIS — J441 Chronic obstructive pulmonary disease with (acute) exacerbation: Secondary | ICD-10-CM | POA: Diagnosis not present

## 2015-06-15 DIAGNOSIS — N186 End stage renal disease: Secondary | ICD-10-CM | POA: Diagnosis not present

## 2015-06-15 DIAGNOSIS — M549 Dorsalgia, unspecified: Secondary | ICD-10-CM | POA: Diagnosis not present

## 2015-06-17 DIAGNOSIS — N186 End stage renal disease: Secondary | ICD-10-CM | POA: Diagnosis not present

## 2015-06-17 DIAGNOSIS — D631 Anemia in chronic kidney disease: Secondary | ICD-10-CM | POA: Diagnosis not present

## 2015-06-17 DIAGNOSIS — N2581 Secondary hyperparathyroidism of renal origin: Secondary | ICD-10-CM | POA: Diagnosis not present

## 2015-06-17 DIAGNOSIS — T8249XA Other complication of vascular dialysis catheter, initial encounter: Secondary | ICD-10-CM | POA: Diagnosis not present

## 2015-06-19 DIAGNOSIS — E785 Hyperlipidemia, unspecified: Secondary | ICD-10-CM | POA: Diagnosis not present

## 2015-06-19 DIAGNOSIS — I10 Essential (primary) hypertension: Secondary | ICD-10-CM | POA: Diagnosis not present

## 2015-06-19 DIAGNOSIS — E119 Type 2 diabetes mellitus without complications: Secondary | ICD-10-CM | POA: Diagnosis not present

## 2015-06-19 DIAGNOSIS — Z720 Tobacco use: Secondary | ICD-10-CM | POA: Diagnosis not present

## 2015-06-19 DIAGNOSIS — I70209 Unspecified atherosclerosis of native arteries of extremities, unspecified extremity: Secondary | ICD-10-CM | POA: Diagnosis not present

## 2015-06-20 DIAGNOSIS — N186 End stage renal disease: Secondary | ICD-10-CM | POA: Diagnosis not present

## 2015-06-20 DIAGNOSIS — N2581 Secondary hyperparathyroidism of renal origin: Secondary | ICD-10-CM | POA: Diagnosis not present

## 2015-06-20 DIAGNOSIS — D631 Anemia in chronic kidney disease: Secondary | ICD-10-CM | POA: Diagnosis not present

## 2015-06-20 DIAGNOSIS — T8249XA Other complication of vascular dialysis catheter, initial encounter: Secondary | ICD-10-CM | POA: Diagnosis not present

## 2015-06-22 DIAGNOSIS — E118 Type 2 diabetes mellitus with unspecified complications: Secondary | ICD-10-CM | POA: Diagnosis not present

## 2015-06-22 DIAGNOSIS — N186 End stage renal disease: Secondary | ICD-10-CM | POA: Diagnosis not present

## 2015-06-22 DIAGNOSIS — N2581 Secondary hyperparathyroidism of renal origin: Secondary | ICD-10-CM | POA: Diagnosis not present

## 2015-06-22 DIAGNOSIS — T8249XA Other complication of vascular dialysis catheter, initial encounter: Secondary | ICD-10-CM | POA: Diagnosis not present

## 2015-06-22 DIAGNOSIS — K769 Liver disease, unspecified: Secondary | ICD-10-CM | POA: Diagnosis not present

## 2015-06-22 DIAGNOSIS — D631 Anemia in chronic kidney disease: Secondary | ICD-10-CM | POA: Diagnosis not present

## 2015-06-24 DIAGNOSIS — N2581 Secondary hyperparathyroidism of renal origin: Secondary | ICD-10-CM | POA: Diagnosis not present

## 2015-06-24 DIAGNOSIS — T8249XA Other complication of vascular dialysis catheter, initial encounter: Secondary | ICD-10-CM | POA: Diagnosis not present

## 2015-06-24 DIAGNOSIS — D631 Anemia in chronic kidney disease: Secondary | ICD-10-CM | POA: Diagnosis not present

## 2015-06-24 DIAGNOSIS — N186 End stage renal disease: Secondary | ICD-10-CM | POA: Diagnosis not present

## 2015-06-27 DIAGNOSIS — D509 Iron deficiency anemia, unspecified: Secondary | ICD-10-CM | POA: Diagnosis not present

## 2015-06-27 DIAGNOSIS — N186 End stage renal disease: Secondary | ICD-10-CM | POA: Diagnosis not present

## 2015-06-27 DIAGNOSIS — N2581 Secondary hyperparathyroidism of renal origin: Secondary | ICD-10-CM | POA: Diagnosis not present

## 2015-06-29 DIAGNOSIS — N186 End stage renal disease: Secondary | ICD-10-CM | POA: Diagnosis not present

## 2015-06-29 DIAGNOSIS — N2581 Secondary hyperparathyroidism of renal origin: Secondary | ICD-10-CM | POA: Diagnosis not present

## 2015-06-29 DIAGNOSIS — D509 Iron deficiency anemia, unspecified: Secondary | ICD-10-CM | POA: Diagnosis not present

## 2015-07-01 DIAGNOSIS — D509 Iron deficiency anemia, unspecified: Secondary | ICD-10-CM | POA: Diagnosis not present

## 2015-07-01 DIAGNOSIS — N2581 Secondary hyperparathyroidism of renal origin: Secondary | ICD-10-CM | POA: Diagnosis not present

## 2015-07-01 DIAGNOSIS — N186 End stage renal disease: Secondary | ICD-10-CM | POA: Diagnosis not present

## 2015-07-03 DIAGNOSIS — N186 End stage renal disease: Secondary | ICD-10-CM | POA: Diagnosis not present

## 2015-07-03 DIAGNOSIS — I120 Hypertensive chronic kidney disease with stage 5 chronic kidney disease or end stage renal disease: Secondary | ICD-10-CM | POA: Diagnosis not present

## 2015-07-03 DIAGNOSIS — E1122 Type 2 diabetes mellitus with diabetic chronic kidney disease: Secondary | ICD-10-CM | POA: Diagnosis not present

## 2015-07-03 DIAGNOSIS — I429 Cardiomyopathy, unspecified: Secondary | ICD-10-CM | POA: Diagnosis not present

## 2015-07-03 DIAGNOSIS — E1169 Type 2 diabetes mellitus with other specified complication: Secondary | ICD-10-CM | POA: Diagnosis not present

## 2015-07-04 DIAGNOSIS — D509 Iron deficiency anemia, unspecified: Secondary | ICD-10-CM | POA: Diagnosis not present

## 2015-07-04 DIAGNOSIS — N2581 Secondary hyperparathyroidism of renal origin: Secondary | ICD-10-CM | POA: Diagnosis not present

## 2015-07-04 DIAGNOSIS — N186 End stage renal disease: Secondary | ICD-10-CM | POA: Diagnosis not present

## 2015-07-06 DIAGNOSIS — N2581 Secondary hyperparathyroidism of renal origin: Secondary | ICD-10-CM | POA: Diagnosis not present

## 2015-07-06 DIAGNOSIS — D509 Iron deficiency anemia, unspecified: Secondary | ICD-10-CM | POA: Diagnosis not present

## 2015-07-06 DIAGNOSIS — N186 End stage renal disease: Secondary | ICD-10-CM | POA: Diagnosis not present

## 2015-07-08 DIAGNOSIS — N186 End stage renal disease: Secondary | ICD-10-CM | POA: Diagnosis not present

## 2015-07-08 DIAGNOSIS — N2581 Secondary hyperparathyroidism of renal origin: Secondary | ICD-10-CM | POA: Diagnosis not present

## 2015-07-08 DIAGNOSIS — D509 Iron deficiency anemia, unspecified: Secondary | ICD-10-CM | POA: Diagnosis not present

## 2015-07-10 DIAGNOSIS — J449 Chronic obstructive pulmonary disease, unspecified: Secondary | ICD-10-CM | POA: Diagnosis not present

## 2015-07-11 DIAGNOSIS — N2581 Secondary hyperparathyroidism of renal origin: Secondary | ICD-10-CM | POA: Diagnosis not present

## 2015-07-11 DIAGNOSIS — D509 Iron deficiency anemia, unspecified: Secondary | ICD-10-CM | POA: Diagnosis not present

## 2015-07-11 DIAGNOSIS — N186 End stage renal disease: Secondary | ICD-10-CM | POA: Diagnosis not present

## 2015-07-13 DIAGNOSIS — N2581 Secondary hyperparathyroidism of renal origin: Secondary | ICD-10-CM | POA: Diagnosis not present

## 2015-07-13 DIAGNOSIS — J449 Chronic obstructive pulmonary disease, unspecified: Secondary | ICD-10-CM | POA: Diagnosis not present

## 2015-07-13 DIAGNOSIS — N186 End stage renal disease: Secondary | ICD-10-CM | POA: Diagnosis not present

## 2015-07-13 DIAGNOSIS — D509 Iron deficiency anemia, unspecified: Secondary | ICD-10-CM | POA: Diagnosis not present

## 2015-07-15 DIAGNOSIS — N2581 Secondary hyperparathyroidism of renal origin: Secondary | ICD-10-CM | POA: Diagnosis not present

## 2015-07-15 DIAGNOSIS — N186 End stage renal disease: Secondary | ICD-10-CM | POA: Diagnosis not present

## 2015-07-15 DIAGNOSIS — D509 Iron deficiency anemia, unspecified: Secondary | ICD-10-CM | POA: Diagnosis not present

## 2015-07-16 DIAGNOSIS — N186 End stage renal disease: Secondary | ICD-10-CM | POA: Diagnosis not present

## 2015-07-16 DIAGNOSIS — D631 Anemia in chronic kidney disease: Secondary | ICD-10-CM | POA: Diagnosis not present

## 2015-07-16 DIAGNOSIS — I10 Essential (primary) hypertension: Secondary | ICD-10-CM | POA: Diagnosis not present

## 2015-07-18 DIAGNOSIS — D631 Anemia in chronic kidney disease: Secondary | ICD-10-CM | POA: Diagnosis not present

## 2015-07-18 DIAGNOSIS — T8249XA Other complication of vascular dialysis catheter, initial encounter: Secondary | ICD-10-CM | POA: Diagnosis not present

## 2015-07-18 DIAGNOSIS — N2581 Secondary hyperparathyroidism of renal origin: Secondary | ICD-10-CM | POA: Diagnosis not present

## 2015-07-18 DIAGNOSIS — N186 End stage renal disease: Secondary | ICD-10-CM | POA: Diagnosis not present

## 2015-07-18 DIAGNOSIS — D509 Iron deficiency anemia, unspecified: Secondary | ICD-10-CM | POA: Diagnosis not present

## 2015-07-24 DIAGNOSIS — Z992 Dependence on renal dialysis: Secondary | ICD-10-CM | POA: Diagnosis not present

## 2015-07-24 DIAGNOSIS — R0602 Shortness of breath: Secondary | ICD-10-CM | POA: Diagnosis not present

## 2015-07-24 DIAGNOSIS — Z9981 Dependence on supplemental oxygen: Secondary | ICD-10-CM | POA: Diagnosis not present

## 2015-07-24 DIAGNOSIS — N186 End stage renal disease: Secondary | ICD-10-CM | POA: Diagnosis not present

## 2015-07-25 DIAGNOSIS — T8249XA Other complication of vascular dialysis catheter, initial encounter: Secondary | ICD-10-CM | POA: Diagnosis not present

## 2015-07-25 DIAGNOSIS — D509 Iron deficiency anemia, unspecified: Secondary | ICD-10-CM | POA: Diagnosis not present

## 2015-07-25 DIAGNOSIS — D631 Anemia in chronic kidney disease: Secondary | ICD-10-CM | POA: Diagnosis not present

## 2015-07-25 DIAGNOSIS — N186 End stage renal disease: Secondary | ICD-10-CM | POA: Diagnosis not present

## 2015-07-25 DIAGNOSIS — E118 Type 2 diabetes mellitus with unspecified complications: Secondary | ICD-10-CM | POA: Diagnosis not present

## 2015-07-25 DIAGNOSIS — N2581 Secondary hyperparathyroidism of renal origin: Secondary | ICD-10-CM | POA: Diagnosis not present

## 2015-07-25 DIAGNOSIS — K769 Liver disease, unspecified: Secondary | ICD-10-CM | POA: Diagnosis not present

## 2015-07-27 DIAGNOSIS — N186 End stage renal disease: Secondary | ICD-10-CM | POA: Diagnosis not present

## 2015-07-27 DIAGNOSIS — N2581 Secondary hyperparathyroidism of renal origin: Secondary | ICD-10-CM | POA: Diagnosis not present

## 2015-08-01 DIAGNOSIS — N2581 Secondary hyperparathyroidism of renal origin: Secondary | ICD-10-CM | POA: Diagnosis not present

## 2015-08-01 DIAGNOSIS — N186 End stage renal disease: Secondary | ICD-10-CM | POA: Diagnosis not present

## 2015-08-03 DIAGNOSIS — N2581 Secondary hyperparathyroidism of renal origin: Secondary | ICD-10-CM | POA: Diagnosis not present

## 2015-08-03 DIAGNOSIS — N186 End stage renal disease: Secondary | ICD-10-CM | POA: Diagnosis not present

## 2015-08-08 DIAGNOSIS — D509 Iron deficiency anemia, unspecified: Secondary | ICD-10-CM | POA: Diagnosis not present

## 2015-08-08 DIAGNOSIS — N186 End stage renal disease: Secondary | ICD-10-CM | POA: Diagnosis not present

## 2015-08-08 DIAGNOSIS — N2581 Secondary hyperparathyroidism of renal origin: Secondary | ICD-10-CM | POA: Diagnosis not present

## 2015-08-08 DIAGNOSIS — T8249XA Other complication of vascular dialysis catheter, initial encounter: Secondary | ICD-10-CM | POA: Diagnosis not present

## 2015-08-10 DIAGNOSIS — N186 End stage renal disease: Secondary | ICD-10-CM | POA: Diagnosis not present

## 2015-08-10 DIAGNOSIS — N2581 Secondary hyperparathyroidism of renal origin: Secondary | ICD-10-CM | POA: Diagnosis not present

## 2015-08-10 DIAGNOSIS — J449 Chronic obstructive pulmonary disease, unspecified: Secondary | ICD-10-CM | POA: Diagnosis not present

## 2015-08-10 DIAGNOSIS — T8249XA Other complication of vascular dialysis catheter, initial encounter: Secondary | ICD-10-CM | POA: Diagnosis not present

## 2015-08-10 DIAGNOSIS — D509 Iron deficiency anemia, unspecified: Secondary | ICD-10-CM | POA: Diagnosis not present

## 2015-08-13 DIAGNOSIS — J449 Chronic obstructive pulmonary disease, unspecified: Secondary | ICD-10-CM | POA: Diagnosis not present

## 2015-08-15 DIAGNOSIS — D509 Iron deficiency anemia, unspecified: Secondary | ICD-10-CM | POA: Diagnosis not present

## 2015-08-15 DIAGNOSIS — N2581 Secondary hyperparathyroidism of renal origin: Secondary | ICD-10-CM | POA: Diagnosis not present

## 2015-08-15 DIAGNOSIS — T8249XA Other complication of vascular dialysis catheter, initial encounter: Secondary | ICD-10-CM | POA: Diagnosis not present

## 2015-08-15 DIAGNOSIS — N186 End stage renal disease: Secondary | ICD-10-CM | POA: Diagnosis not present

## 2015-08-16 DIAGNOSIS — I10 Essential (primary) hypertension: Secondary | ICD-10-CM | POA: Diagnosis not present

## 2015-08-16 DIAGNOSIS — N186 End stage renal disease: Secondary | ICD-10-CM | POA: Diagnosis not present

## 2015-08-16 DIAGNOSIS — D631 Anemia in chronic kidney disease: Secondary | ICD-10-CM | POA: Diagnosis not present

## 2015-08-17 DIAGNOSIS — D631 Anemia in chronic kidney disease: Secondary | ICD-10-CM | POA: Diagnosis not present

## 2015-08-17 DIAGNOSIS — N2581 Secondary hyperparathyroidism of renal origin: Secondary | ICD-10-CM | POA: Diagnosis not present

## 2015-08-17 DIAGNOSIS — E1121 Type 2 diabetes mellitus with diabetic nephropathy: Secondary | ICD-10-CM | POA: Diagnosis not present

## 2015-08-17 DIAGNOSIS — K769 Liver disease, unspecified: Secondary | ICD-10-CM | POA: Diagnosis not present

## 2015-08-17 DIAGNOSIS — N186 End stage renal disease: Secondary | ICD-10-CM | POA: Diagnosis not present

## 2015-08-21 DIAGNOSIS — N186 End stage renal disease: Secondary | ICD-10-CM | POA: Diagnosis not present

## 2015-08-22 DIAGNOSIS — D631 Anemia in chronic kidney disease: Secondary | ICD-10-CM | POA: Diagnosis not present

## 2015-08-22 DIAGNOSIS — N2581 Secondary hyperparathyroidism of renal origin: Secondary | ICD-10-CM | POA: Diagnosis not present

## 2015-08-22 DIAGNOSIS — N186 End stage renal disease: Secondary | ICD-10-CM | POA: Diagnosis not present

## 2015-08-23 DIAGNOSIS — I420 Dilated cardiomyopathy: Secondary | ICD-10-CM | POA: Diagnosis not present

## 2015-08-23 DIAGNOSIS — N186 End stage renal disease: Secondary | ICD-10-CM | POA: Diagnosis not present

## 2015-08-23 DIAGNOSIS — G589 Mononeuropathy, unspecified: Secondary | ICD-10-CM | POA: Diagnosis not present

## 2015-08-23 DIAGNOSIS — E785 Hyperlipidemia, unspecified: Secondary | ICD-10-CM | POA: Diagnosis not present

## 2015-08-23 DIAGNOSIS — Z7984 Long term (current) use of oral hypoglycemic drugs: Secondary | ICD-10-CM | POA: Diagnosis not present

## 2015-08-23 DIAGNOSIS — J449 Chronic obstructive pulmonary disease, unspecified: Secondary | ICD-10-CM | POA: Diagnosis not present

## 2015-08-23 DIAGNOSIS — E11618 Type 2 diabetes mellitus with other diabetic arthropathy: Secondary | ICD-10-CM | POA: Diagnosis not present

## 2015-08-23 DIAGNOSIS — D631 Anemia in chronic kidney disease: Secondary | ICD-10-CM | POA: Diagnosis not present

## 2015-08-23 DIAGNOSIS — K219 Gastro-esophageal reflux disease without esophagitis: Secondary | ICD-10-CM | POA: Diagnosis not present

## 2015-08-23 DIAGNOSIS — R1013 Epigastric pain: Secondary | ICD-10-CM | POA: Diagnosis not present

## 2015-08-23 DIAGNOSIS — I10 Essential (primary) hypertension: Secondary | ICD-10-CM | POA: Diagnosis not present

## 2015-08-23 DIAGNOSIS — Z794 Long term (current) use of insulin: Secondary | ICD-10-CM | POA: Diagnosis not present

## 2015-08-23 DIAGNOSIS — I132 Hypertensive heart and chronic kidney disease with heart failure and with stage 5 chronic kidney disease, or end stage renal disease: Secondary | ICD-10-CM | POA: Diagnosis not present

## 2015-08-23 DIAGNOSIS — E1122 Type 2 diabetes mellitus with diabetic chronic kidney disease: Secondary | ICD-10-CM | POA: Diagnosis not present

## 2015-08-23 DIAGNOSIS — I509 Heart failure, unspecified: Secondary | ICD-10-CM | POA: Diagnosis not present

## 2015-08-23 DIAGNOSIS — T82898A Other specified complication of vascular prosthetic devices, implants and grafts, initial encounter: Secondary | ICD-10-CM | POA: Diagnosis not present

## 2015-08-23 DIAGNOSIS — J45909 Unspecified asthma, uncomplicated: Secondary | ICD-10-CM | POA: Diagnosis not present

## 2015-08-23 DIAGNOSIS — Z9111 Patient's noncompliance with dietary regimen: Secondary | ICD-10-CM | POA: Diagnosis not present

## 2015-08-23 DIAGNOSIS — Z7982 Long term (current) use of aspirin: Secondary | ICD-10-CM | POA: Diagnosis not present

## 2015-08-23 DIAGNOSIS — I2510 Atherosclerotic heart disease of native coronary artery without angina pectoris: Secondary | ICD-10-CM | POA: Diagnosis not present

## 2015-08-23 DIAGNOSIS — Z992 Dependence on renal dialysis: Secondary | ICD-10-CM | POA: Diagnosis not present

## 2015-08-23 DIAGNOSIS — Z79899 Other long term (current) drug therapy: Secondary | ICD-10-CM | POA: Diagnosis not present

## 2015-08-23 DIAGNOSIS — F17210 Nicotine dependence, cigarettes, uncomplicated: Secondary | ICD-10-CM | POA: Diagnosis not present

## 2015-08-24 DIAGNOSIS — Z9111 Patient's noncompliance with dietary regimen: Secondary | ICD-10-CM | POA: Diagnosis not present

## 2015-08-24 DIAGNOSIS — D631 Anemia in chronic kidney disease: Secondary | ICD-10-CM | POA: Diagnosis not present

## 2015-08-24 DIAGNOSIS — Z794 Long term (current) use of insulin: Secondary | ICD-10-CM | POA: Diagnosis not present

## 2015-08-24 DIAGNOSIS — I132 Hypertensive heart and chronic kidney disease with heart failure and with stage 5 chronic kidney disease, or end stage renal disease: Secondary | ICD-10-CM | POA: Diagnosis not present

## 2015-08-24 DIAGNOSIS — G589 Mononeuropathy, unspecified: Secondary | ICD-10-CM | POA: Diagnosis not present

## 2015-08-24 DIAGNOSIS — E785 Hyperlipidemia, unspecified: Secondary | ICD-10-CM | POA: Diagnosis not present

## 2015-08-24 DIAGNOSIS — I2510 Atherosclerotic heart disease of native coronary artery without angina pectoris: Secondary | ICD-10-CM | POA: Diagnosis not present

## 2015-08-24 DIAGNOSIS — I10 Essential (primary) hypertension: Secondary | ICD-10-CM | POA: Diagnosis not present

## 2015-08-24 DIAGNOSIS — R1013 Epigastric pain: Secondary | ICD-10-CM | POA: Diagnosis not present

## 2015-08-24 DIAGNOSIS — E11618 Type 2 diabetes mellitus with other diabetic arthropathy: Secondary | ICD-10-CM | POA: Diagnosis not present

## 2015-08-24 DIAGNOSIS — Z7984 Long term (current) use of oral hypoglycemic drugs: Secondary | ICD-10-CM | POA: Diagnosis not present

## 2015-08-24 DIAGNOSIS — K219 Gastro-esophageal reflux disease without esophagitis: Secondary | ICD-10-CM | POA: Diagnosis not present

## 2015-08-24 DIAGNOSIS — Z992 Dependence on renal dialysis: Secondary | ICD-10-CM | POA: Diagnosis not present

## 2015-08-24 DIAGNOSIS — T82898A Other specified complication of vascular prosthetic devices, implants and grafts, initial encounter: Secondary | ICD-10-CM | POA: Diagnosis not present

## 2015-08-24 DIAGNOSIS — J449 Chronic obstructive pulmonary disease, unspecified: Secondary | ICD-10-CM | POA: Diagnosis not present

## 2015-08-24 DIAGNOSIS — I509 Heart failure, unspecified: Secondary | ICD-10-CM | POA: Diagnosis not present

## 2015-08-24 DIAGNOSIS — N186 End stage renal disease: Secondary | ICD-10-CM | POA: Diagnosis not present

## 2015-08-24 DIAGNOSIS — Z79899 Other long term (current) drug therapy: Secondary | ICD-10-CM | POA: Diagnosis not present

## 2015-08-24 DIAGNOSIS — E1122 Type 2 diabetes mellitus with diabetic chronic kidney disease: Secondary | ICD-10-CM | POA: Diagnosis not present

## 2015-08-24 DIAGNOSIS — Z7982 Long term (current) use of aspirin: Secondary | ICD-10-CM | POA: Diagnosis not present

## 2015-08-24 DIAGNOSIS — I420 Dilated cardiomyopathy: Secondary | ICD-10-CM | POA: Diagnosis not present

## 2015-08-24 DIAGNOSIS — J45909 Unspecified asthma, uncomplicated: Secondary | ICD-10-CM | POA: Diagnosis not present

## 2015-08-24 DIAGNOSIS — L988 Other specified disorders of the skin and subcutaneous tissue: Secondary | ICD-10-CM | POA: Diagnosis not present

## 2015-08-25 DIAGNOSIS — G589 Mononeuropathy, unspecified: Secondary | ICD-10-CM | POA: Diagnosis not present

## 2015-08-25 DIAGNOSIS — Z7984 Long term (current) use of oral hypoglycemic drugs: Secondary | ICD-10-CM | POA: Diagnosis not present

## 2015-08-25 DIAGNOSIS — F17210 Nicotine dependence, cigarettes, uncomplicated: Secondary | ICD-10-CM | POA: Diagnosis not present

## 2015-08-25 DIAGNOSIS — E785 Hyperlipidemia, unspecified: Secondary | ICD-10-CM | POA: Diagnosis not present

## 2015-08-25 DIAGNOSIS — I509 Heart failure, unspecified: Secondary | ICD-10-CM | POA: Diagnosis not present

## 2015-08-25 DIAGNOSIS — E11618 Type 2 diabetes mellitus with other diabetic arthropathy: Secondary | ICD-10-CM | POA: Diagnosis not present

## 2015-08-25 DIAGNOSIS — Z992 Dependence on renal dialysis: Secondary | ICD-10-CM | POA: Diagnosis not present

## 2015-08-25 DIAGNOSIS — Z79899 Other long term (current) drug therapy: Secondary | ICD-10-CM | POA: Diagnosis not present

## 2015-08-25 DIAGNOSIS — I132 Hypertensive heart and chronic kidney disease with heart failure and with stage 5 chronic kidney disease, or end stage renal disease: Secondary | ICD-10-CM | POA: Diagnosis not present

## 2015-08-25 DIAGNOSIS — E1122 Type 2 diabetes mellitus with diabetic chronic kidney disease: Secondary | ICD-10-CM | POA: Diagnosis not present

## 2015-08-25 DIAGNOSIS — D631 Anemia in chronic kidney disease: Secondary | ICD-10-CM | POA: Diagnosis not present

## 2015-08-25 DIAGNOSIS — T82898A Other specified complication of vascular prosthetic devices, implants and grafts, initial encounter: Secondary | ICD-10-CM | POA: Diagnosis not present

## 2015-08-25 DIAGNOSIS — Z794 Long term (current) use of insulin: Secondary | ICD-10-CM | POA: Diagnosis not present

## 2015-08-25 DIAGNOSIS — I2510 Atherosclerotic heart disease of native coronary artery without angina pectoris: Secondary | ICD-10-CM | POA: Diagnosis not present

## 2015-08-25 DIAGNOSIS — I10 Essential (primary) hypertension: Secondary | ICD-10-CM | POA: Diagnosis not present

## 2015-08-25 DIAGNOSIS — J449 Chronic obstructive pulmonary disease, unspecified: Secondary | ICD-10-CM | POA: Diagnosis not present

## 2015-08-25 DIAGNOSIS — Z7982 Long term (current) use of aspirin: Secondary | ICD-10-CM | POA: Diagnosis not present

## 2015-08-25 DIAGNOSIS — N186 End stage renal disease: Secondary | ICD-10-CM | POA: Diagnosis not present

## 2015-08-25 DIAGNOSIS — Z9111 Patient's noncompliance with dietary regimen: Secondary | ICD-10-CM | POA: Diagnosis not present

## 2015-08-25 DIAGNOSIS — K219 Gastro-esophageal reflux disease without esophagitis: Secondary | ICD-10-CM | POA: Diagnosis not present

## 2015-08-25 DIAGNOSIS — R1013 Epigastric pain: Secondary | ICD-10-CM | POA: Diagnosis not present

## 2015-08-25 DIAGNOSIS — J45909 Unspecified asthma, uncomplicated: Secondary | ICD-10-CM | POA: Diagnosis not present

## 2015-08-25 DIAGNOSIS — I420 Dilated cardiomyopathy: Secondary | ICD-10-CM | POA: Diagnosis not present

## 2015-08-26 DIAGNOSIS — T82898A Other specified complication of vascular prosthetic devices, implants and grafts, initial encounter: Secondary | ICD-10-CM | POA: Diagnosis not present

## 2015-08-26 DIAGNOSIS — Z79899 Other long term (current) drug therapy: Secondary | ICD-10-CM | POA: Diagnosis not present

## 2015-08-26 DIAGNOSIS — Z794 Long term (current) use of insulin: Secondary | ICD-10-CM | POA: Diagnosis not present

## 2015-08-26 DIAGNOSIS — I420 Dilated cardiomyopathy: Secondary | ICD-10-CM | POA: Diagnosis not present

## 2015-08-26 DIAGNOSIS — I2510 Atherosclerotic heart disease of native coronary artery without angina pectoris: Secondary | ICD-10-CM | POA: Diagnosis not present

## 2015-08-26 DIAGNOSIS — Z7982 Long term (current) use of aspirin: Secondary | ICD-10-CM | POA: Diagnosis not present

## 2015-08-26 DIAGNOSIS — I119 Hypertensive heart disease without heart failure: Secondary | ICD-10-CM | POA: Diagnosis not present

## 2015-08-26 DIAGNOSIS — E1122 Type 2 diabetes mellitus with diabetic chronic kidney disease: Secondary | ICD-10-CM | POA: Diagnosis not present

## 2015-08-26 DIAGNOSIS — Z992 Dependence on renal dialysis: Secondary | ICD-10-CM | POA: Diagnosis not present

## 2015-08-26 DIAGNOSIS — I509 Heart failure, unspecified: Secondary | ICD-10-CM | POA: Diagnosis not present

## 2015-08-26 DIAGNOSIS — E11618 Type 2 diabetes mellitus with other diabetic arthropathy: Secondary | ICD-10-CM | POA: Diagnosis not present

## 2015-08-26 DIAGNOSIS — J45909 Unspecified asthma, uncomplicated: Secondary | ICD-10-CM | POA: Diagnosis not present

## 2015-08-26 DIAGNOSIS — I132 Hypertensive heart and chronic kidney disease with heart failure and with stage 5 chronic kidney disease, or end stage renal disease: Secondary | ICD-10-CM | POA: Diagnosis not present

## 2015-08-26 DIAGNOSIS — G589 Mononeuropathy, unspecified: Secondary | ICD-10-CM | POA: Diagnosis not present

## 2015-08-26 DIAGNOSIS — K219 Gastro-esophageal reflux disease without esophagitis: Secondary | ICD-10-CM | POA: Diagnosis not present

## 2015-08-26 DIAGNOSIS — J449 Chronic obstructive pulmonary disease, unspecified: Secondary | ICD-10-CM | POA: Diagnosis not present

## 2015-08-26 DIAGNOSIS — E785 Hyperlipidemia, unspecified: Secondary | ICD-10-CM | POA: Diagnosis not present

## 2015-08-26 DIAGNOSIS — Z9111 Patient's noncompliance with dietary regimen: Secondary | ICD-10-CM | POA: Diagnosis not present

## 2015-08-26 DIAGNOSIS — Z7984 Long term (current) use of oral hypoglycemic drugs: Secondary | ICD-10-CM | POA: Diagnosis not present

## 2015-08-26 DIAGNOSIS — N186 End stage renal disease: Secondary | ICD-10-CM | POA: Diagnosis not present

## 2015-08-26 DIAGNOSIS — R1013 Epigastric pain: Secondary | ICD-10-CM | POA: Diagnosis not present

## 2015-08-27 DIAGNOSIS — Z992 Dependence on renal dialysis: Secondary | ICD-10-CM | POA: Diagnosis not present

## 2015-08-27 DIAGNOSIS — G589 Mononeuropathy, unspecified: Secondary | ICD-10-CM | POA: Diagnosis not present

## 2015-08-27 DIAGNOSIS — T82898A Other specified complication of vascular prosthetic devices, implants and grafts, initial encounter: Secondary | ICD-10-CM | POA: Diagnosis not present

## 2015-08-27 DIAGNOSIS — I2510 Atherosclerotic heart disease of native coronary artery without angina pectoris: Secondary | ICD-10-CM | POA: Diagnosis not present

## 2015-08-27 DIAGNOSIS — K219 Gastro-esophageal reflux disease without esophagitis: Secondary | ICD-10-CM | POA: Diagnosis not present

## 2015-08-27 DIAGNOSIS — I509 Heart failure, unspecified: Secondary | ICD-10-CM | POA: Diagnosis not present

## 2015-08-27 DIAGNOSIS — Z7982 Long term (current) use of aspirin: Secondary | ICD-10-CM | POA: Diagnosis not present

## 2015-08-27 DIAGNOSIS — I132 Hypertensive heart and chronic kidney disease with heart failure and with stage 5 chronic kidney disease, or end stage renal disease: Secondary | ICD-10-CM | POA: Diagnosis not present

## 2015-08-27 DIAGNOSIS — Z79899 Other long term (current) drug therapy: Secondary | ICD-10-CM | POA: Diagnosis not present

## 2015-08-27 DIAGNOSIS — E1122 Type 2 diabetes mellitus with diabetic chronic kidney disease: Secondary | ICD-10-CM | POA: Diagnosis not present

## 2015-08-27 DIAGNOSIS — E785 Hyperlipidemia, unspecified: Secondary | ICD-10-CM | POA: Diagnosis not present

## 2015-08-27 DIAGNOSIS — Z9111 Patient's noncompliance with dietary regimen: Secondary | ICD-10-CM | POA: Diagnosis not present

## 2015-08-27 DIAGNOSIS — I119 Hypertensive heart disease without heart failure: Secondary | ICD-10-CM | POA: Diagnosis not present

## 2015-08-27 DIAGNOSIS — Z7984 Long term (current) use of oral hypoglycemic drugs: Secondary | ICD-10-CM | POA: Diagnosis not present

## 2015-08-27 DIAGNOSIS — J449 Chronic obstructive pulmonary disease, unspecified: Secondary | ICD-10-CM | POA: Diagnosis not present

## 2015-08-27 DIAGNOSIS — Z794 Long term (current) use of insulin: Secondary | ICD-10-CM | POA: Diagnosis not present

## 2015-08-27 DIAGNOSIS — E11618 Type 2 diabetes mellitus with other diabetic arthropathy: Secondary | ICD-10-CM | POA: Diagnosis not present

## 2015-08-27 DIAGNOSIS — R1013 Epigastric pain: Secondary | ICD-10-CM | POA: Diagnosis not present

## 2015-08-27 DIAGNOSIS — N186 End stage renal disease: Secondary | ICD-10-CM | POA: Diagnosis not present

## 2015-08-27 DIAGNOSIS — J45909 Unspecified asthma, uncomplicated: Secondary | ICD-10-CM | POA: Diagnosis not present

## 2015-08-27 DIAGNOSIS — I420 Dilated cardiomyopathy: Secondary | ICD-10-CM | POA: Diagnosis not present

## 2015-08-28 DIAGNOSIS — R1013 Epigastric pain: Secondary | ICD-10-CM | POA: Diagnosis not present

## 2015-08-28 DIAGNOSIS — I2510 Atherosclerotic heart disease of native coronary artery without angina pectoris: Secondary | ICD-10-CM | POA: Diagnosis not present

## 2015-08-28 DIAGNOSIS — J449 Chronic obstructive pulmonary disease, unspecified: Secondary | ICD-10-CM | POA: Diagnosis not present

## 2015-08-28 DIAGNOSIS — E1122 Type 2 diabetes mellitus with diabetic chronic kidney disease: Secondary | ICD-10-CM | POA: Diagnosis not present

## 2015-08-28 DIAGNOSIS — Z7984 Long term (current) use of oral hypoglycemic drugs: Secondary | ICD-10-CM | POA: Diagnosis not present

## 2015-08-28 DIAGNOSIS — Z7982 Long term (current) use of aspirin: Secondary | ICD-10-CM | POA: Diagnosis not present

## 2015-08-28 DIAGNOSIS — J45909 Unspecified asthma, uncomplicated: Secondary | ICD-10-CM | POA: Diagnosis not present

## 2015-08-28 DIAGNOSIS — I10 Essential (primary) hypertension: Secondary | ICD-10-CM | POA: Diagnosis not present

## 2015-08-28 DIAGNOSIS — G589 Mononeuropathy, unspecified: Secondary | ICD-10-CM | POA: Diagnosis not present

## 2015-08-28 DIAGNOSIS — Z794 Long term (current) use of insulin: Secondary | ICD-10-CM | POA: Diagnosis not present

## 2015-08-28 DIAGNOSIS — Z992 Dependence on renal dialysis: Secondary | ICD-10-CM | POA: Diagnosis not present

## 2015-08-28 DIAGNOSIS — Z79899 Other long term (current) drug therapy: Secondary | ICD-10-CM | POA: Diagnosis not present

## 2015-08-28 DIAGNOSIS — N186 End stage renal disease: Secondary | ICD-10-CM | POA: Diagnosis not present

## 2015-08-28 DIAGNOSIS — Z9111 Patient's noncompliance with dietary regimen: Secondary | ICD-10-CM | POA: Diagnosis not present

## 2015-08-28 DIAGNOSIS — D631 Anemia in chronic kidney disease: Secondary | ICD-10-CM | POA: Diagnosis not present

## 2015-08-28 DIAGNOSIS — E11618 Type 2 diabetes mellitus with other diabetic arthropathy: Secondary | ICD-10-CM | POA: Diagnosis not present

## 2015-08-28 DIAGNOSIS — F17210 Nicotine dependence, cigarettes, uncomplicated: Secondary | ICD-10-CM | POA: Diagnosis not present

## 2015-08-28 DIAGNOSIS — I509 Heart failure, unspecified: Secondary | ICD-10-CM | POA: Diagnosis not present

## 2015-08-28 DIAGNOSIS — I132 Hypertensive heart and chronic kidney disease with heart failure and with stage 5 chronic kidney disease, or end stage renal disease: Secondary | ICD-10-CM | POA: Diagnosis not present

## 2015-08-28 DIAGNOSIS — K219 Gastro-esophageal reflux disease without esophagitis: Secondary | ICD-10-CM | POA: Diagnosis not present

## 2015-08-28 DIAGNOSIS — I420 Dilated cardiomyopathy: Secondary | ICD-10-CM | POA: Diagnosis not present

## 2015-08-28 DIAGNOSIS — T82898A Other specified complication of vascular prosthetic devices, implants and grafts, initial encounter: Secondary | ICD-10-CM | POA: Diagnosis not present

## 2015-08-28 DIAGNOSIS — E785 Hyperlipidemia, unspecified: Secondary | ICD-10-CM | POA: Diagnosis not present

## 2015-08-29 DIAGNOSIS — I509 Heart failure, unspecified: Secondary | ICD-10-CM | POA: Diagnosis not present

## 2015-08-29 DIAGNOSIS — J449 Chronic obstructive pulmonary disease, unspecified: Secondary | ICD-10-CM | POA: Diagnosis not present

## 2015-08-29 DIAGNOSIS — K219 Gastro-esophageal reflux disease without esophagitis: Secondary | ICD-10-CM | POA: Diagnosis not present

## 2015-08-29 DIAGNOSIS — R1013 Epigastric pain: Secondary | ICD-10-CM | POA: Diagnosis not present

## 2015-08-29 DIAGNOSIS — N186 End stage renal disease: Secondary | ICD-10-CM | POA: Diagnosis not present

## 2015-08-29 DIAGNOSIS — I10 Essential (primary) hypertension: Secondary | ICD-10-CM | POA: Diagnosis not present

## 2015-08-29 DIAGNOSIS — Z79899 Other long term (current) drug therapy: Secondary | ICD-10-CM | POA: Diagnosis not present

## 2015-08-29 DIAGNOSIS — D631 Anemia in chronic kidney disease: Secondary | ICD-10-CM | POA: Diagnosis not present

## 2015-08-29 DIAGNOSIS — Z7984 Long term (current) use of oral hypoglycemic drugs: Secondary | ICD-10-CM | POA: Diagnosis not present

## 2015-08-29 DIAGNOSIS — T82898A Other specified complication of vascular prosthetic devices, implants and grafts, initial encounter: Secondary | ICD-10-CM | POA: Diagnosis not present

## 2015-08-29 DIAGNOSIS — Z992 Dependence on renal dialysis: Secondary | ICD-10-CM | POA: Diagnosis not present

## 2015-08-29 DIAGNOSIS — Z7982 Long term (current) use of aspirin: Secondary | ICD-10-CM | POA: Diagnosis not present

## 2015-08-29 DIAGNOSIS — G589 Mononeuropathy, unspecified: Secondary | ICD-10-CM | POA: Diagnosis not present

## 2015-08-29 DIAGNOSIS — I132 Hypertensive heart and chronic kidney disease with heart failure and with stage 5 chronic kidney disease, or end stage renal disease: Secondary | ICD-10-CM | POA: Diagnosis not present

## 2015-08-29 DIAGNOSIS — I420 Dilated cardiomyopathy: Secondary | ICD-10-CM | POA: Diagnosis not present

## 2015-08-29 DIAGNOSIS — E785 Hyperlipidemia, unspecified: Secondary | ICD-10-CM | POA: Diagnosis not present

## 2015-08-29 DIAGNOSIS — I2510 Atherosclerotic heart disease of native coronary artery without angina pectoris: Secondary | ICD-10-CM | POA: Diagnosis not present

## 2015-08-29 DIAGNOSIS — J45909 Unspecified asthma, uncomplicated: Secondary | ICD-10-CM | POA: Diagnosis not present

## 2015-08-29 DIAGNOSIS — E11618 Type 2 diabetes mellitus with other diabetic arthropathy: Secondary | ICD-10-CM | POA: Diagnosis not present

## 2015-08-29 DIAGNOSIS — Z9111 Patient's noncompliance with dietary regimen: Secondary | ICD-10-CM | POA: Diagnosis not present

## 2015-08-29 DIAGNOSIS — E1122 Type 2 diabetes mellitus with diabetic chronic kidney disease: Secondary | ICD-10-CM | POA: Diagnosis not present

## 2015-08-29 DIAGNOSIS — Z794 Long term (current) use of insulin: Secondary | ICD-10-CM | POA: Diagnosis not present

## 2015-08-31 DIAGNOSIS — N2581 Secondary hyperparathyroidism of renal origin: Secondary | ICD-10-CM | POA: Diagnosis not present

## 2015-08-31 DIAGNOSIS — N186 End stage renal disease: Secondary | ICD-10-CM | POA: Diagnosis not present

## 2015-09-05 DIAGNOSIS — T8249XA Other complication of vascular dialysis catheter, initial encounter: Secondary | ICD-10-CM | POA: Diagnosis not present

## 2015-09-05 DIAGNOSIS — N186 End stage renal disease: Secondary | ICD-10-CM | POA: Diagnosis not present

## 2015-09-05 DIAGNOSIS — N2581 Secondary hyperparathyroidism of renal origin: Secondary | ICD-10-CM | POA: Diagnosis not present

## 2015-09-05 DIAGNOSIS — D509 Iron deficiency anemia, unspecified: Secondary | ICD-10-CM | POA: Diagnosis not present

## 2015-09-06 ENCOUNTER — Encounter: Payer: Self-pay | Admitting: *Deleted

## 2015-09-07 DIAGNOSIS — D509 Iron deficiency anemia, unspecified: Secondary | ICD-10-CM | POA: Diagnosis not present

## 2015-09-07 DIAGNOSIS — N2581 Secondary hyperparathyroidism of renal origin: Secondary | ICD-10-CM | POA: Diagnosis not present

## 2015-09-07 DIAGNOSIS — N186 End stage renal disease: Secondary | ICD-10-CM | POA: Diagnosis not present

## 2015-09-07 DIAGNOSIS — T8249XA Other complication of vascular dialysis catheter, initial encounter: Secondary | ICD-10-CM | POA: Diagnosis not present

## 2015-09-10 DIAGNOSIS — J449 Chronic obstructive pulmonary disease, unspecified: Secondary | ICD-10-CM | POA: Diagnosis not present

## 2015-09-12 DIAGNOSIS — J449 Chronic obstructive pulmonary disease, unspecified: Secondary | ICD-10-CM | POA: Diagnosis not present

## 2015-09-12 DIAGNOSIS — T8249XA Other complication of vascular dialysis catheter, initial encounter: Secondary | ICD-10-CM | POA: Diagnosis not present

## 2015-09-12 DIAGNOSIS — N2581 Secondary hyperparathyroidism of renal origin: Secondary | ICD-10-CM | POA: Diagnosis not present

## 2015-09-12 DIAGNOSIS — D509 Iron deficiency anemia, unspecified: Secondary | ICD-10-CM | POA: Diagnosis not present

## 2015-09-12 DIAGNOSIS — N186 End stage renal disease: Secondary | ICD-10-CM | POA: Diagnosis not present

## 2015-09-13 DIAGNOSIS — D631 Anemia in chronic kidney disease: Secondary | ICD-10-CM | POA: Diagnosis not present

## 2015-09-13 DIAGNOSIS — N186 End stage renal disease: Secondary | ICD-10-CM | POA: Diagnosis not present

## 2015-09-13 DIAGNOSIS — I10 Essential (primary) hypertension: Secondary | ICD-10-CM | POA: Diagnosis not present

## 2015-09-14 DIAGNOSIS — E1121 Type 2 diabetes mellitus with diabetic nephropathy: Secondary | ICD-10-CM | POA: Diagnosis not present

## 2015-09-14 DIAGNOSIS — D631 Anemia in chronic kidney disease: Secondary | ICD-10-CM | POA: Diagnosis not present

## 2015-09-14 DIAGNOSIS — D509 Iron deficiency anemia, unspecified: Secondary | ICD-10-CM | POA: Diagnosis not present

## 2015-09-14 DIAGNOSIS — N2581 Secondary hyperparathyroidism of renal origin: Secondary | ICD-10-CM | POA: Diagnosis not present

## 2015-09-14 DIAGNOSIS — N186 End stage renal disease: Secondary | ICD-10-CM | POA: Diagnosis not present

## 2015-09-14 DIAGNOSIS — K769 Liver disease, unspecified: Secondary | ICD-10-CM | POA: Diagnosis not present

## 2015-09-19 DIAGNOSIS — D631 Anemia in chronic kidney disease: Secondary | ICD-10-CM | POA: Diagnosis not present

## 2015-09-19 DIAGNOSIS — N2581 Secondary hyperparathyroidism of renal origin: Secondary | ICD-10-CM | POA: Diagnosis not present

## 2015-09-19 DIAGNOSIS — D509 Iron deficiency anemia, unspecified: Secondary | ICD-10-CM | POA: Diagnosis not present

## 2015-09-19 DIAGNOSIS — N186 End stage renal disease: Secondary | ICD-10-CM | POA: Diagnosis not present

## 2015-09-21 DIAGNOSIS — D509 Iron deficiency anemia, unspecified: Secondary | ICD-10-CM | POA: Diagnosis not present

## 2015-09-21 DIAGNOSIS — N2581 Secondary hyperparathyroidism of renal origin: Secondary | ICD-10-CM | POA: Diagnosis not present

## 2015-09-21 DIAGNOSIS — N186 End stage renal disease: Secondary | ICD-10-CM | POA: Diagnosis not present

## 2015-09-21 DIAGNOSIS — D631 Anemia in chronic kidney disease: Secondary | ICD-10-CM | POA: Diagnosis not present

## 2015-09-26 DIAGNOSIS — D509 Iron deficiency anemia, unspecified: Secondary | ICD-10-CM | POA: Diagnosis not present

## 2015-09-26 DIAGNOSIS — N186 End stage renal disease: Secondary | ICD-10-CM | POA: Diagnosis not present

## 2015-09-26 DIAGNOSIS — N2581 Secondary hyperparathyroidism of renal origin: Secondary | ICD-10-CM | POA: Diagnosis not present

## 2015-09-28 DIAGNOSIS — N186 End stage renal disease: Secondary | ICD-10-CM | POA: Diagnosis not present

## 2015-09-28 DIAGNOSIS — D509 Iron deficiency anemia, unspecified: Secondary | ICD-10-CM | POA: Diagnosis not present

## 2015-09-28 DIAGNOSIS — N2581 Secondary hyperparathyroidism of renal origin: Secondary | ICD-10-CM | POA: Diagnosis not present

## 2015-10-03 DIAGNOSIS — N2581 Secondary hyperparathyroidism of renal origin: Secondary | ICD-10-CM | POA: Diagnosis not present

## 2015-10-03 DIAGNOSIS — N186 End stage renal disease: Secondary | ICD-10-CM | POA: Diagnosis not present

## 2015-10-03 DIAGNOSIS — D509 Iron deficiency anemia, unspecified: Secondary | ICD-10-CM | POA: Diagnosis not present

## 2015-10-05 DIAGNOSIS — N186 End stage renal disease: Secondary | ICD-10-CM | POA: Diagnosis not present

## 2015-10-05 DIAGNOSIS — N2581 Secondary hyperparathyroidism of renal origin: Secondary | ICD-10-CM | POA: Diagnosis not present

## 2015-10-05 DIAGNOSIS — D509 Iron deficiency anemia, unspecified: Secondary | ICD-10-CM | POA: Diagnosis not present

## 2015-10-08 DIAGNOSIS — J449 Chronic obstructive pulmonary disease, unspecified: Secondary | ICD-10-CM | POA: Diagnosis not present

## 2015-10-10 DIAGNOSIS — D509 Iron deficiency anemia, unspecified: Secondary | ICD-10-CM | POA: Diagnosis not present

## 2015-10-10 DIAGNOSIS — N186 End stage renal disease: Secondary | ICD-10-CM | POA: Diagnosis not present

## 2015-10-10 DIAGNOSIS — N2581 Secondary hyperparathyroidism of renal origin: Secondary | ICD-10-CM | POA: Diagnosis not present

## 2015-10-11 DIAGNOSIS — J449 Chronic obstructive pulmonary disease, unspecified: Secondary | ICD-10-CM | POA: Diagnosis not present

## 2015-10-12 DIAGNOSIS — Z7982 Long term (current) use of aspirin: Secondary | ICD-10-CM | POA: Diagnosis not present

## 2015-10-12 DIAGNOSIS — R4182 Altered mental status, unspecified: Secondary | ICD-10-CM | POA: Diagnosis not present

## 2015-10-12 DIAGNOSIS — Z992 Dependence on renal dialysis: Secondary | ICD-10-CM | POA: Diagnosis not present

## 2015-10-12 DIAGNOSIS — I120 Hypertensive chronic kidney disease with stage 5 chronic kidney disease or end stage renal disease: Secondary | ICD-10-CM | POA: Diagnosis not present

## 2015-10-12 DIAGNOSIS — Z794 Long term (current) use of insulin: Secondary | ICD-10-CM | POA: Diagnosis not present

## 2015-10-12 DIAGNOSIS — R402411 Glasgow coma scale score 13-15, in the field [EMT or ambulance]: Secondary | ICD-10-CM | POA: Diagnosis not present

## 2015-10-12 DIAGNOSIS — D631 Anemia in chronic kidney disease: Secondary | ICD-10-CM | POA: Diagnosis not present

## 2015-10-12 DIAGNOSIS — G9341 Metabolic encephalopathy: Secondary | ICD-10-CM | POA: Diagnosis not present

## 2015-10-12 DIAGNOSIS — I10 Essential (primary) hypertension: Secondary | ICD-10-CM | POA: Diagnosis not present

## 2015-10-12 DIAGNOSIS — E11649 Type 2 diabetes mellitus with hypoglycemia without coma: Secondary | ICD-10-CM | POA: Diagnosis not present

## 2015-10-12 DIAGNOSIS — I420 Dilated cardiomyopathy: Secondary | ICD-10-CM | POA: Diagnosis not present

## 2015-10-12 DIAGNOSIS — J811 Chronic pulmonary edema: Secondary | ICD-10-CM | POA: Diagnosis not present

## 2015-10-12 DIAGNOSIS — N183 Chronic kidney disease, stage 3 (moderate): Secondary | ICD-10-CM | POA: Diagnosis not present

## 2015-10-12 DIAGNOSIS — E118 Type 2 diabetes mellitus with unspecified complications: Secondary | ICD-10-CM | POA: Diagnosis not present

## 2015-10-12 DIAGNOSIS — N2581 Secondary hyperparathyroidism of renal origin: Secondary | ICD-10-CM | POA: Diagnosis not present

## 2015-10-12 DIAGNOSIS — I509 Heart failure, unspecified: Secondary | ICD-10-CM | POA: Diagnosis not present

## 2015-10-12 DIAGNOSIS — Z7951 Long term (current) use of inhaled steroids: Secondary | ICD-10-CM | POA: Diagnosis not present

## 2015-10-12 DIAGNOSIS — E1122 Type 2 diabetes mellitus with diabetic chronic kidney disease: Secondary | ICD-10-CM | POA: Diagnosis not present

## 2015-10-12 DIAGNOSIS — I132 Hypertensive heart and chronic kidney disease with heart failure and with stage 5 chronic kidney disease, or end stage renal disease: Secondary | ICD-10-CM | POA: Diagnosis not present

## 2015-10-12 DIAGNOSIS — E161 Other hypoglycemia: Secondary | ICD-10-CM | POA: Diagnosis not present

## 2015-10-12 DIAGNOSIS — Z79899 Other long term (current) drug therapy: Secondary | ICD-10-CM | POA: Diagnosis not present

## 2015-10-12 DIAGNOSIS — J449 Chronic obstructive pulmonary disease, unspecified: Secondary | ICD-10-CM | POA: Diagnosis not present

## 2015-10-12 DIAGNOSIS — N186 End stage renal disease: Secondary | ICD-10-CM | POA: Diagnosis not present

## 2015-10-12 DIAGNOSIS — D509 Iron deficiency anemia, unspecified: Secondary | ICD-10-CM | POA: Diagnosis not present

## 2015-10-13 DIAGNOSIS — E1122 Type 2 diabetes mellitus with diabetic chronic kidney disease: Secondary | ICD-10-CM | POA: Diagnosis not present

## 2015-10-13 DIAGNOSIS — Z7951 Long term (current) use of inhaled steroids: Secondary | ICD-10-CM | POA: Diagnosis not present

## 2015-10-13 DIAGNOSIS — Z794 Long term (current) use of insulin: Secondary | ICD-10-CM | POA: Diagnosis not present

## 2015-10-13 DIAGNOSIS — I420 Dilated cardiomyopathy: Secondary | ICD-10-CM | POA: Diagnosis not present

## 2015-10-13 DIAGNOSIS — G9341 Metabolic encephalopathy: Secondary | ICD-10-CM | POA: Diagnosis not present

## 2015-10-13 DIAGNOSIS — Z7982 Long term (current) use of aspirin: Secondary | ICD-10-CM | POA: Diagnosis not present

## 2015-10-13 DIAGNOSIS — E11649 Type 2 diabetes mellitus with hypoglycemia without coma: Secondary | ICD-10-CM | POA: Diagnosis not present

## 2015-10-13 DIAGNOSIS — I132 Hypertensive heart and chronic kidney disease with heart failure and with stage 5 chronic kidney disease, or end stage renal disease: Secondary | ICD-10-CM | POA: Diagnosis not present

## 2015-10-13 DIAGNOSIS — Z79899 Other long term (current) drug therapy: Secondary | ICD-10-CM | POA: Diagnosis not present

## 2015-10-13 DIAGNOSIS — J449 Chronic obstructive pulmonary disease, unspecified: Secondary | ICD-10-CM | POA: Diagnosis not present

## 2015-10-13 DIAGNOSIS — N186 End stage renal disease: Secondary | ICD-10-CM | POA: Diagnosis not present

## 2015-10-13 DIAGNOSIS — Z992 Dependence on renal dialysis: Secondary | ICD-10-CM | POA: Diagnosis not present

## 2015-10-13 DIAGNOSIS — I509 Heart failure, unspecified: Secondary | ICD-10-CM | POA: Diagnosis not present

## 2015-10-14 DIAGNOSIS — Z7951 Long term (current) use of inhaled steroids: Secondary | ICD-10-CM | POA: Diagnosis not present

## 2015-10-14 DIAGNOSIS — I10 Essential (primary) hypertension: Secondary | ICD-10-CM | POA: Diagnosis not present

## 2015-10-14 DIAGNOSIS — E11649 Type 2 diabetes mellitus with hypoglycemia without coma: Secondary | ICD-10-CM | POA: Diagnosis not present

## 2015-10-14 DIAGNOSIS — I420 Dilated cardiomyopathy: Secondary | ICD-10-CM | POA: Diagnosis not present

## 2015-10-14 DIAGNOSIS — Z794 Long term (current) use of insulin: Secondary | ICD-10-CM | POA: Diagnosis not present

## 2015-10-14 DIAGNOSIS — E1122 Type 2 diabetes mellitus with diabetic chronic kidney disease: Secondary | ICD-10-CM | POA: Diagnosis not present

## 2015-10-14 DIAGNOSIS — Z992 Dependence on renal dialysis: Secondary | ICD-10-CM | POA: Diagnosis not present

## 2015-10-14 DIAGNOSIS — Z79899 Other long term (current) drug therapy: Secondary | ICD-10-CM | POA: Diagnosis not present

## 2015-10-14 DIAGNOSIS — D631 Anemia in chronic kidney disease: Secondary | ICD-10-CM | POA: Diagnosis not present

## 2015-10-14 DIAGNOSIS — G9341 Metabolic encephalopathy: Secondary | ICD-10-CM | POA: Diagnosis not present

## 2015-10-14 DIAGNOSIS — I132 Hypertensive heart and chronic kidney disease with heart failure and with stage 5 chronic kidney disease, or end stage renal disease: Secondary | ICD-10-CM | POA: Diagnosis not present

## 2015-10-14 DIAGNOSIS — Z7982 Long term (current) use of aspirin: Secondary | ICD-10-CM | POA: Diagnosis not present

## 2015-10-14 DIAGNOSIS — N186 End stage renal disease: Secondary | ICD-10-CM | POA: Diagnosis not present

## 2015-10-14 DIAGNOSIS — I509 Heart failure, unspecified: Secondary | ICD-10-CM | POA: Diagnosis not present

## 2015-10-14 DIAGNOSIS — J449 Chronic obstructive pulmonary disease, unspecified: Secondary | ICD-10-CM | POA: Diagnosis not present

## 2015-10-17 DIAGNOSIS — N2581 Secondary hyperparathyroidism of renal origin: Secondary | ICD-10-CM | POA: Diagnosis not present

## 2015-10-17 DIAGNOSIS — N186 End stage renal disease: Secondary | ICD-10-CM | POA: Diagnosis not present

## 2015-10-17 DIAGNOSIS — D631 Anemia in chronic kidney disease: Secondary | ICD-10-CM | POA: Diagnosis not present

## 2015-10-19 DIAGNOSIS — K769 Liver disease, unspecified: Secondary | ICD-10-CM | POA: Diagnosis not present

## 2015-10-19 DIAGNOSIS — N186 End stage renal disease: Secondary | ICD-10-CM | POA: Diagnosis not present

## 2015-10-19 DIAGNOSIS — E1121 Type 2 diabetes mellitus with diabetic nephropathy: Secondary | ICD-10-CM | POA: Diagnosis not present

## 2015-10-19 DIAGNOSIS — N2581 Secondary hyperparathyroidism of renal origin: Secondary | ICD-10-CM | POA: Diagnosis not present

## 2015-10-19 DIAGNOSIS — D631 Anemia in chronic kidney disease: Secondary | ICD-10-CM | POA: Diagnosis not present

## 2015-11-08 DIAGNOSIS — J449 Chronic obstructive pulmonary disease, unspecified: Secondary | ICD-10-CM | POA: Diagnosis not present

## 2015-11-11 DIAGNOSIS — J449 Chronic obstructive pulmonary disease, unspecified: Secondary | ICD-10-CM | POA: Diagnosis not present

## 2015-11-13 DIAGNOSIS — 419620001 Death: Secondary | SNOMED CT | POA: Diagnosis not present

## 2015-11-13 DEATH — deceased

## 2016-06-22 IMAGING — CR DG CHEST 2V
2 series · 2 of 2 positions shown · non-contrast
Comparison: Chest radiograph June 02, 2014

CLINICAL DATA: RIGHT lower chest pain, shortness of breath and
fatigue.

EXAM:
CHEST  2 VIEW

[chest pa]
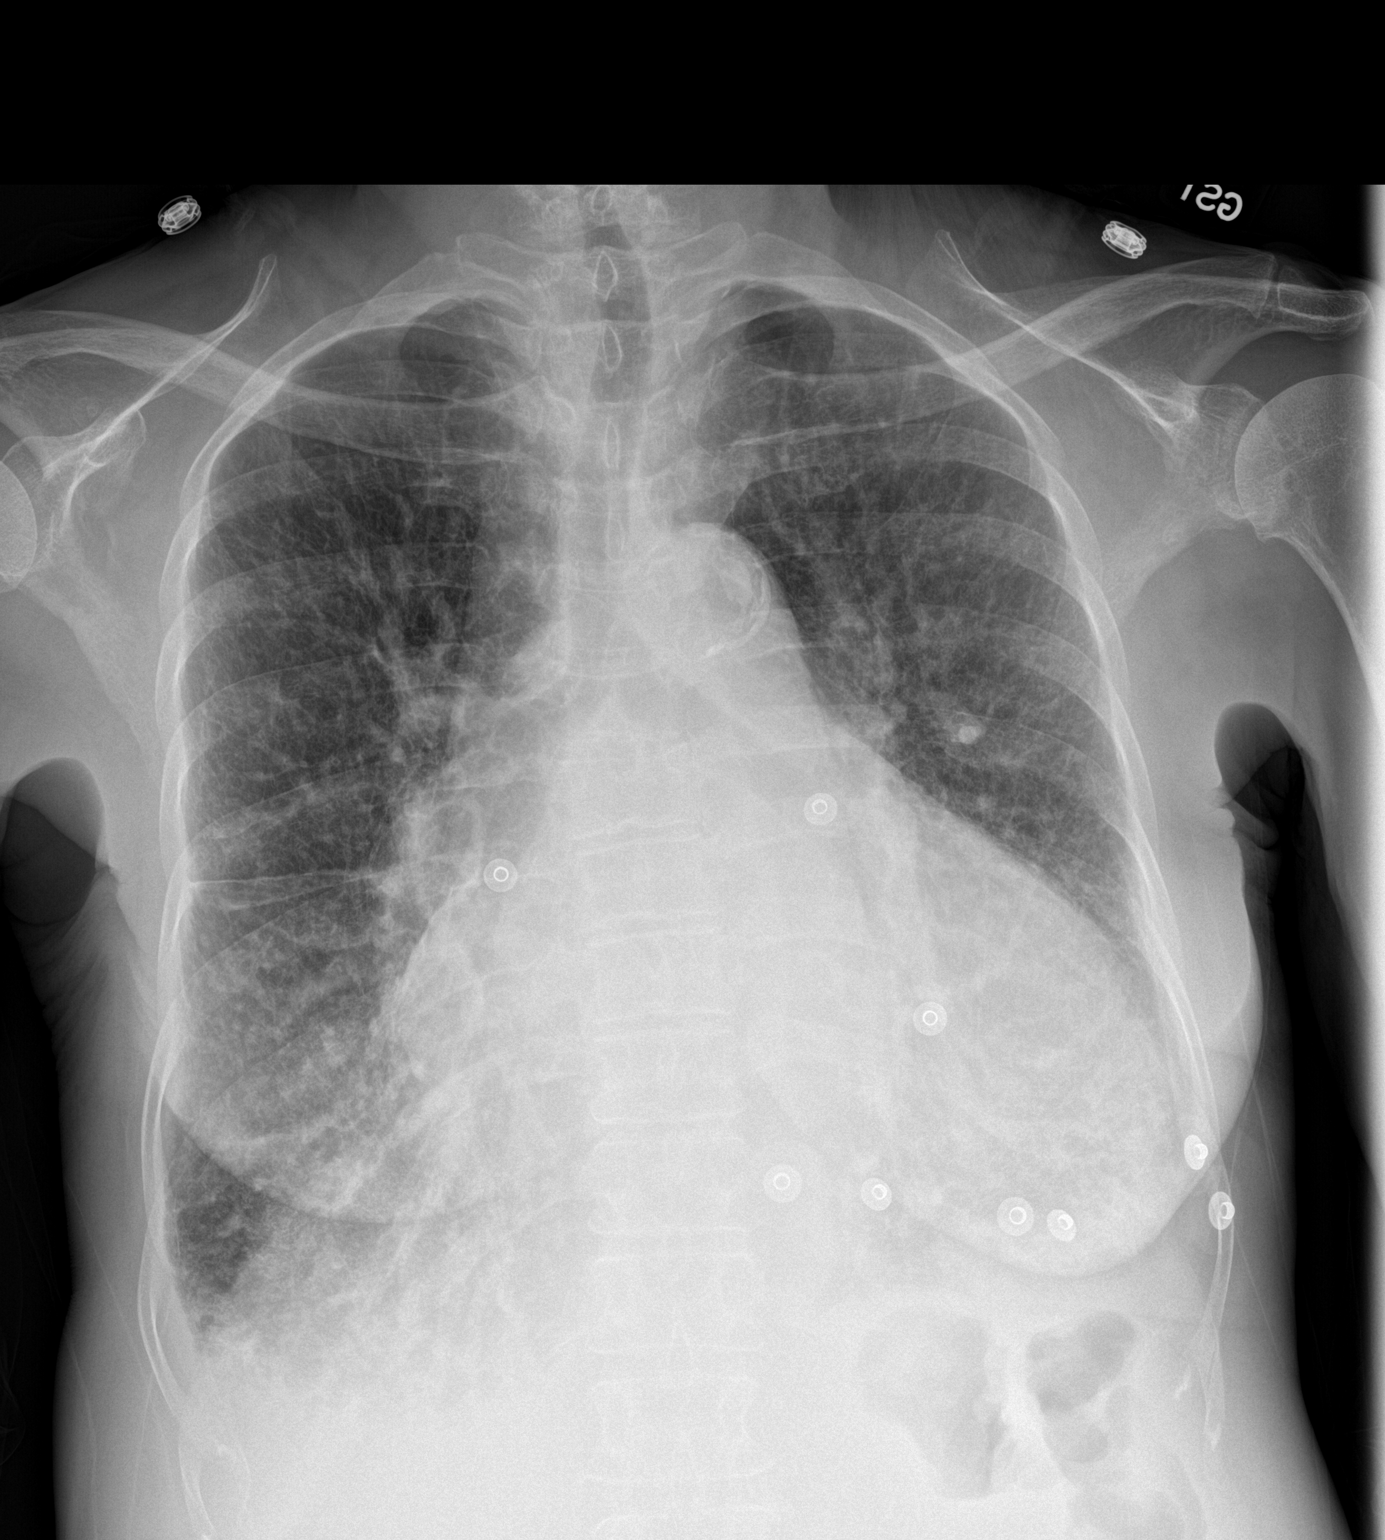

[chest lat]
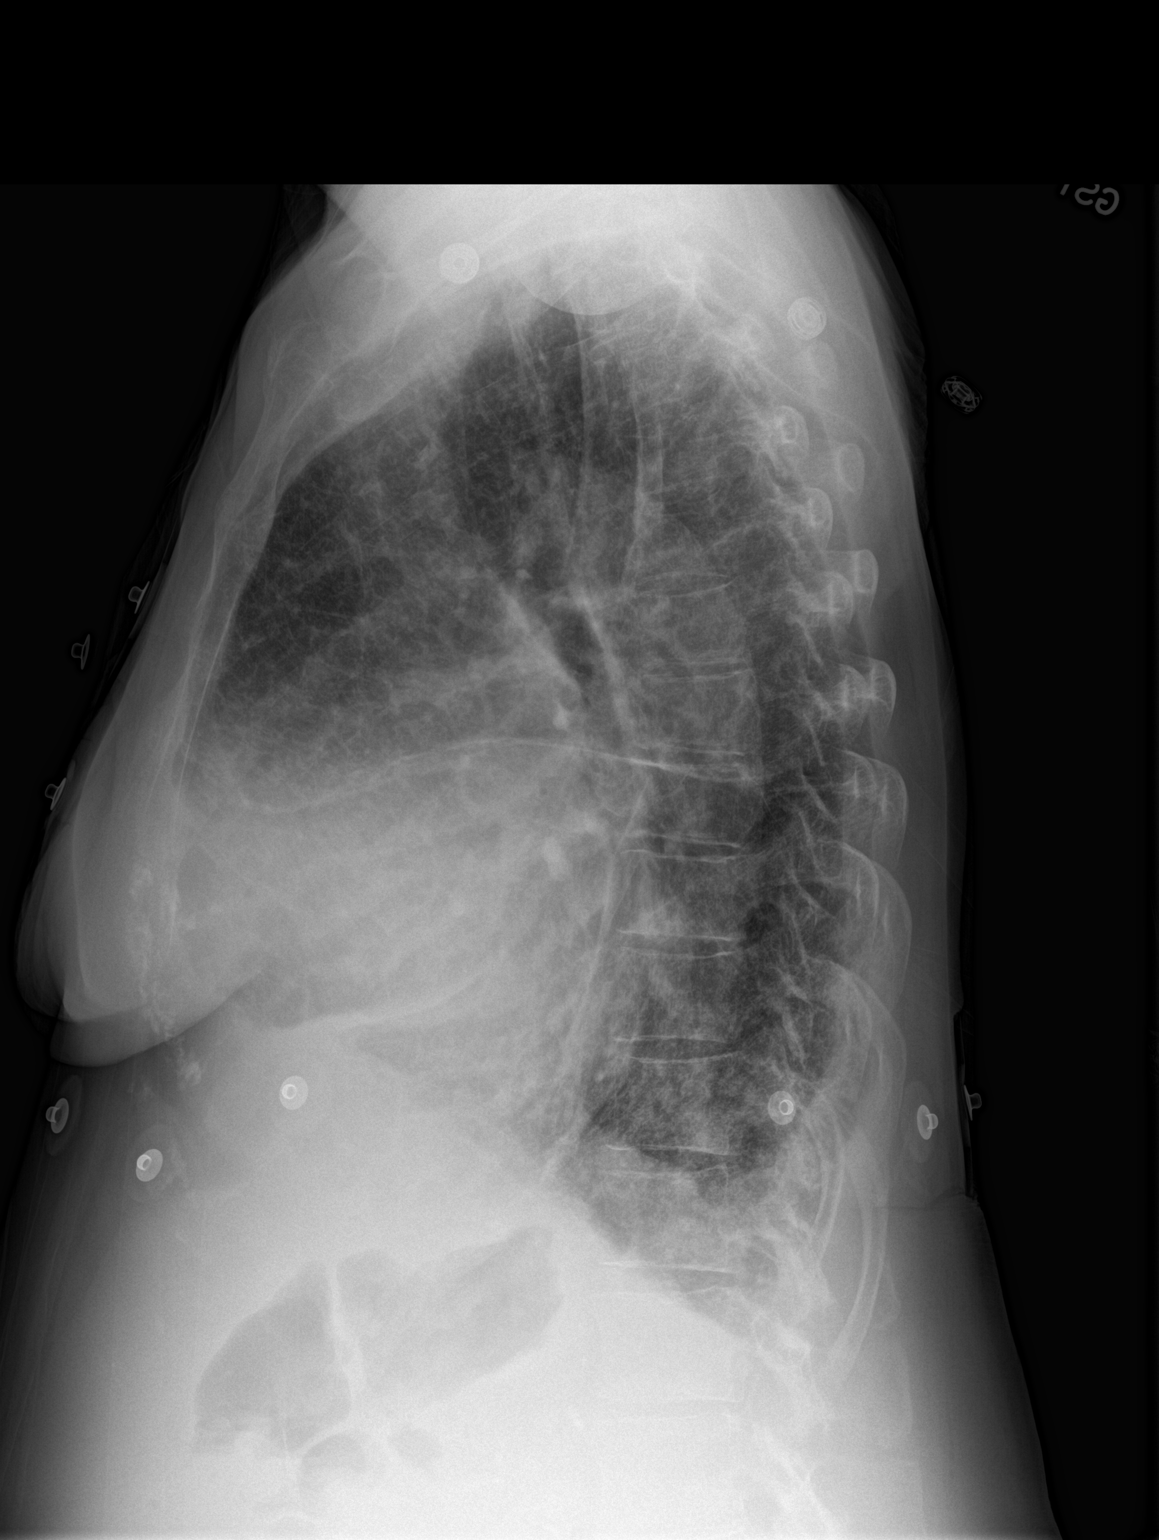

[2 of 2 positions shown; findings below may reference images not displayed]

FINDINGS: The cardiac silhouette appears moderate to severely enlarged,
similar. Heavily calcified aortic knob. Similar interstitial
prominence. Small pleural effusions. Patchy bibasilar airspace
opacities, slightly decreased. No pneumothorax.

Soft tissue planes and included osseous structures are
nonsuspicious. Mild degenerative change of thoracic spine. Linear
calcification within the RIGHT neck are likely vascular.
IMPRESSION: Stable cardiomegaly with interstitial prominence most consistent
with pulmonary edema. Improved aeration of the lung bases with small
pleural effusions.

  By: Ferienhaus Erxleben

## 2016-08-03 IMAGING — DX DG CHEST 2V
2 series · 2 of 2 positions shown · non-contrast
Comparison: 07/31/2014

CLINICAL DATA: Dyspnea.

EXAM:
CHEST  2 VIEW

[chest lat]
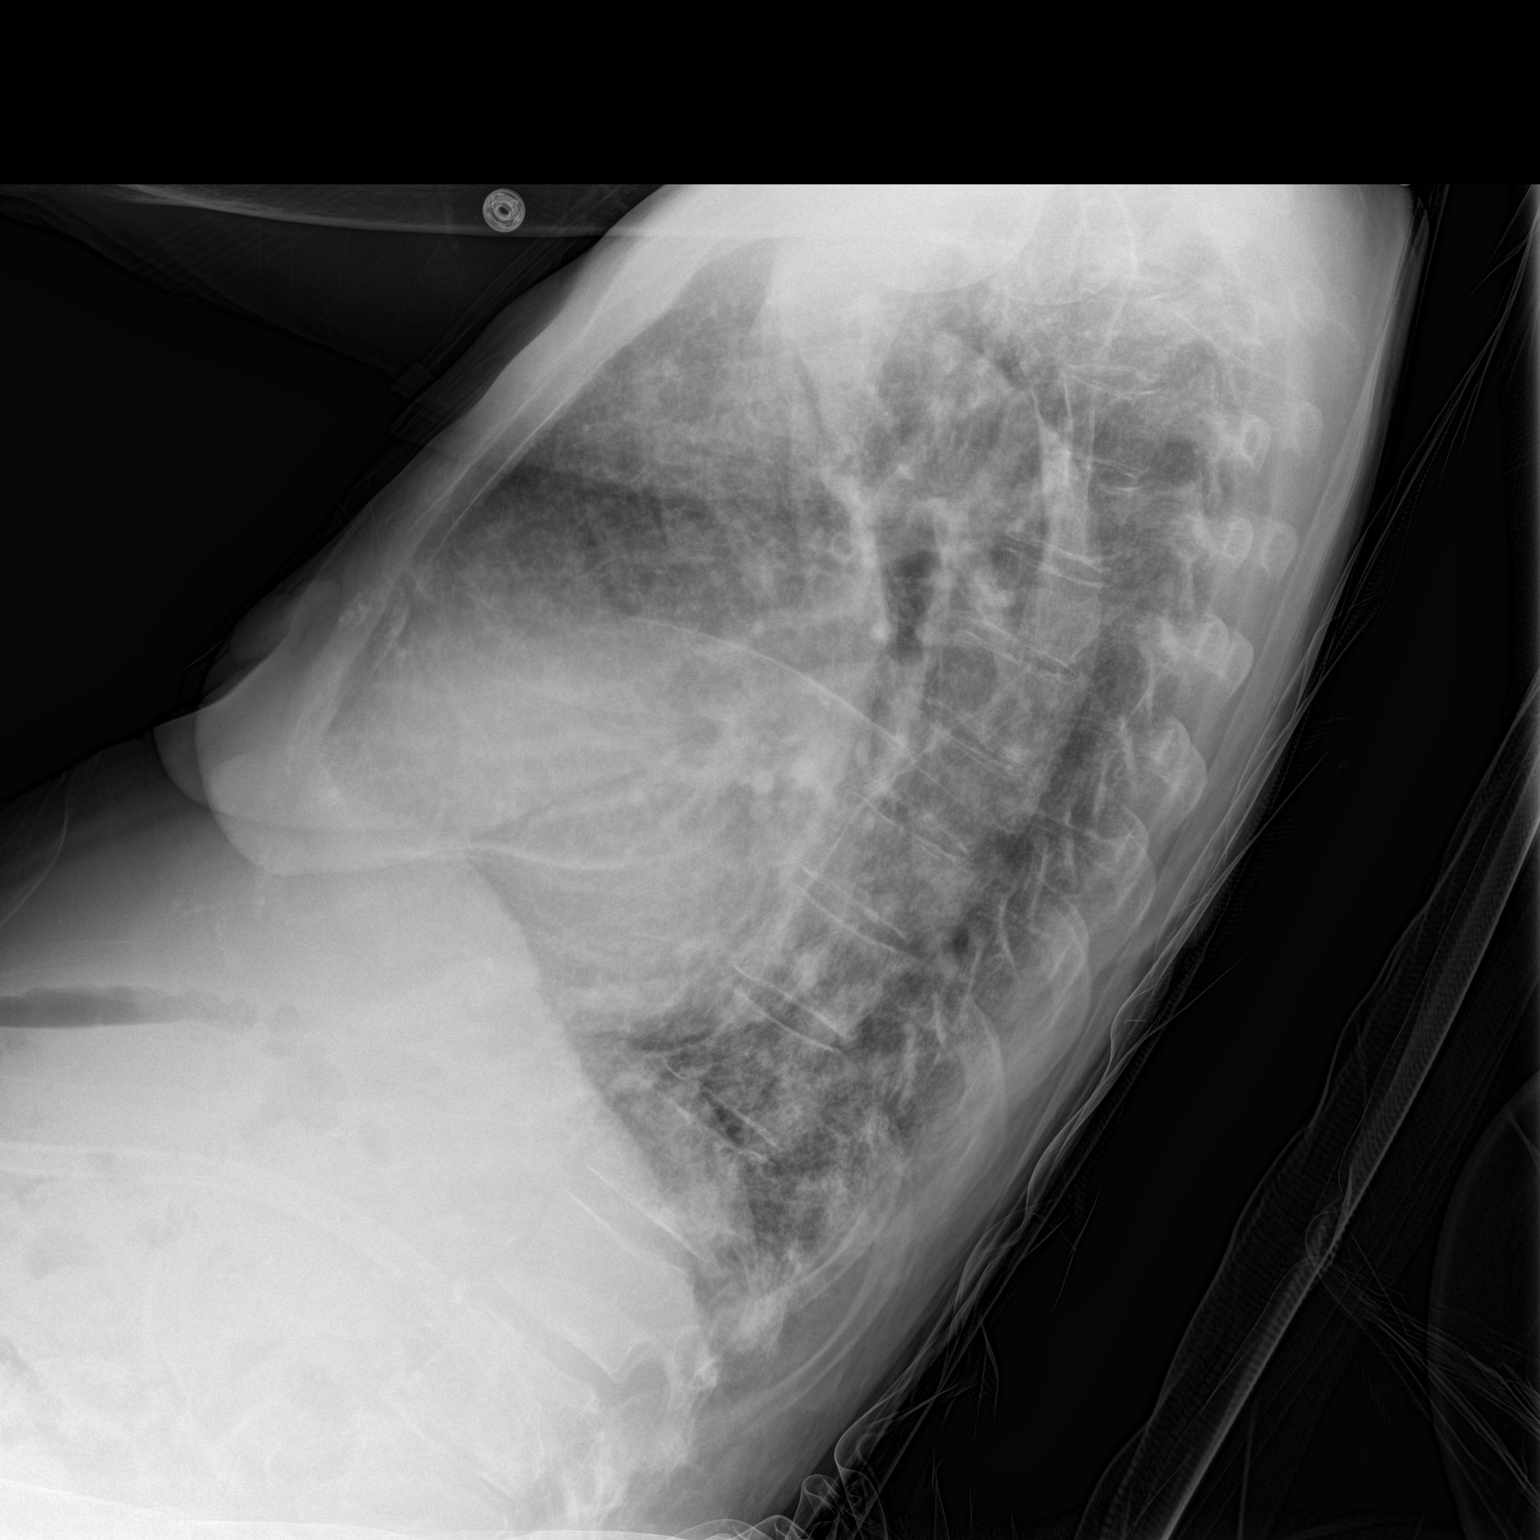

[chest ap]
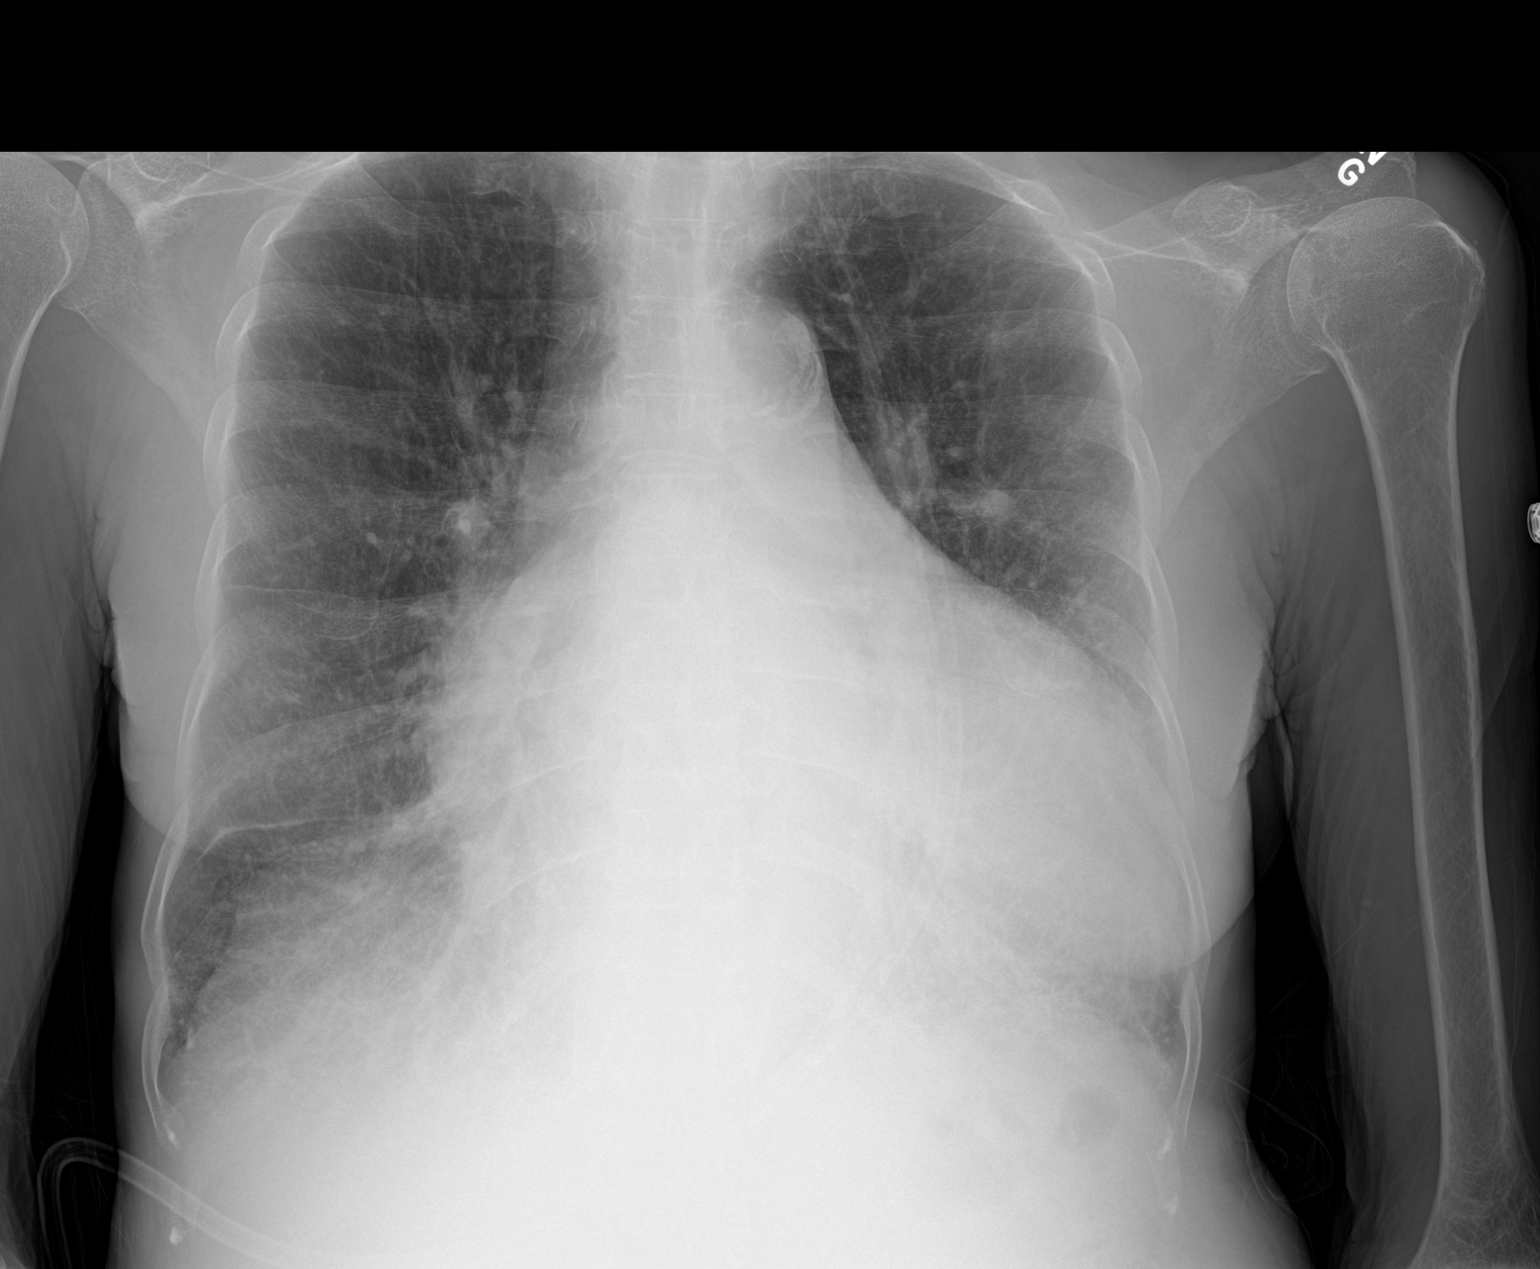

[2 of 2 positions shown; findings below may reference images not displayed]

FINDINGS: Exam demonstrates adequate lung volumes with mild stable
interstitial prominence over the lung bases. Interval improvement in
the previously mild vascular congestion. No focal consolidation or
effusion. There is moderate stable cardiomegaly. There is moderate
calcified plaque over the aortic arch.
IMPRESSION: Interval improvement in previously seen mild vascular congestion.

Stable moderate cardiomegaly.

Stable mild bibasilar interstitial changes.

## 2017-04-23 IMAGING — CR DG CHEST 1V PORT
1 series · 1 of 1 positions shown · non-contrast
Comparison: 04/22/2015

CLINICAL DATA: Shortness of breath, chest pain.

EXAM:
PORTABLE CHEST 1 VIEW

[AP]
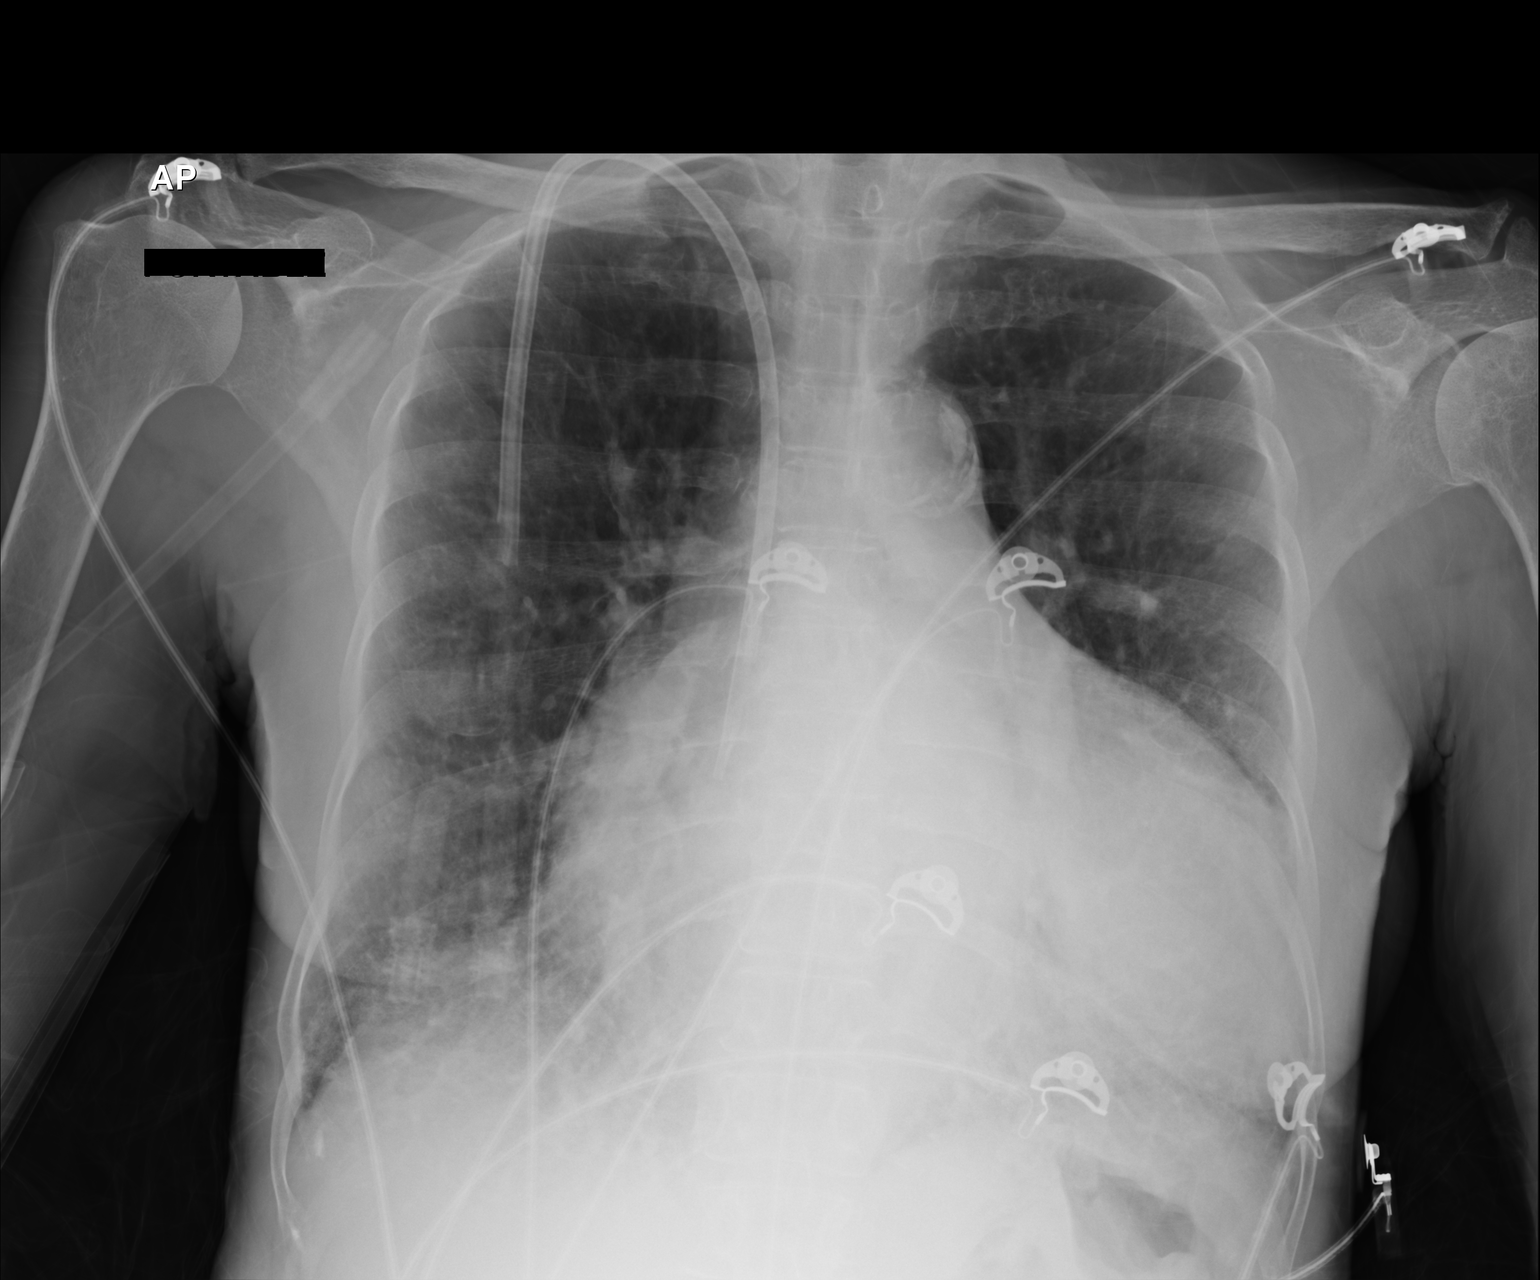

[1 of 1 positions shown; findings below may reference images not displayed]

FINDINGS: Dual lumen right central venous catheter has unchanged, tip at the
expected location of superior vena cava/ right atrium.

Cardiomediastinal silhouette is markedly enlarged. Mediastinal
contours appear intact. Aortic knob calcifications are noted.

There is no evidence of focal airspace consolidation or
pneumothorax. Left pleural effusion cannot be excluded, as the left
costophrenic angle is obscured. There is mild prominence of the
interstitial markings.

Osseous structures are without acute abnormality. Soft tissues are
grossly normal.
IMPRESSION: Marked enlargement of the cardiac silhouette. This may represent
cardiomegaly and/ or pericardial effusion.

Mild increase of the interstitial markings, likely due to mild
pulmonary edema.

Left pleural effusion cannot be excluded, as the left costophrenic
angle is obscured.
